# Patient Record
Sex: Female | Born: 1945 | Race: White | Hispanic: No | Marital: Married | Smoking: Former smoker
Health system: Southern US, Community
[De-identification: ages and names within clinical notes are randomized; demographics above are authoritative.]

## PROBLEM LIST (undated history)

## (undated) DIAGNOSIS — F32A Depression, unspecified: Secondary | ICD-10-CM

## (undated) DIAGNOSIS — I2510 Atherosclerotic heart disease of native coronary artery without angina pectoris: Secondary | ICD-10-CM

## (undated) DIAGNOSIS — I350 Nonrheumatic aortic (valve) stenosis: Secondary | ICD-10-CM

## (undated) DIAGNOSIS — I340 Nonrheumatic mitral (valve) insufficiency: Secondary | ICD-10-CM

## (undated) DIAGNOSIS — Z952 Presence of prosthetic heart valve: Secondary | ICD-10-CM

## (undated) DIAGNOSIS — E7800 Pure hypercholesterolemia, unspecified: Secondary | ICD-10-CM

## (undated) DIAGNOSIS — K5792 Diverticulitis of intestine, part unspecified, without perforation or abscess without bleeding: Secondary | ICD-10-CM

## (undated) DIAGNOSIS — R002 Palpitations: Secondary | ICD-10-CM

## (undated) DIAGNOSIS — E785 Hyperlipidemia, unspecified: Secondary | ICD-10-CM

## (undated) DIAGNOSIS — E079 Disorder of thyroid, unspecified: Secondary | ICD-10-CM

## (undated) DIAGNOSIS — M1990 Unspecified osteoarthritis, unspecified site: Secondary | ICD-10-CM

## (undated) DIAGNOSIS — M8580 Other specified disorders of bone density and structure, unspecified site: Secondary | ICD-10-CM

## (undated) DIAGNOSIS — D249 Benign neoplasm of unspecified breast: Secondary | ICD-10-CM

## (undated) DIAGNOSIS — I351 Nonrheumatic aortic (valve) insufficiency: Secondary | ICD-10-CM

## (undated) DIAGNOSIS — F329 Major depressive disorder, single episode, unspecified: Secondary | ICD-10-CM

## (undated) DIAGNOSIS — F419 Anxiety disorder, unspecified: Secondary | ICD-10-CM

## (undated) DIAGNOSIS — R011 Cardiac murmur, unspecified: Secondary | ICD-10-CM

## (undated) HISTORY — DX: Diverticulitis of intestine, part unspecified, without perforation or abscess without bleeding: K57.92

## (undated) HISTORY — PX: TUBAL LIGATION: SHX77

## (undated) HISTORY — PX: CORONARY ANGIOPLASTY: SHX604

## (undated) HISTORY — DX: Other specified disorders of bone density and structure, unspecified site: M85.80

## (undated) HISTORY — PX: CHOLECYSTECTOMY: SHX55

## (undated) HISTORY — DX: Benign neoplasm of unspecified breast: D24.9

## (undated) HISTORY — PX: BREAST SURGERY: SHX581

## (undated) HISTORY — PX: FINGER SURGERY: SHX640

## (undated) HISTORY — PX: BOWEL RESECTION: SHX1257

## (undated) HISTORY — DX: Nonrheumatic mitral (valve) insufficiency: I34.0

## (undated) HISTORY — DX: Pure hypercholesterolemia, unspecified: E78.00

## (undated) HISTORY — PX: COLOSTOMY TAKEDOWN: SHX5258

## (undated) HISTORY — DX: Nonrheumatic aortic (valve) stenosis: I35.0

## (undated) HISTORY — DX: Atherosclerotic heart disease of native coronary artery without angina pectoris: I25.10

## (undated) HISTORY — DX: Hyperlipidemia, unspecified: E78.5

## (undated) HISTORY — DX: Depression, unspecified: F32.A

## (undated) HISTORY — DX: Major depressive disorder, single episode, unspecified: F32.9

## (undated) HISTORY — DX: Nonrheumatic aortic (valve) insufficiency: I35.1

---
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
## 1998-09-07 ENCOUNTER — Other Ambulatory Visit: Admission: RE | Admit: 1998-09-07 | Discharge: 1998-09-07 | Payer: Self-pay | Admitting: Obstetrics and Gynecology

## 1999-09-11 ENCOUNTER — Other Ambulatory Visit: Admission: RE | Admit: 1999-09-11 | Discharge: 1999-09-11 | Payer: Self-pay | Admitting: Obstetrics and Gynecology

## 1999-10-09 ENCOUNTER — Other Ambulatory Visit: Admission: RE | Admit: 1999-10-09 | Discharge: 1999-10-09 | Payer: Self-pay | Admitting: Obstetrics and Gynecology

## 2000-09-11 ENCOUNTER — Other Ambulatory Visit: Admission: RE | Admit: 2000-09-11 | Discharge: 2000-09-11 | Payer: Self-pay | Admitting: Obstetrics and Gynecology

## 2001-09-12 ENCOUNTER — Other Ambulatory Visit: Admission: RE | Admit: 2001-09-12 | Discharge: 2001-09-12 | Payer: Self-pay | Admitting: Obstetrics and Gynecology

## 2002-09-15 ENCOUNTER — Other Ambulatory Visit: Admission: RE | Admit: 2002-09-15 | Discharge: 2002-09-15 | Payer: Self-pay | Admitting: Obstetrics and Gynecology

## 2003-10-06 ENCOUNTER — Other Ambulatory Visit: Admission: RE | Admit: 2003-10-06 | Discharge: 2003-10-06 | Payer: Self-pay | Admitting: Obstetrics and Gynecology

## 2004-10-09 ENCOUNTER — Other Ambulatory Visit: Admission: RE | Admit: 2004-10-09 | Discharge: 2004-10-09 | Payer: Self-pay | Admitting: Obstetrics and Gynecology

## 2005-10-10 ENCOUNTER — Other Ambulatory Visit: Admission: RE | Admit: 2005-10-10 | Discharge: 2005-10-10 | Payer: Self-pay | Admitting: Obstetrics and Gynecology

## 2006-10-14 ENCOUNTER — Other Ambulatory Visit: Admission: RE | Admit: 2006-10-14 | Discharge: 2006-10-14 | Payer: Self-pay | Admitting: Obstetrics and Gynecology

## 2007-10-15 ENCOUNTER — Other Ambulatory Visit: Admission: RE | Admit: 2007-10-15 | Discharge: 2007-10-15 | Payer: Self-pay | Admitting: Obstetrics and Gynecology

## 2008-10-20 ENCOUNTER — Ambulatory Visit: Payer: Self-pay | Admitting: Obstetrics and Gynecology

## 2008-10-20 ENCOUNTER — Encounter: Payer: Self-pay | Admitting: Obstetrics and Gynecology

## 2008-10-20 ENCOUNTER — Other Ambulatory Visit: Admission: RE | Admit: 2008-10-20 | Discharge: 2008-10-20 | Payer: Self-pay | Admitting: Obstetrics and Gynecology

## 2009-10-24 ENCOUNTER — Ambulatory Visit: Payer: Self-pay | Admitting: Obstetrics and Gynecology

## 2009-10-24 ENCOUNTER — Other Ambulatory Visit: Admission: RE | Admit: 2009-10-24 | Discharge: 2009-10-24 | Payer: Self-pay | Admitting: Obstetrics and Gynecology

## 2009-11-28 ENCOUNTER — Encounter: Admission: RE | Admit: 2009-11-28 | Discharge: 2009-11-28 | Payer: Self-pay | Admitting: General Surgery

## 2009-11-30 ENCOUNTER — Ambulatory Visit (HOSPITAL_BASED_OUTPATIENT_CLINIC_OR_DEPARTMENT_OTHER): Admission: RE | Admit: 2009-11-30 | Discharge: 2009-11-30 | Payer: Self-pay | Admitting: General Surgery

## 2010-10-02 LAB — BASIC METABOLIC PANEL
BUN: 11 mg/dL (ref 6–23)
Chloride: 105 mEq/L (ref 96–112)
Creatinine, Ser: 0.85 mg/dL (ref 0.4–1.2)
GFR calc Af Amer: >60 mL/min (ref >60)

## 2010-10-02 LAB — CBC
Hemoglobin: 13.4 g/dL (ref 12.0–15.0)
MCHC: 34.1 g/dL (ref 30.0–36.0)
MCV: 84.4 fL (ref 78.0–100.0)
Platelets: 175 10*3/uL (ref 150–400)
RBC: 4.67 MIL/uL (ref 3.87–5.11)
RDW: 14.6 % (ref 11.5–15.5)

## 2010-10-02 LAB — DIFFERENTIAL
Basophils Relative: 0 % (ref 0–1)
Eosinophils Relative: 3 % (ref 0–5)
Lymphocytes Relative: 25 % (ref 12–46)
Lymphs Abs: 2.1 10*3/uL (ref 0.7–4.0)
Monocytes Absolute: 0.6 10*3/uL (ref 0.1–1.0)
Neutro Abs: 5.1 10*3/uL (ref 1.7–7.7)
Neutrophils Relative %: 64 % (ref 43–77)

## 2010-11-01 ENCOUNTER — Encounter (INDEPENDENT_AMBULATORY_CARE_PROVIDER_SITE_OTHER): Payer: BC Managed Care – PPO | Admitting: Obstetrics and Gynecology

## 2010-11-01 ENCOUNTER — Other Ambulatory Visit (HOSPITAL_COMMUNITY)
Admission: RE | Admit: 2010-11-01 | Discharge: 2010-11-01 | Disposition: A | Discharge status: Home / Self Care | Payer: BC Managed Care – PPO | Source: Ambulatory Visit | Attending: Obstetrics and Gynecology | Admitting: Obstetrics and Gynecology

## 2010-11-01 ENCOUNTER — Other Ambulatory Visit: Payer: Self-pay | Admitting: Obstetrics and Gynecology

## 2010-11-01 DIAGNOSIS — Z833 Family history of diabetes mellitus: Secondary | ICD-10-CM

## 2010-11-01 DIAGNOSIS — E559 Vitamin D deficiency, unspecified: Secondary | ICD-10-CM

## 2010-11-01 DIAGNOSIS — Z01419 Encounter for gynecological examination (general) (routine) without abnormal findings: Secondary | ICD-10-CM

## 2010-11-01 DIAGNOSIS — Z124 Encounter for screening for malignant neoplasm of cervix: Secondary | ICD-10-CM | POA: Insufficient documentation

## 2010-11-17 ENCOUNTER — Other Ambulatory Visit: Payer: BC Managed Care – PPO

## 2011-10-22 ENCOUNTER — Other Ambulatory Visit: Payer: Self-pay | Admitting: *Deleted

## 2011-10-22 DIAGNOSIS — M8580 Other specified disorders of bone density and structure, unspecified site: Secondary | ICD-10-CM

## 2011-10-26 ENCOUNTER — Encounter: Payer: Self-pay | Admitting: Gynecology

## 2011-10-26 DIAGNOSIS — I340 Nonrheumatic mitral (valve) insufficiency: Secondary | ICD-10-CM | POA: Insufficient documentation

## 2011-10-26 DIAGNOSIS — D249 Benign neoplasm of unspecified breast: Secondary | ICD-10-CM | POA: Insufficient documentation

## 2011-10-26 DIAGNOSIS — I351 Nonrheumatic aortic (valve) insufficiency: Secondary | ICD-10-CM | POA: Insufficient documentation

## 2011-10-26 DIAGNOSIS — K5792 Diverticulitis of intestine, part unspecified, without perforation or abscess without bleeding: Secondary | ICD-10-CM | POA: Insufficient documentation

## 2011-10-26 DIAGNOSIS — M8580 Other specified disorders of bone density and structure, unspecified site: Secondary | ICD-10-CM | POA: Insufficient documentation

## 2011-10-26 DIAGNOSIS — F329 Major depressive disorder, single episode, unspecified: Secondary | ICD-10-CM | POA: Insufficient documentation

## 2011-11-07 ENCOUNTER — Other Ambulatory Visit (HOSPITAL_COMMUNITY)
Admission: RE | Admit: 2011-11-07 | Discharge: 2011-11-07 | Disposition: A | Discharge status: Home / Self Care | Payer: BC Managed Care – PPO | Source: Ambulatory Visit | Attending: Obstetrics and Gynecology | Admitting: Obstetrics and Gynecology

## 2011-11-07 ENCOUNTER — Encounter: Payer: Self-pay | Admitting: Obstetrics and Gynecology

## 2011-11-07 ENCOUNTER — Ambulatory Visit (INDEPENDENT_AMBULATORY_CARE_PROVIDER_SITE_OTHER): Payer: BC Managed Care – PPO | Admitting: Obstetrics and Gynecology

## 2011-11-07 VITALS — BP 124/80 | Ht 59.0 in | Wt 215.0 lb

## 2011-11-07 DIAGNOSIS — E7800 Pure hypercholesterolemia, unspecified: Secondary | ICD-10-CM | POA: Insufficient documentation

## 2011-11-07 DIAGNOSIS — Z01419 Encounter for gynecological examination (general) (routine) without abnormal findings: Secondary | ICD-10-CM | POA: Insufficient documentation

## 2011-11-07 NOTE — Progress Notes (Signed)
Patient came to see me today for her annual GYN exam. She is doing well without HRT. She is having no vaginal bleeding. She is having no pelvic pain. She does her lab through her PCP. She had a borderline sugar here last year. She has had 2 normal test since then. She just had a normal mammogram. She had a bone density but the report is not back yet. She is not having any GYN problems.  Physical examination: Kennon Portela present HEENT within normal limits. Neck: Thyroid not large. No masses. Supraclavicular nodes: not enlarged. Breasts: Examined in both sitting and lying  position. No skin changes and no masses. Abdomen: Soft no guarding rebound or masses or hernia. Pelvic: External: Within normal limits. BUS: Within normal limits. Vaginal:within normal limits. Good estrogen effect. No evidence of cystocele rectocele or enterocele. Cervix: clean. Uterus: Normal size and shape. Adnexa: No masses. Rectovaginal exam: Confirmatory and negative. Extremities: Within normal limits.  Assessment: Normal GYN exam  Plan: Continue yearly mammograms.

## 2011-11-07 NOTE — Progress Notes (Signed)
Addended by: Dayna Barker on: 11/07/2011 02:29 PM   Modules accepted: Orders

## 2011-11-08 ENCOUNTER — Encounter: Payer: Self-pay | Admitting: Obstetrics and Gynecology

## 2011-11-08 LAB — URINALYSIS W MICROSCOPIC + REFLEX CULTURE
Bilirubin Urine: NEGATIVE
Hgb urine dipstick: NEGATIVE
Ketones, ur: NEGATIVE mg/dL
Protein, ur: NEGATIVE mg/dL
Urobilinogen, UA: 0.2 mg/dL (ref 0.0–1.0)

## 2012-11-10 ENCOUNTER — Encounter: Payer: Self-pay | Admitting: Gynecology

## 2012-11-12 ENCOUNTER — Ambulatory Visit (INDEPENDENT_AMBULATORY_CARE_PROVIDER_SITE_OTHER): Payer: Medicare Other | Admitting: Gynecology

## 2012-11-12 ENCOUNTER — Encounter: Payer: Self-pay | Admitting: Gynecology

## 2012-11-12 VITALS — BP 128/86 | Ht <= 58 in | Wt 218.0 lb

## 2012-11-12 DIAGNOSIS — M8580 Other specified disorders of bone density and structure, unspecified site: Secondary | ICD-10-CM

## 2012-11-12 DIAGNOSIS — Z01419 Encounter for gynecological examination (general) (routine) without abnormal findings: Secondary | ICD-10-CM

## 2012-11-12 DIAGNOSIS — N952 Postmenopausal atrophic vaginitis: Secondary | ICD-10-CM | POA: Insufficient documentation

## 2012-11-12 DIAGNOSIS — Z8349 Family history of other endocrine, nutritional and metabolic diseases: Secondary | ICD-10-CM

## 2012-11-12 DIAGNOSIS — Z8639 Personal history of other endocrine, nutritional and metabolic disease: Secondary | ICD-10-CM

## 2012-11-12 DIAGNOSIS — M899 Disorder of bone, unspecified: Secondary | ICD-10-CM

## 2012-11-12 DIAGNOSIS — Z79890 Hormone replacement therapy (postmenopausal): Secondary | ICD-10-CM

## 2012-11-12 DIAGNOSIS — Z1159 Encounter for screening for other viral diseases: Secondary | ICD-10-CM

## 2012-11-12 HISTORY — DX: Postmenopausal atrophic vaginitis: N95.2

## 2012-11-12 HISTORY — DX: Family history of other endocrine, nutritional and metabolic diseases: Z83.49

## 2012-11-12 MED ORDER — NONFORMULARY OR COMPOUNDED ITEM: Status: DC

## 2012-11-12 NOTE — Patient Instructions (Addendum)

## 2012-11-12 NOTE — Progress Notes (Signed)
Tammy Hunt [birth year] [id number]   History:    67 y.o.  for annual gyn exam with no major complaints today. She did want to have her thyroid checked because she has a strong family history of thyroid disease. She is being followed by her cardiologist in [place], [place].  For her history of aortic stenosis. Her primary physician is also Dr. Jeanie Sewer who has been doing her lab work. Patient 2008 had history of bowel resection as a result of diverticulitis she also had a diverting colostomy that was reversed. Patient has history of osteopenia and past history of a vitamin D deficiency. Her last bone density study was in 2013 with the lowest T score of -1.8 at the right femoral neck. She had a mammogram this year which was normal. Her last colonoscopy was in 2010 by Dr. Chales Abrahams in [place]. [place]. Patient with no past history of abnormal Pap smears. Her shingles vaccine and Tdap are up-to-date.  Past medical history,surgical history, family history and social history were all reviewed and documented in the EPIC chart.  Gynecologic History No LMP recorded. Patient is postmenopausal. Contraception: post menopausal status Last Pap: 2013. Results were: normal Last mammogram: 2014. Results were: normal  Obstetric History OB History   Grav Para Term Preterm Abortions TAB SAB Ect Mult Living   2 1 1  1     1      # Outc Date GA Lbr Len/2nd Wgt Sex Del Anes PTL Lv   1 TRM            2 ABT                ROS: A ROS was performed and pertinent positives and negatives are included in the history.  GENERAL: No fevers or chills. HEENT: No change in vision, no earache, sore throat or sinus congestion. NECK: No pain or stiffness. CARDIOVASCULAR: No chest pain or pressure. No palpitations. PULMONARY: No shortness of breath, cough or wheeze. GASTROINTESTINAL: No abdominal pain, nausea, vomiting or diarrhea, melena or bright red blood per rectum. GENITOURINARY: No urinary frequency, urgency, hesitancy or  dysuria. MUSCULOSKELETAL: No joint or muscle pain, no back pain, no recent trauma. DERMATOLOGIC: No rash, no itching, no lesions. ENDOCRINE: No polyuria, polydipsia, no heat or cold intolerance. No recent change in weight. HEMATOLOGICAL: No anemia or easy bruising or bleeding. NEUROLOGIC: No headache, seizures, numbness, tingling or weakness. PSYCHIATRIC: No depression, no loss of interest in normal activity or change in sleep pattern.     Exam: chaperone present  BP 128/86  Ht 4\' 10"  (1.473 m)  Wt 218 lb (98.884 kg)  BMI 45.57 kg/m2  Body mass index is 45.57 kg/(m^2).  General appearance : Well developed well nourished female. No acute distress HEENT: Neck supple, trachea midline, no carotid bruits, no thyroidmegaly Lungs: Clear to auscultation, no rhonchi or wheezes, or rib retractions  Heart: Regular rate and rhythm, no murmurs or gallops Breast:Examined in sitting and supine position were symmetrical in appearance, no palpable masses or tenderness,  no skin retraction, no nipple inversion, no nipple discharge, no skin discoloration, no axillary or supraclavicular lymphadenopathy Abdomen: no palpable masses or tenderness, no rebound or guarding Extremities: no edema or skin discoloration or tenderness  Pelvic:  Bartholin, Urethra, Skene Glands: Within normal limits             Vagina: No gross lesions or discharge  Cervix: No gross lesions or discharge  Uterus  anteverted, normal size, shape and consistency, non-tender and  mobile  Adnexa  Without masses or tenderness  Anus and perineum  normal   Rectovaginal  normal sphincter tone without palpated masses or tenderness             Hemoccult cards provided     Assessment/Plan:  67 y.o. female for annual exam who is not sure she had the Tdap vaccine or not the will check with her primary physician.we will check her vitamin D level today because of her past history vitamin D deficiency in check her TSH because of family history of  thyroid disease.a urinalysis is also obtained today.  New CDC guidelines is recommending patients be tested once in her lifetime for hepatitis C antibody who were born between [birth year] through 1964. This was discussed with the patient today and has agreed to be tested today.  She was reminded for her to do the monthly self breast examination. We discussed importance of calcium vitamin D for osteoporosis prevention. She will need a bone density study next year. No Pap smear done today new guidelines discussed.   Ok Edwards MD, 5:35 PM 11/12/2012

## 2012-11-13 ENCOUNTER — Encounter: Payer: Self-pay | Admitting: Obstetrics and Gynecology

## 2012-11-13 ENCOUNTER — Other Ambulatory Visit: Payer: Self-pay | Admitting: Gynecology

## 2012-11-13 DIAGNOSIS — R7989 Other specified abnormal findings of blood chemistry: Secondary | ICD-10-CM

## 2012-11-13 LAB — HEPATITIS C ANTIBODY: HCV Ab: NEGATIVE

## 2012-11-14 ENCOUNTER — Other Ambulatory Visit: Payer: Medicare Other

## 2012-11-14 DIAGNOSIS — R7989 Other specified abnormal findings of blood chemistry: Secondary | ICD-10-CM

## 2012-11-15 LAB — THYROID PANEL WITH TSH
T3 Uptake: 27.4 % (ref 22.5–37.0)
T4, Total: 7.7 ug/dL (ref 5.0–12.5)
TSH: 5.552 u[IU]/mL — ABNORMAL HIGH (ref 0.350–4.500)

## 2012-11-17 ENCOUNTER — Other Ambulatory Visit: Payer: Self-pay | Admitting: Gynecology

## 2012-11-17 DIAGNOSIS — E039 Hypothyroidism, unspecified: Secondary | ICD-10-CM

## 2012-12-11 ENCOUNTER — Other Ambulatory Visit: Payer: Self-pay | Admitting: Anesthesiology

## 2012-12-11 DIAGNOSIS — Z1211 Encounter for screening for malignant neoplasm of colon: Secondary | ICD-10-CM

## 2013-02-18 ENCOUNTER — Other Ambulatory Visit: Payer: Self-pay

## 2013-02-19 ENCOUNTER — Encounter: Payer: Self-pay | Admitting: Gynecology

## 2013-05-21 ENCOUNTER — Other Ambulatory Visit: Payer: Self-pay

## 2013-07-16 HISTORY — PX: BIOPSY THYROID: PRO38

## 2013-07-22 ENCOUNTER — Encounter: Payer: Self-pay | Admitting: Endocrinology

## 2013-07-22 ENCOUNTER — Ambulatory Visit (INDEPENDENT_AMBULATORY_CARE_PROVIDER_SITE_OTHER): Payer: 59 | Admitting: Endocrinology

## 2013-07-22 VITALS — BP 128/80 | HR 90 | Temp 98.3°F | Ht <= 58 in | Wt 218.0 lb

## 2013-07-22 DIAGNOSIS — E215 Disorder of parathyroid gland, unspecified: Secondary | ICD-10-CM

## 2013-07-22 DIAGNOSIS — E042 Nontoxic multinodular goiter: Secondary | ICD-10-CM

## 2013-07-22 HISTORY — DX: Nontoxic multinodular goiter: E04.2

## 2013-07-22 HISTORY — DX: Disorder of parathyroid gland, unspecified: E21.5

## 2013-07-22 NOTE — Progress Notes (Signed)
Subjective:    Patient ID: Tammy [place], female    DOB: 1945/12/18, 67 y.o.   MRN: [id number]  HPI In early 2014, pt was noted on labs to have elevated TSH.  She has never been on rx for this. He has never taken non-prescribed thyroid hormone therapy.  He has never taken kelp or any other type of non-prescribed thyroid product.  she has never had thyroid imaging.  She is not considering a pregnancy.  she has never had thyroid surgery, or XRT to the neck.  He has never been on amiodarone or lithium.  She was also recently noted to have a slight nodule at the right anterior neck, but no assoc pain.   Past Medical History  Diagnosis Date  . Osteopenia   . Aortic insufficiency   . Mitral valve regurgitation   . Depression   . Diverticulitis   . Papilloma of breast   . Elevated cholesterol     Past Surgical History  Procedure Laterality Date  . Cesarean section    . Cholecystectomy    . Tubal ligation    . Finger surgery    . Bowel resection    . Colostomy takedown      History   Social History  . Marital Status: Married    Spouse Name: N/A    Number of Children: N/A  . Years of Education: N/A   Occupational History  . Not on file.   Social History Main Topics  . Smoking status: Former Research scientist (life sciences)  . Smokeless tobacco: Never Used  . Alcohol Use: No  . Drug Use: Not on file  . Sexual Activity: No   Other Topics Concern  . Not on file   Social History Narrative  . No narrative on file    Current Outpatient Prescriptions on File Prior to Visit  Medication Sig Dispense Refill  . aspirin 81 MG tablet Take 81 mg by mouth daily.      Marland Kitchen buPROPion (WELLBUTRIN XL) 300 MG 24 hr tablet Take 300 mg by mouth daily.      . Calcium Carbonate-Vitamin D (CALCIUM + D PO) Take by mouth.      . Cholecalciferol (VITAMIN D PO) Take by mouth.      . Omega-3 Fatty Acids (FISH OIL PO) Take by mouth.      . rosuvastatin (CRESTOR) 20 MG tablet Take 20 mg by mouth daily.      . NONFORMULARY  OR COMPOUNDED ITEM Estradiol .02% 1 ML Prefilled Applicator Sig: apply vaginally twice a week #90 Day Supply with 4 refills  1 each  4   No current facility-administered medications on file prior to visit.    No Known Allergies  Family History  Problem Relation Age of Onset  . Lung cancer Mother   . Cancer Mother     lung-smoker  . Heart disease Father   . Breast cancer Maternal Aunt     age 25  . Cancer Maternal Aunt     lymphoma  . Cancer Maternal Uncle     lung  . Cancer Maternal Grandmother     liver  mother had a benign goiter.  BP 128/80  Pulse 90  Temp(Src) 98.3 F (36.8 C) (Oral)  Ht 4\' 10"  (1.473 m)  Wt 218 lb (98.884 kg)  BMI 45.57 kg/m2  SpO2 96%  Review of Systems denies hair loss, sob, weight gain, memory loss, constipation, numbness, blurry vision, arthralgias, easy bruising, and syncope.  Depression is  well-controlled.  She has leg cramps, rhinorrhea, and dry skin.     Objective:   Physical Exam VS: see vs page GEN: no distress HEAD: head: no deformity eyes: no periorbital swelling, no proptosis external nose and ears are normal mouth: no lesion seen NECK: supple, thyroid is not enlarged.  i cannot feel the nodules noted on [name]. CHEST WALL: no deformity LUNGS:  Clear to auscultation CV: reg rate and rhythm; systolic murmur ABD: abdomen is soft, nontender.  no hepatosplenomegaly.  not distended.  no hernia MUSCULOSKELETAL: muscle bulk and strength are grossly normal.  no obvious joint swelling.  gait is normal and steady.   EXTEMITIES: no deformity.  no edema.   PULSES: bilat carotid bruits, vs transmitted murmur. NEURO:  cn 2-12 grossly intact.   readily moves all 4's.  sensation is intact to touch on all 4's SKIN:  Normal texture and temperature.  No rash or suspicious lesion is visible.   NODES:  None palpable at the neck PSYCH: alert, well-oriented.  Does not appear anxious nor depressed.   outside test results are reviewed: TSH=3.9 (i  also reviewed [name] report)    Assessment & Plan:  Multinodular goiter: ? Of parathyroid was raised on 1 of the nodules Depression: not thyroid-related Leg cramps: not thyroid-related

## 2013-07-22 NOTE — Patient Instructions (Addendum)
Please have a biopsy of the largest thyroid nodule.  Refer to a specialist.  you will receive a phone call, about a day and time for an appointment.   If no cancer is seen, please come back for a follow-up appointment in 6 months.   blood tests are also being requested for you today.  We'll contact you with results.

## 2013-07-23 LAB — PTH, INTACT AND CALCIUM
Calcium: 9.5 mg/dL (ref 8.4–10.5)
PTH: 48.4 pg/mL (ref 14.0–72.0)

## 2013-08-03 ENCOUNTER — Encounter: Payer: Self-pay | Admitting: Endocrinology

## 2013-08-14 ENCOUNTER — Telehealth: Payer: Self-pay | Admitting: Endocrinology

## 2013-08-14 NOTE — Telephone Encounter (Signed)
please call patient: Biopsy: no cancer is seen--good. Please come back for a follow-up appointment in 6 months

## 2013-08-14 NOTE — Telephone Encounter (Signed)
Pt informed and instructed to make a follow up appointment in 6 months.

## 2013-11-18 ENCOUNTER — Encounter: Payer: Medicare Other | Admitting: Gynecology

## 2013-12-02 ENCOUNTER — Encounter: Payer: Self-pay | Admitting: Gynecology

## 2013-12-02 ENCOUNTER — Ambulatory Visit (INDEPENDENT_AMBULATORY_CARE_PROVIDER_SITE_OTHER): Payer: Medicare Other | Admitting: Gynecology

## 2013-12-02 VITALS — BP 126/84 | Ht 58.5 in | Wt 228.0 lb

## 2013-12-02 DIAGNOSIS — Z01419 Encounter for gynecological examination (general) (routine) without abnormal findings: Secondary | ICD-10-CM

## 2013-12-02 DIAGNOSIS — M899 Disorder of bone, unspecified: Secondary | ICD-10-CM

## 2013-12-02 DIAGNOSIS — M949 Disorder of cartilage, unspecified: Secondary | ICD-10-CM

## 2013-12-02 DIAGNOSIS — N952 Postmenopausal atrophic vaginitis: Secondary | ICD-10-CM

## 2013-12-02 DIAGNOSIS — M8580 Other specified disorders of bone density and structure, unspecified site: Secondary | ICD-10-CM

## 2013-12-02 NOTE — Patient Instructions (Signed)
Replens vaginal moisturizer non hormonal over the counter purchase without prescription

## 2013-12-02 NOTE — Progress Notes (Signed)
Tammy [place] 24-Nov-1945 [id number]   History:    68 y.o.  for GYN exam and followup. Patient had been started on vaginal estrogen for vaginal atrophy last year but she stopped the medication because she thought that she was losing hair. Many years prior to that she had been on estrogen and progesterone for menopausal symptoms. She states that her symptoms now of vaginal dryness or minimal and she has had no further hair loss. Patient had a thyroid ultrasound with biopsy by her endocrinologist this year and nonspecific was the diagnosis. He is doing all her blood work as well. Patient is also being followed by her cardiologist in [place], [place]. because of her history of aortic stenosis.Her primary physician is also Dr. Lin Landsman who has been doing her lab work. Patient 2008 had history of bowel resection as a result of diverticulitis she also had a diverting colostomy that was reversed. Patient has history of osteopenia and past history of a vitamin D deficiency. Her last bone density study was in 2013 with the lowest T score of -1.8 at the right femoral neck. Her last colonoscopy was in 2010 by Dr. Lyndel Safe in [place]. Patient with no past history of abnormal Pap smears. Her shingles vaccine and Tdap are up-to-date.  Past medical history,surgical history, family history and social history were all reviewed and documented in the EPIC chart.  Gynecologic History No LMP recorded. Patient is postmenopausal. Contraception: post menopausal status Last Pap: 2013. Results were: normal Last mammogram: 2014. Results were: normal  Obstetric History OB History  Gravida Para Term Preterm AB SAB TAB Ectopic Multiple Living  2 1 1  1     1     # Outcome Date GA Lbr Len/2nd Weight Sex Delivery Anes PTL Lv  2 ABT           1 TRM                ROS: A ROS was performed and pertinent positives and negatives are included in the history.  GENERAL: No fevers or chills. HEENT: No change in vision,  no earache, sore throat or sinus congestion. NECK: No pain or stiffness. CARDIOVASCULAR: No chest pain or pressure. No palpitations. PULMONARY: No shortness of breath, cough or wheeze. GASTROINTESTINAL: No abdominal pain, nausea, vomiting or diarrhea, melena or bright red blood per rectum. GENITOURINARY: No urinary frequency, urgency, hesitancy or dysuria. MUSCULOSKELETAL: No joint or muscle pain, no back pain, no recent trauma. DERMATOLOGIC: No rash, no itching, no lesions. ENDOCRINE: No polyuria, polydipsia, no heat or cold intolerance. No recent change in weight. HEMATOLOGICAL: No anemia or easy bruising or bleeding. NEUROLOGIC: No headache, seizures, numbness, tingling or weakness. PSYCHIATRIC: No depression, no loss of interest in normal activity or change in sleep pattern.     Exam: chaperone present  BP 126/84  Ht 4' 10.5" (1.486 m)  Wt 228 lb (103.42 kg)  BMI 46.83 kg/m2  Body mass index is 46.83 kg/(m^2).  General appearance : Well developed well nourished female. No acute distress HEENT: Neck supple, trachea midline, no carotid bruits, no thyroidmegaly Lungs: Clear to auscultation, no rhonchi or wheezes, or rib retractions  Heart: Systolic ejection murmur grade 2/6  Breast:Examined in sitting and supine position were symmetrical in appearance, no palpable masses or tenderness,  no skin retraction, no nipple inversion, no nipple discharge, no skin discoloration, no axillary or supraclavicular lymphadenopathy Abdomen: no palpable masses or tenderness, no rebound or guarding Extremities: no edema or skin  discoloration or tenderness  Pelvic:  Bartholin, Urethra, Skene Glands: Within normal limits             Vagina: No gross lesions or discharge, vaginal atrophy  Cervix: No gross lesions or discharge  Uterus  axial, normal size, shape and consistency, non-tender and mobile  Adnexa  Without masses or tenderness  Anus and perineum  normal   Rectovaginal  normal sphincter tone without  palpated masses or tenderness             Hemoccult PCP provides     Assessment/Plan:  68 y.o. female for annual exam with mild vaginal atrophy. She will use over-the-counter Replens  As a vaginal moisturizer . Her PCP will be drawing  her blood work. Later this year she'll be due for her mammogram as well as her bone density study. She will also continue to follow with her cardiologist was monitoring her aortic stenosis. We discussed once again and calcium vitamin D and regular exercise for osteoporosis prevention. Pap smear not done today in accordance to the new guidelines.   Note: This dictation was prepared with  Dragon/digital dictation along withSmart phrase technology. Any transcriptional errors that result from this process are unintentional.   Terrance Mass MD, 3:30 PM 12/02/2013

## 2013-12-03 LAB — VITAMIN D 25 HYDROXY (VIT D DEFICIENCY, FRACTURES): VIT D 25 HYDROXY: 44 ng/mL (ref 30–89)

## 2013-12-10 ENCOUNTER — Other Ambulatory Visit: Payer: Self-pay | Admitting: *Deleted

## 2013-12-10 ENCOUNTER — Encounter: Payer: Self-pay | Admitting: Gynecology

## 2013-12-10 DIAGNOSIS — M8580 Other specified disorders of bone density and structure, unspecified site: Secondary | ICD-10-CM

## 2013-12-15 ENCOUNTER — Other Ambulatory Visit: Payer: Self-pay

## 2013-12-15 DIAGNOSIS — M8580 Other specified disorders of bone density and structure, unspecified site: Secondary | ICD-10-CM

## 2013-12-24 ENCOUNTER — Encounter: Payer: Self-pay | Admitting: Gynecology

## 2013-12-24 ENCOUNTER — Ambulatory Visit (INDEPENDENT_AMBULATORY_CARE_PROVIDER_SITE_OTHER): Payer: Medicare Other | Admitting: Gynecology

## 2013-12-24 VITALS — BP 132/88

## 2013-12-24 DIAGNOSIS — M949 Disorder of cartilage, unspecified: Secondary | ICD-10-CM

## 2013-12-24 DIAGNOSIS — M899 Disorder of bone, unspecified: Secondary | ICD-10-CM

## 2013-12-24 DIAGNOSIS — M8580 Other specified disorders of bone density and structure, unspecified site: Secondary | ICD-10-CM

## 2013-12-24 NOTE — Progress Notes (Signed)
   Patient presented to the office today to discuss her recent bone density study. Patient had been seen in the office on may 20 for her annual exam. Please refer to previous note for detail.  Bone density study done 12/09/2013 at [facility] radiology it demonstrated the following:  Lowest T- score right femoral neck with a value -2.4 FRAX Analysis demonstrated that her ten-year fracture risk of the hip was 1% and major osteoporotic fracture 5.1% both subthreshold for treatment.   They did describe that there was statistically significant interval decrease in bone mineral density: Left femur -6.9% Right femur -8.5% Patient with normal vitamin D level of 44 recently.  Assessment/plan: 68 year old patient with osteopenia. We discussed the importance of 1200 mg of calcium daily along with the addition of vitamin D3 2000 units daily. We also discussed importance of weightbearing exercises 45 minutes to an hour 3-4 times a week. We'll repeat her bone density study in 2 years.

## 2014-05-17 ENCOUNTER — Encounter: Payer: Self-pay | Admitting: Gynecology

## 2014-05-24 ENCOUNTER — Telehealth: Payer: Self-pay

## 2014-05-24 ENCOUNTER — Other Ambulatory Visit (INDEPENDENT_AMBULATORY_CARE_PROVIDER_SITE_OTHER): Payer: 59

## 2014-05-24 DIAGNOSIS — E042 Nontoxic multinodular goiter: Secondary | ICD-10-CM

## 2014-05-24 LAB — TSH: TSH: 4.39 u[IU]/mL (ref 0.35–4.50)

## 2014-05-24 LAB — T4, FREE: FREE T4: 0.79 ng/dL (ref 0.60–1.60)

## 2014-05-24 NOTE — Telephone Encounter (Signed)
Ok, i ordered 

## 2014-05-24 NOTE — Telephone Encounter (Signed)
Pt coming for lab appointment today at 145. What labs should be ordered? Thanks!

## 2014-07-26 HISTORY — PX: COLONOSCOPY: SHX174

## 2014-08-18 ENCOUNTER — Other Ambulatory Visit: Payer: Self-pay

## 2014-08-18 ENCOUNTER — Other Ambulatory Visit (INDEPENDENT_AMBULATORY_CARE_PROVIDER_SITE_OTHER): Payer: Medicare Other

## 2014-08-18 DIAGNOSIS — E042 Nontoxic multinodular goiter: Secondary | ICD-10-CM

## 2014-08-18 LAB — TSH: TSH: 6.34 u[IU]/mL — ABNORMAL HIGH (ref 0.35–4.50)

## 2014-08-18 LAB — T4, FREE: FREE T4: 0.72 ng/dL (ref 0.60–1.60)

## 2014-08-26 ENCOUNTER — Encounter: Payer: Self-pay | Admitting: Endocrinology

## 2014-08-26 ENCOUNTER — Ambulatory Visit (INDEPENDENT_AMBULATORY_CARE_PROVIDER_SITE_OTHER): Payer: Medicare Other | Admitting: Endocrinology

## 2014-08-26 VITALS — BP 132/78 | HR 93 | Temp 97.9°F | Ht 58.5 in | Wt 222.0 lb

## 2014-08-26 DIAGNOSIS — E042 Nontoxic multinodular goiter: Secondary | ICD-10-CM

## 2014-08-26 MED ORDER — LEVOTHYROXINE SODIUM 25 MCG PO TABS
25.0000 ug | ORAL_TABLET | Freq: Every day | ORAL | Status: DC
Start: 2014-08-26 — End: 2014-12-21

## 2014-08-26 NOTE — Patient Instructions (Addendum)
Please recheck the ultrasound, at [place].  you will receive a phone call, about a day and time for an appointment.   please come back for a follow-up appointment in 6 months.   i have sent a prescription to your pharmacy, for a small amount of thyroid hormone.

## 2014-08-26 NOTE — Progress Notes (Signed)
Subjective:    Patient ID: Tammy [place], female    DOB: 1946/05/22, 69 y.o.   MRN: [id number]  HPI  Pt returns for f/u of multinodular goiter and hypothyroidism (in early 2014, pt was noted on labs to have elevated TSH, but repeat was normal; she is not considering a pregnancy;  In late 2014, US showed multinodular goiter; in early 2015, Bx showed non-neoplastic goiter). She does not notice the nodule. Past Medical History  Diagnosis Date  . Osteopenia   . Aortic insufficiency   . Mitral valve regurgitation   . Depression   . Diverticulitis   . Papilloma of breast   . Elevated cholesterol     Past Surgical History  Procedure Laterality Date  . Cesarean section    . Cholecystectomy    . Tubal ligation    . Finger surgery    . Bowel resection    . Colostomy takedown    . Biopsy thyroid  JAN 2015    History   Social History  . Marital Status: Married    Spouse Name: N/A  . Number of Children: N/A  . Years of Education: N/A   Occupational History  . Not on file.   Social History Main Topics  . Smoking status: Former Research scientist (life sciences)  . Smokeless tobacco: Never Used  . Alcohol Use: No  . Drug Use: Not on file  . Sexual Activity: No   Other Topics Concern  . Not on file   Social History Narrative    Current Outpatient Prescriptions on File Prior to Visit  Medication Sig Dispense Refill  . aspirin 81 MG tablet Take 81 mg by mouth daily.    Marland Kitchen buPROPion (WELLBUTRIN XL) 300 MG 24 hr tablet Take 300 mg by mouth daily.    . Calcium Carbonate-Vitamin D (CALCIUM + D PO) Take by mouth.    . Cholecalciferol (VITAMIN D PO) Take by mouth.    . Omega-3 Fatty Acids (FISH OIL PO) Take by mouth.    . rosuvastatin (CRESTOR) 20 MG tablet Take 20 mg by mouth daily.     No current facility-administered medications on file prior to visit.    No Known Allergies  Family History  Problem Relation Age of Onset  . Lung cancer Mother   . Cancer Mother     lung-smoker  . Heart disease  Father   . Breast cancer Maternal Aunt     age 81  . Cancer Maternal Aunt     lymphoma  . Cancer Maternal Uncle     lung  . Cancer Maternal Grandmother     liver    BP 132/78 mmHg  Pulse 93  Temp(Src) 97.9 F (36.6 C) (Oral)  Ht 4' 10.5" (1.486 m)  Wt 222 lb (100.699 kg)  BMI 45.60 kg/m2  SpO2 97%  Review of Systems Denies neck pain    Objective:   Physical Exam VITAL SIGNS:  See vs page GENERAL: no distress NECK: ? slight fullness at the right thyroid lobe areas, but i can't tell details.   Lab Results  Component Value Date   TSH 6.34* 08/18/2014   T4TOTAL 7.7 11/14/2012      Assessment & Plan:  Multinodular goiter, persistent, uncertain etiology Hypothyroidism, mild.  Uncertain relationship between this and the goiter.  Patient is advised the following: Patient Instructions  Please recheck the ultrasound, at [place].  you will receive a phone call, about a day and time for an appointment.   please come  back for a follow-up appointment in 6 months.   i have sent a prescription to your pharmacy, for a small amount of thyroid hormone.

## 2014-08-27 ENCOUNTER — Encounter: Payer: Self-pay | Admitting: Endocrinology

## 2014-09-06 ENCOUNTER — Encounter: Payer: Self-pay | Admitting: Endocrinology

## 2014-09-13 ENCOUNTER — Encounter: Payer: Self-pay | Admitting: Endocrinology

## 2014-09-13 ENCOUNTER — Telehealth: Payer: Self-pay | Admitting: Endocrinology

## 2014-09-13 NOTE — Telephone Encounter (Signed)
Pt notified via voice mail and My Chart her [name] has been scheduled for 09/16/2014 and to arrive at 1pm.

## 2014-09-13 NOTE — Telephone Encounter (Signed)
Please call patient, she would like to speak with Jinny Blossom   She will not be home after 3pm  Please call her tomorrow    Thank you

## 2014-09-17 ENCOUNTER — Telehealth: Payer: Self-pay | Admitting: Endocrinology

## 2014-09-17 NOTE — Telephone Encounter (Signed)
please call patient: No change--good. Please come back for a follow-up appointment in 6 months

## 2014-09-17 NOTE — Telephone Encounter (Signed)
Lvom advising of note below. Pt voiced understanding.

## 2014-11-03 ENCOUNTER — Encounter: Payer: Self-pay | Admitting: Endocrinology

## 2014-12-21 ENCOUNTER — Other Ambulatory Visit (HOSPITAL_COMMUNITY)
Admission: RE | Admit: 2014-12-21 | Discharge: 2014-12-21 | Disposition: A | Discharge status: Home / Self Care | Payer: Medicare Other | Source: Ambulatory Visit | Attending: Gynecology | Admitting: Gynecology

## 2014-12-21 ENCOUNTER — Encounter: Payer: Self-pay | Admitting: Gynecology

## 2014-12-21 ENCOUNTER — Ambulatory Visit (INDEPENDENT_AMBULATORY_CARE_PROVIDER_SITE_OTHER): Payer: Medicare Other | Admitting: Gynecology

## 2014-12-21 ENCOUNTER — Ambulatory Visit (INDEPENDENT_AMBULATORY_CARE_PROVIDER_SITE_OTHER): Payer: Medicare Other | Admitting: Endocrinology

## 2014-12-21 ENCOUNTER — Encounter: Payer: Self-pay | Admitting: Endocrinology

## 2014-12-21 VITALS — BP 134/88 | HR 65 | Temp 98.2°F | Ht 58.5 in | Wt 224.0 lb

## 2014-12-21 VITALS — BP 118/82 | Ht 59.0 in | Wt 225.0 lb

## 2014-12-21 DIAGNOSIS — E663 Overweight: Secondary | ICD-10-CM | POA: Diagnosis not present

## 2014-12-21 DIAGNOSIS — E039 Hypothyroidism, unspecified: Secondary | ICD-10-CM | POA: Diagnosis not present

## 2014-12-21 DIAGNOSIS — N942 Vaginismus: Secondary | ICD-10-CM | POA: Diagnosis not present

## 2014-12-21 DIAGNOSIS — M8580 Other specified disorders of bone density and structure, unspecified site: Secondary | ICD-10-CM | POA: Diagnosis not present

## 2014-12-21 DIAGNOSIS — Z124 Encounter for screening for malignant neoplasm of cervix: Secondary | ICD-10-CM

## 2014-12-21 DIAGNOSIS — Z8639 Personal history of other endocrine, nutritional and metabolic disease: Secondary | ICD-10-CM | POA: Insufficient documentation

## 2014-12-21 DIAGNOSIS — Z01419 Encounter for gynecological examination (general) (routine) without abnormal findings: Secondary | ICD-10-CM

## 2014-12-21 HISTORY — DX: Personal history of other endocrine, nutritional and metabolic disease: Z86.39

## 2014-12-21 HISTORY — DX: Hypothyroidism, unspecified: E03.9

## 2014-12-21 LAB — TSH: TSH: 5.25 u[IU]/mL — ABNORMAL HIGH (ref 0.35–4.50)

## 2014-12-21 MED ORDER — LEVOTHYROXINE SODIUM 50 MCG PO TABS: 50.0000 ug | ORAL_TABLET | Freq: Every day | ORAL | Status: DC

## 2014-12-21 NOTE — Progress Notes (Signed)
Subjective:    Patient ID: Tammy [place], female    DOB: 1945/12/16, 69 y.o.   MRN: [id number]  HPI Pt returns for f/u of multinodular goiter and hypothyroidism (in early 2014, pt was noted on labs to have elevated TSH, but repeat was normal; in late 2014, US showed multinodular goiter; in early 2015, Bx showed non-neoplastic goiter; f/u US in early 2016 was unchanged). She does not notice the nodule.  pt states she feels no different, and well in general. Past Medical History  Diagnosis Date  . Osteopenia   . Aortic insufficiency   . Mitral valve regurgitation   . Depression   . Diverticulitis   . Papilloma of breast   . Elevated cholesterol     Past Surgical History  Procedure Laterality Date  . Cesarean section    . Cholecystectomy    . Tubal ligation    . Finger surgery    . Bowel resection    . Colostomy takedown    . Biopsy thyroid  JAN 2015    History   Social History  . Marital Status: Married    Spouse Name: N/A  . Number of Children: N/A  . Years of Education: N/A   Occupational History  . Not on file.   Social History Main Topics  . Smoking status: Former Research scientist (life sciences)  . Smokeless tobacco: Never Used  . Alcohol Use: No  . Drug Use: Not on file  . Sexual Activity: No   Other Topics Concern  . Not on file   Social History Narrative    Current Outpatient Prescriptions on File Prior to Visit  Medication Sig Dispense Refill  . aspirin 81 MG tablet Take 81 mg by mouth daily.    Marland Kitchen buPROPion (WELLBUTRIN XL) 300 MG 24 hr tablet Take 300 mg by mouth daily.    . Calcium Carbonate-Vitamin D (CALCIUM + D PO) Take by mouth.    . Cholecalciferol (VITAMIN D PO) Take by mouth.    . levothyroxine (SYNTHROID, LEVOTHROID) 25 MCG tablet Take 1 tablet (25 mcg total) by mouth daily before breakfast. 90 tablet 3  . Omega-3 Fatty Acids (FISH OIL PO) Take by mouth.    . rosuvastatin (CRESTOR) 20 MG tablet Take 20 mg by mouth daily.    . TURMERIC PO Take by mouth.     No  current facility-administered medications on file prior to visit.    No Known Allergies  Family History  Problem Relation Age of Onset  . Lung cancer Mother   . Cancer Mother     lung-smoker  . Heart disease Father   . Breast cancer Maternal Aunt     age 105  . Cancer Maternal Aunt     lymphoma  . Cancer Maternal Uncle     lung  . Cancer Maternal Grandmother     liver    BP 134/88 mmHg  Pulse 65  Temp(Src) 98.2 F (36.8 C) (Oral)  Ht 4' 10.5" (1.486 m)  Wt 224 lb (101.606 kg)  BMI 46.01 kg/m2  SpO2 96%    Review of Systems Denies tremor    Objective:   Physical Exam VITAL SIGNS:  See vs page GENERAL: no distress NECK: slight fullness at the right anterior neck, but no nodule is palpable.    Lab Results  Component Value Date   TSH 5.25* 12/21/2014   T4TOTAL 7.7 11/14/2012       Assessment & Plan:  Hypothyroidism: she needs increased rx  Patient  is advised the following: Patient Instructions  blood tests are requested for you today.  We'll let you know about the results. Please come back for a follow-up appointment in 6 months. We'll recheck the thyroid ultrasound again in 1-2 years.    addendum: i have sent a prescription to your pharmacy, to increase synthroid.

## 2014-12-21 NOTE — Patient Instructions (Signed)

## 2014-12-21 NOTE — Addendum Note (Signed)
Addended by: Alen Blew on: 12/21/2014 03:53 PM   Modules accepted: Orders

## 2014-12-21 NOTE — Patient Instructions (Signed)
blood tests are requested for you today.  We'll let you know about the results. Please come back for a follow-up appointment in 6 months. We'll recheck the thyroid ultrasound again in 1-2 years.

## 2014-12-21 NOTE — Progress Notes (Signed)
Tammy Hunt 1946-06-27 [id number]   History:    69 y.o.  for annual gyn exam with no complaints today. Patient has a history of hypothyroidism which is being followed and treated by her endocrinologist Dr. Loanne Drilling. Her primary care physician is Dr. Lin Landsman who is been doing her blood work. Review of her records indicated she was last seen in the office to discuss her bone density study on 12/24/2013. The following had been discussed:  Bone density study done 12/09/2013 at [facility] radiology it demonstrated the following:  Lowest T- score right femoral neck with a value -2.4 FRAX Analysis demonstrated that her ten-year fracture risk of the hip was 1% and major osteoporotic fracture 5.1% both subthreshold for treatment.  They did describe that there was statistically significant interval decrease in bone mineral density: Left femur -6.9% Right femur -8.5% Patient with normal vitamin D level of 44 recently.  Patient has had history vitamin D deficiency in the past and she's currently taking her calcium and vitamin D. Patient many years ago had bowel resection as a result of ruptured diverticulitis. Her colonoscopy was in January of this year benign polyps were removed she is on a 3 year 3 cycle. Her mammogram was done today results pending. Patient states all her vaccines are up-to-date. Patient with no prior history of any abnormal Pap smears  Past medical history,surgical history, family history and social history were all reviewed and documented in the EPIC chart.  Gynecologic History No LMP recorded. Patient is postmenopausal. Contraception: post menopausal status Last Pap: 2013. Results were: normal Last mammogram: Today. Results were: Results pending  Obstetric History OB History  Gravida Para Term Preterm AB SAB TAB Ectopic Multiple Living  2 1 1  1     1     # Outcome Date GA Lbr Len/2nd Weight Sex Delivery Anes PTL Lv  2 AB           1 Term                ROS: A ROS was  performed and pertinent positives and negatives are included in the history.  GENERAL: No fevers or chills. HEENT: No change in vision, no earache, sore throat or sinus congestion. NECK: No pain or stiffness. CARDIOVASCULAR: No chest pain or pressure. No palpitations. PULMONARY: No shortness of breath, cough or wheeze. GASTROINTESTINAL: No abdominal pain, nausea, vomiting or diarrhea, melena or bright red blood per rectum. GENITOURINARY: No urinary frequency, urgency, hesitancy or dysuria. MUSCULOSKELETAL: No joint or muscle pain, no back pain, no recent trauma. DERMATOLOGIC: No rash, no itching, no lesions. ENDOCRINE: No polyuria, polydipsia, no heat or cold intolerance. No recent change in weight. HEMATOLOGICAL: No anemia or easy bruising or bleeding. NEUROLOGIC: No headache, seizures, numbness, tingling or weakness. PSYCHIATRIC: No depression, no loss of interest in normal activity or change in sleep pattern.     Exam: chaperone present  BP 118/82 mmHg  Ht 4\' 11"  (1.499 m)  Wt 225 lb (102.059 kg)  BMI 45.42 kg/m2  Body mass index is 45.42 kg/(m^2).  General appearance : Well developed well nourished female. No acute distress HEENT: Eyes: no retinal hemorrhage or exudates,  Neck supple, trachea midline, no carotid bruits, no thyroidmegaly Lungs: Clear to auscultation, no rhonchi or wheezes, or rib retractions  Heart: Regular rate and rhythm, no murmurs or gallops Breast:Examined in sitting and supine position were symmetrical in appearance, no palpable masses or tenderness,  no skin retraction, no nipple inversion, no nipple  discharge, no skin discoloration, no axillary or supraclavicular lymphadenopathy Abdomen: no palpable masses or tenderness, no rebound or guarding Extremities: no edema or skin discoloration or tenderness  Pelvic:  Bartholin, Urethra, Skene Glands: Within normal limits             Vagina: No gross lesions or discharge  Cervix: No gross lesions or discharge  Uterus   limited exam due to patient's abdominal girth and vaginismus,   Adnexa  same as above  Anus and perineum  normal   Rectovaginal  normal sphincter tone without palpated masses or tenderness             Hemoccult colonoscopy this year     Assessment/Plan:  69 y.o. female for annual exam who will return to the office in the next 1-2 weeks for pelvic ultrasound due to the fact that there was limitation on her pelvic exam due to her narrow vagina, vaginismus an abdominal girth. We did discuss new Pap smear guidelines. Since her last that there was 3 years ago we'll do the last Pap smear today and adhered to the new guidelines. Because of her history vitamin D deficiency in the past with vitamin D level today. Once again discussed importance of calcium vitamin D and regular exercise for osteoporosis prevention. Patient was schedule bone density study for next year. Her PCP will be doing the remainder of her blood work and her endocrinologist is following her thyroid function test. Mammogram was done today.   Terrance Mass MD, 3:28 PM 12/21/2014

## 2014-12-22 ENCOUNTER — Encounter: Payer: Self-pay | Admitting: Gynecology

## 2014-12-22 LAB — VITAMIN D 25 HYDROXY (VIT D DEFICIENCY, FRACTURES): Vit D, 25-Hydroxy: 36 ng/mL (ref 30–100)

## 2014-12-23 LAB — CYTOLOGY - PAP

## 2015-01-10 ENCOUNTER — Other Ambulatory Visit: Payer: Self-pay

## 2015-01-14 ENCOUNTER — Ambulatory Visit (INDEPENDENT_AMBULATORY_CARE_PROVIDER_SITE_OTHER): Payer: Medicare Other

## 2015-01-14 ENCOUNTER — Encounter: Payer: Self-pay | Admitting: Gynecology

## 2015-01-14 ENCOUNTER — Ambulatory Visit (INDEPENDENT_AMBULATORY_CARE_PROVIDER_SITE_OTHER): Payer: Medicare Other | Admitting: Gynecology

## 2015-01-14 DIAGNOSIS — N942 Vaginismus: Secondary | ICD-10-CM | POA: Diagnosis not present

## 2015-01-14 DIAGNOSIS — N840 Polyp of corpus uteri: Secondary | ICD-10-CM

## 2015-01-14 DIAGNOSIS — E663 Overweight: Secondary | ICD-10-CM | POA: Diagnosis not present

## 2015-01-14 HISTORY — DX: Polyp of corpus uteri: N84.0

## 2015-01-14 NOTE — Progress Notes (Signed)
   Patient is a 69 year old who was seen in the office for her annual exam on June 7. Due to her vaginismus and limited pelvic exam and ultrasound was ordered and this is the reason for today's visit. See last in 3 note for further details. She denied any vaginal bleeding. Patient with no past history of any abnormal Pap smears. Patient with past history vitamin D deficiency and a vitamin D level drawn recently was normal.  Ultrasound today: Uterus measured 5.3 x 4.5 x 3.6 cm with endometrial stripe of 6.7 mm. Patient with 2 intramural fibroids largest 1 measuring 27 x 19 x 22 mm. A slight amount of slightly echogenic fluid was seen in the endometrium questionable small polyp 5 x 4 mm. Ovaries appear to be normal otherwise. No adnexal masses.  Assessment for/plan: Patient with 2 small fibroids small questionable polyp. Patient with no vaginal bleeding. Patient on no hormone replacement therapy. Patient will return back to the office in 3 months for follow-up sonohysterogram. Patient will return sooner if she has any vaginal bleeding.

## 2015-01-19 ENCOUNTER — Encounter: Payer: Self-pay | Admitting: Endocrinology

## 2015-01-19 ENCOUNTER — Other Ambulatory Visit: Payer: Self-pay | Admitting: Endocrinology

## 2015-01-19 MED ORDER — LEVOTHYROXINE SODIUM 50 MCG PO TABS: 50.0000 ug | ORAL_TABLET | Freq: Every day | ORAL | Status: DC

## 2015-02-16 DIAGNOSIS — I351 Nonrheumatic aortic (valve) insufficiency: Secondary | ICD-10-CM | POA: Insufficient documentation

## 2015-02-16 DIAGNOSIS — I350 Nonrheumatic aortic (valve) stenosis: Secondary | ICD-10-CM | POA: Insufficient documentation

## 2015-02-16 DIAGNOSIS — E785 Hyperlipidemia, unspecified: Secondary | ICD-10-CM | POA: Insufficient documentation

## 2015-02-16 DIAGNOSIS — I340 Nonrheumatic mitral (valve) insufficiency: Secondary | ICD-10-CM

## 2015-02-16 HISTORY — DX: Nonrheumatic aortic (valve) stenosis: I35.0

## 2015-02-16 HISTORY — DX: Nonrheumatic aortic (valve) insufficiency: I35.1

## 2015-02-16 HISTORY — DX: Nonrheumatic mitral (valve) insufficiency: I34.0

## 2015-03-15 ENCOUNTER — Other Ambulatory Visit: Payer: Self-pay | Admitting: Gynecology

## 2015-03-15 DIAGNOSIS — N840 Polyp of corpus uteri: Secondary | ICD-10-CM

## 2015-04-19 ENCOUNTER — Telehealth: Payer: Self-pay | Admitting: Gynecology

## 2015-04-19 NOTE — Telephone Encounter (Signed)
04/19/15-Pt was advised today that she has a $40 copay for her upcoming sonohysterogram. [employer] [id number]

## 2015-04-22 ENCOUNTER — Ambulatory Visit: Payer: Medicare Other | Admitting: Gynecology

## 2015-04-22 ENCOUNTER — Other Ambulatory Visit: Payer: Medicare Other

## 2015-04-25 ENCOUNTER — Ambulatory Visit: Payer: Medicare Other | Admitting: Gynecology

## 2015-04-25 ENCOUNTER — Other Ambulatory Visit: Payer: Medicare Other

## 2015-04-27 ENCOUNTER — Other Ambulatory Visit: Payer: Self-pay | Admitting: Gynecology

## 2015-04-27 ENCOUNTER — Encounter: Payer: Self-pay | Admitting: Gynecology

## 2015-04-27 ENCOUNTER — Ambulatory Visit (INDEPENDENT_AMBULATORY_CARE_PROVIDER_SITE_OTHER): Payer: Medicare Other | Admitting: Gynecology

## 2015-04-27 ENCOUNTER — Ambulatory Visit (INDEPENDENT_AMBULATORY_CARE_PROVIDER_SITE_OTHER): Payer: Medicare Other

## 2015-04-27 DIAGNOSIS — N840 Polyp of corpus uteri: Secondary | ICD-10-CM | POA: Diagnosis not present

## 2015-04-27 DIAGNOSIS — R9389 Abnormal findings on diagnostic imaging of other specified body structures: Secondary | ICD-10-CM

## 2015-04-27 DIAGNOSIS — D252 Subserosal leiomyoma of uterus: Secondary | ICD-10-CM

## 2015-04-27 DIAGNOSIS — D251 Intramural leiomyoma of uterus: Secondary | ICD-10-CM

## 2015-04-27 DIAGNOSIS — R938 Abnormal findings on diagnostic imaging of other specified body structures: Secondary | ICD-10-CM

## 2015-04-27 DIAGNOSIS — N83339 Acquired atrophy of ovary and fallopian tube, unspecified side: Secondary | ICD-10-CM

## 2015-04-27 HISTORY — DX: Intramural leiomyoma of uterus: D25.1

## 2015-04-27 NOTE — Progress Notes (Signed)
   Patient is a 69 year old who was last seen the office on July 1 and is here for follow-up ultrasound and sonohysterogram as a result of the following:  "Due to her vaginismus and limited pelvic exam and ultrasound was ordered on 01/14/2015 which demonstrated the following: Uterus measured 5.3 x 4.5 x 3.6 cm with endometrial stripe of 6.7 mm. Patient with 2 intramural fibroids largest 1 measuring 27 x 19 x 22 mm. A slight amount of slightly echogenic fluid was seen in the endometrium questionable small polyp 5 x 4 mm. Ovaries appear to be normal otherwise. No adnexal masses.  Ultrasound today demonstrated the following: Uterus measures 6.5 x 5.0 x 3.2 cm with endometrial stripe of 3.1 mm patient with several small intramural and subserosal fibroids the largest one measuring 15 x 20 mm. Fluid-filled endometrial cavity with a questionable small fundal defect right and left ovary were normal and atrophic. There was no fluid in the cul-de-sac. The cervix with an cleansed with Betadine solution following injection of normal saline and echogenic 5 x 5 mm defect was seen in the left uterine wall. An endometrial biopsy with a sterile Pipelle was obtained and segment of histological evaluation.  Assessment/plan: Questionable small endometrial polyp fundus of the uterus endometrial biopsy done today. No vaginal bleeding. Patient on no hormone replacement therapy. Otherwise patient to return back in 6 months for follow-up scan.

## 2015-06-24 ENCOUNTER — Encounter: Payer: Self-pay | Admitting: Endocrinology

## 2015-06-24 ENCOUNTER — Ambulatory Visit (INDEPENDENT_AMBULATORY_CARE_PROVIDER_SITE_OTHER): Payer: Medicare Other | Admitting: Endocrinology

## 2015-06-24 VITALS — BP 146/92 | HR 75 | Temp 98.2°F | Ht 59.0 in | Wt 224.0 lb

## 2015-06-24 DIAGNOSIS — E039 Hypothyroidism, unspecified: Secondary | ICD-10-CM

## 2015-06-24 LAB — TSH: TSH: 4.23 u[IU]/mL (ref 0.35–4.50)

## 2015-06-24 NOTE — Patient Instructions (Addendum)
blood tests are requested for you today.  We'll let you know about the results.   Please come back for a follow-up appointment in 1 year, when you will be due to recheck the ultrasound.

## 2015-06-24 NOTE — Progress Notes (Signed)
Subjective:    Patient ID: Tammy Hunt, female    DOB: [birth year], 69 y.o.   MRN: [id number]  HPI Pt returns for f/u of multinodular goiter and hypothyroidism (in early 2014, pt was noted on labs to have elevated TSH, but repeat was normal; in late 2014, repeat TSH was high, so synthroid was started; also in late 2014, US showed multinodular goiter; in early 2015, Bx showed non-neoplastic goiter; f/u US in early 2016 was unchanged). She does not notice the nodule.  pt states she feels no different, and well in general.   Past Medical History  Diagnosis Date  . Osteopenia   . Aortic insufficiency   . Mitral valve regurgitation   . Depression   . Diverticulitis   . Papilloma of breast   . Elevated cholesterol   . High cholesterol     Past Surgical History  Procedure Laterality Date  . Cesarean section    . Cholecystectomy    . Tubal ligation    . Finger surgery    . Bowel resection    . Colostomy takedown    . Biopsy thyroid  JAN 2015    Social History   Social History  . Marital Status: Married    Spouse Name: N/A  . Number of Children: N/A  . Years of Education: N/A   Occupational History  . Not on file.   Social History Main Topics  . Smoking status: Former Research scientist (life sciences)  . Smokeless tobacco: Never Used  . Alcohol Use: No  . Drug Use: Not on file  . Sexual Activity: No   Other Topics Concern  . Not on file   Social History Narrative    Current Outpatient Prescriptions on File Prior to Visit  Medication Sig Dispense Refill  . aspirin 81 MG tablet Take 81 mg by mouth daily.    Marland Kitchen buPROPion (WELLBUTRIN XL) 300 MG 24 hr tablet Take 300 mg by mouth daily.    . Calcium Carbonate-Vitamin D (CALCIUM + D PO) Take by mouth.    . Cholecalciferol (VITAMIN D PO) Take by mouth.    . levothyroxine (SYNTHROID, LEVOTHROID) 50 MCG tablet Take 1 tablet (50 mcg total) by mouth daily before breakfast. 90 tablet 3  . Omega-3 Fatty Acids (FISH OIL PO) Take by mouth.    .  rosuvastatin (CRESTOR) 20 MG tablet Take 20 mg by mouth daily.     No current facility-administered medications on file prior to visit.    No Known Allergies  Family History  Problem Relation Age of Onset  . Lung cancer Mother   . Cancer Mother     lung-smoker  . Heart disease Father   . Breast cancer Maternal Aunt     age 5  . Cancer Maternal Aunt     lymphoma  . Cancer Maternal Uncle     lung  . Cancer Maternal Grandmother     liver    BP 146/92 mmHg  Pulse 75  Temp(Src) 98.2 F (36.8 C) (Oral)  Ht 4\' 11"  (1.499 m)  Wt 224 lb (101.606 kg)  BMI 45.22 kg/m2  SpO2 97%  Review of Systems Denies edema    Objective:   Physical Exam VITAL SIGNS:  See vs page GENERAL: no distress NECK: There is no palpable thyroid enlargement.  No thyroid nodule is palpable.  No palpable lymphadenopathy at the anterior neck. Skin: not diaphoretic Neuro: no tremor  Lab Results  Component Value Date   TSH 4.23 06/24/2015  T4TOTAL 7.7 11/14/2012       Assessment & Plan:  Post-I-131 hypothyroidism, well-replaced Multinodular goiter, clinically nonpalpable.   Patient is advised the following: Patient Instructions  blood tests are requested for you today.  We'll let you know about the results.   Please come back for a follow-up appointment in 1 year, when you will be due to recheck the ultrasound.

## 2015-10-12 ENCOUNTER — Other Ambulatory Visit: Payer: Self-pay | Admitting: Gynecology

## 2015-10-12 DIAGNOSIS — M8580 Other specified disorders of bone density and structure, unspecified site: Secondary | ICD-10-CM

## 2015-10-12 DIAGNOSIS — R9389 Abnormal findings on diagnostic imaging of other specified body structures: Secondary | ICD-10-CM

## 2015-10-25 ENCOUNTER — Telehealth: Payer: Self-pay | Admitting: Gynecology

## 2015-10-25 NOTE — Telephone Encounter (Signed)
10/25/15-Pt was advised today that her [employer]-M ins covers the sonohysterogram under her $35 copay plus a flat $100 fee for the ultrasound guidance--total due of $135.00. Per Lou@[employer] . [id number]

## 2015-10-26 ENCOUNTER — Ambulatory Visit: Payer: Medicare Other | Admitting: Gynecology

## 2015-10-26 ENCOUNTER — Other Ambulatory Visit: Payer: Medicare Other

## 2015-11-02 ENCOUNTER — Ambulatory Visit: Payer: Medicare Other | Admitting: Gynecology

## 2015-11-02 ENCOUNTER — Other Ambulatory Visit: Payer: Medicare Other

## 2015-11-03 ENCOUNTER — Encounter: Payer: Self-pay | Admitting: Gynecology

## 2015-11-03 ENCOUNTER — Ambulatory Visit (INDEPENDENT_AMBULATORY_CARE_PROVIDER_SITE_OTHER): Payer: Medicare Other

## 2015-11-03 ENCOUNTER — Ambulatory Visit (INDEPENDENT_AMBULATORY_CARE_PROVIDER_SITE_OTHER): Payer: Medicare Other | Admitting: Gynecology

## 2015-11-03 DIAGNOSIS — N840 Polyp of corpus uteri: Secondary | ICD-10-CM

## 2015-11-03 DIAGNOSIS — R9389 Abnormal findings on diagnostic imaging of other specified body structures: Secondary | ICD-10-CM

## 2015-11-03 DIAGNOSIS — R938 Abnormal findings on diagnostic imaging of other specified body structures: Secondary | ICD-10-CM

## 2015-11-03 NOTE — Progress Notes (Signed)
   Patient is a 70 year old who presented to the office today for 6 month follow-up on a sonohysterogram due to previous findings of an endometrial polyp and had endometrial biopsy. She is on no hormone replacement therapy and reports no vaginal bleeding. Her history as follows:  Patient seen October 2016:  "Due to her vaginismus and limited pelvic exam and ultrasound was ordered on 01/14/2015 which demonstrated the following: Uterus measured 5.3 x 4.5 x 3.6 cm with endometrial stripe of 6.7 mm. Patient with 2 intramural fibroids largest 1 measuring 27 x 19 x 22 mm. A slight amount of slightly echogenic fluid was seen in the endometrium questionable small polyp 5 x 4 mm. Ovaries appear to be normal otherwise. No adnexal masses.  Ultrasound today demonstrated the following: Uterus measures 6.5 x 5.0 x 3.2 cm with endometrial stripe of 3.1 mm patient with several small intramural and subserosal fibroids the largest one measuring 15 x 20 mm. Fluid-filled endometrial cavity with a questionable small fundal defect right and left ovary were normal and atrophic. There was no fluid in the cul-de-sac. The cervix with an cleansed with Betadine solution following injection of normal saline and echogenic 5 x 5 mm defect was seen in the left uterine wall. An endometrial biopsy with a sterile Pipelle was obtained and segment of histological evaluation."  Her pathology report demonstrated the following: Diagnosis Endometrium, biopsy, uterus - FRAGMENTS OF BENIGN ENDOMETRIAL POLYP; NEGATIVE FOR ATYPIA SEE COMMENT - BENIGN ENDOMETRIAL GLANDS AND STROMA WITH ATROPHIC FEATURES. - BENIGN ENDOCERVICAL MUCOSA. - FRAGMENTS OF SQUAMOUS EPITHELIUM; NEGATIVE FOR INTRAEPITHELIAL LESION OR MALIGNANCY.  Patient's ultrasound/sono hysterogram today demonstrated the following: Uterus measured 5.3 x 5.4 x 3.0 cm with endometrial stripe of 2.0 mm. Intramural and subserous fibroids unchanged. Right and left ovary were normal. The  cervix was cleansed with Betadine solution and the cervix was dilated and a sterile catheter was introduced into the uterine cavity and no defect was noted.  Assessment/plan: Patient with past history of endometrial polyp no longer present and may have been removed completely at time of endometrial biopsy 6 months ago. Patient with no vaginal bleeding and no hormone replacement therapy. Stable small fibroids. Patient scheduled to return back to the office in July for her annual exam or when necessary.

## 2015-11-04 ENCOUNTER — Telehealth: Payer: Self-pay | Admitting: *Deleted

## 2015-11-04 NOTE — Telephone Encounter (Signed)
Pt left a U/A on 11/03/15 [facility] requesting results, I advised pt no order was placed so U/A was not order. Pt is not having any syptoms

## 2016-01-05 ENCOUNTER — Encounter: Payer: Self-pay | Admitting: Gynecology

## 2016-01-05 ENCOUNTER — Ambulatory Visit (INDEPENDENT_AMBULATORY_CARE_PROVIDER_SITE_OTHER): Payer: Medicare Other | Admitting: Gynecology

## 2016-01-05 VITALS — BP 132/80 | Ht 59.0 in | Wt 224.0 lb

## 2016-01-05 DIAGNOSIS — Z8639 Personal history of other endocrine, nutritional and metabolic disease: Secondary | ICD-10-CM

## 2016-01-05 DIAGNOSIS — Z01419 Encounter for gynecological examination (general) (routine) without abnormal findings: Secondary | ICD-10-CM | POA: Diagnosis not present

## 2016-01-05 DIAGNOSIS — M8580 Other specified disorders of bone density and structure, unspecified site: Secondary | ICD-10-CM

## 2016-01-05 NOTE — Progress Notes (Signed)
Tammy Hunt 1946-05-11 [id number]   History:    70 y.o.  for annual gyn exam who reports no vaginal bleeding. Patient was seen in the office in April of this year where she was being followed for a suspected endometrial polyp. Patient had an ultrasound/and sonohysterogram on April 20 and the following was noted:  Patient's ultrasound/sono hysterogram today demonstrated the following: Uterus measured 5.3 x 5.4 x 3.0 cm with endometrial stripe of 2.0 mm. Intramural and subserous fibroids unchanged. Right and left ovary were normal. The cervix was cleansed with Betadine solution and the cervix was dilated and a sterile catheter was introduced into the uterine cavity and no defect was noted. Patient has a history of hypothyroidism which is being followed and treated by her endocrinologist Dr. Loanne Drilling. Her primary care physician is Dr. Lin Landsman who is been doing her blood work. Review of her records indicated she was last seen in the office to discuss her bone density study on 12/24/2013. The following had been discussed:  Bone density study done 12/09/2013 at [facility] radiology it demonstrated the following:  Lowest T- score right femoral neck with a value -2.4 FRAX Analysis demonstrated that her ten-year fracture risk of the hip was 1% and major osteoporotic fracture 5.1% both subthreshold for treatment.  They did describe that there was statistically significant interval decrease in bone mineral density: Left femur -6.9% Right femur -8.5%  Patient stated that yesterday she had her bone density study and mammogram results pending. Patient does have past history vitamin D deficiency she is taking her calcium vitamin D regularly. Patient many years ago had bowel resection as a result of ruptured diverticulitis. Her colonoscopy was in January 2016 and benign polyps. She scheduled to return back in 3 years. Patient states all her vaccines are up-to-date. No past history of any abnormal Pap  smears.   Past medical history,surgical history, family history and social history were all reviewed and documented in the EPIC chart.  Gynecologic History No LMP recorded. Patient is postmenopausal. Contraception: post menopausal status Last Pap: 2016. Results were: normal Last mammogram: Yesterday. Results were: Results pending  Obstetric History OB History  Gravida Para Term Preterm AB SAB TAB Ectopic Multiple Living  2 1 1  1     1     # Outcome Date GA Lbr Len/2nd Weight Sex Delivery Anes PTL Lv  2 AB           1 Term                ROS: A ROS was performed and pertinent positives and negatives are included in the history.  GENERAL: No fevers or chills. HEENT: No change in vision, no earache, sore throat or sinus congestion. NECK: No pain or stiffness. CARDIOVASCULAR: No chest pain or pressure. No palpitations. PULMONARY: No shortness of breath, cough or wheeze. GASTROINTESTINAL: No abdominal pain, nausea, vomiting or diarrhea, melena or bright red blood per rectum. GENITOURINARY: No urinary frequency, urgency, hesitancy or dysuria. MUSCULOSKELETAL: No joint or muscle pain, no back pain, no recent trauma. DERMATOLOGIC: No rash, no itching, no lesions. ENDOCRINE: No polyuria, polydipsia, no heat or cold intolerance. No recent change in weight. HEMATOLOGICAL: No anemia or easy bruising or bleeding. NEUROLOGIC: No headache, seizures, numbness, tingling or weakness. PSYCHIATRIC: No depression, no loss of interest in normal activity or change in sleep pattern.     Exam: chaperone present  BP 132/80 mmHg  Ht 4\' 11"  (1.499 m)  Wt 224  lb (101.606 kg)  BMI 45.22 kg/m2  Body mass index is 45.22 kg/(m^2).  General appearance : Well developed well nourished female. No acute distress HEENT: Eyes: no retinal hemorrhage or exudates,  Neck supple, trachea midline, no carotid bruits, no thyroidmegaly Lungs: Clear to auscultation, no rhonchi or wheezes, or rib retractions  Heart: Regular  rate and rhythm, no murmurs or gallops Breast:Examined in sitting and supine position were symmetrical in appearance, no palpable masses or tenderness,  no skin retraction, no nipple inversion, no nipple discharge, no skin discoloration, no axillary or supraclavicular lymphadenopathy Abdomen: no palpable masses or tenderness, no rebound or guarding Extremities: no edema or skin discoloration or tenderness  Pelvic:  Bartholin, Urethra, Skene Glands: Within normal limits             Vagina: No gross lesions or discharge atrophic changes  Cervix: No gross lesions or discharge  Uterus  anteverted, normal size, shape and consistency, non-tender and mobile  Adnexa  Without masses or tenderness  Anus and perineum  normal   Rectovaginal  normal sphincter tone without palpated masses or tenderness             Hemoccult PCP provides     Assessment/Plan:  70 y.o. female for annual exam with borderline osteopenia versus osteoporosis on bone density study in 2015. Patient had bone density study yesterday results pending at time of this dictation. Phthisis we get the report we will analyze and probably said down in consultation if she is now osteoporotic or not. Because her vitamin D deficiency in the past we will check her vitamin D level. Her PCP is been doing the rest of her blood work and her cardiologist. Pap smear no longer indicated according to new guidelines. Patient not sexually active and on no hormone replacement therapy.   Terrance Mass MD, 4:37 PM 01/05/2016

## 2016-01-06 ENCOUNTER — Encounter: Payer: Self-pay | Admitting: Gynecology

## 2016-01-06 LAB — VITAMIN D 25 HYDROXY (VIT D DEFICIENCY, FRACTURES): Vit D, 25-Hydroxy: 44 ng/mL (ref 30–100)

## 2016-01-11 ENCOUNTER — Encounter: Payer: Self-pay | Admitting: Gynecology

## 2016-02-01 ENCOUNTER — Other Ambulatory Visit: Payer: Self-pay | Admitting: Endocrinology

## 2016-05-13 ENCOUNTER — Other Ambulatory Visit: Payer: Self-pay | Admitting: Endocrinology

## 2016-06-21 ENCOUNTER — Encounter: Payer: Self-pay | Admitting: Endocrinology

## 2016-06-21 ENCOUNTER — Ambulatory Visit (INDEPENDENT_AMBULATORY_CARE_PROVIDER_SITE_OTHER): Payer: Medicare Other | Admitting: Endocrinology

## 2016-06-21 VITALS — BP 122/80 | HR 64 | Ht 59.0 in | Wt 227.0 lb

## 2016-06-21 DIAGNOSIS — E039 Hypothyroidism, unspecified: Secondary | ICD-10-CM

## 2016-06-21 LAB — TSH: TSH: 3.01 u[IU]/mL (ref 0.35–4.50)

## 2016-06-21 NOTE — Progress Notes (Signed)
Subjective:    Patient ID: Tammy Hunt, female    DOB: [birth year], 70 y.o.   MRN: [id number]  HPI Pt returns for f/u of multinodular goiter and hypothyroidism (in early 2014, pt was noted on labs to have elevated TSH, but repeat was normal; in late 2014, repeat TSH was high, so synthroid was started; also in late 2014, US showed multinodular goiter; in early 2015, Bx showed non-neoplastic goiter (Beth cat 2); f/u US in early 2016 was unchanged).  She does not notice the nodule.  pt states she feels no different, and well in general.   Past Medical History:  Diagnosis Date  . Aortic insufficiency   . Depression   . Diverticulitis   . Elevated cholesterol   . High cholesterol   . Mitral valve regurgitation   . Osteopenia   . Papilloma of breast     Past Surgical History:  Procedure Laterality Date  . BIOPSY THYROID  JAN 2015  . BOWEL RESECTION    . CESAREAN SECTION    . CHOLECYSTECTOMY    . COLOSTOMY TAKEDOWN    . FINGER SURGERY    . TUBAL LIGATION      Social History   Social History  . Marital status: Married    Spouse name: N/A  . Number of children: N/A  . Years of education: N/A   Occupational History  . Not on file.   Social History Main Topics  . Smoking status: Former Research scientist (life sciences)  . Smokeless tobacco: Never Used  . Alcohol use No  . Drug use: Unknown  . Sexual activity: No   Other Topics Concern  . Not on file   Social History Narrative  . No narrative on file    Current Outpatient Prescriptions on File Prior to Visit  Medication Sig Dispense Refill  . aspirin 81 MG tablet Take 81 mg by mouth daily.    Marland Kitchen buPROPion (WELLBUTRIN XL) 300 MG 24 hr tablet Take 300 mg by mouth daily.    . Calcium Carb-Ergocalciferol 500-200 MG-UNIT TABS Take 1 tablet by mouth 2 (two) times daily.    . Cholecalciferol (VITAMIN D PO) Take by mouth.    . clonazePAM (KLONOPIN) 1 MG tablet Take 1 mg by mouth as needed.    Marland Kitchen levothyroxine (SYNTHROID, LEVOTHROID) 50 MCG tablet TAKE  1 TABLET BY MOUTH EVERY DAY BEFORE BREAKFAST 90 tablet 0  . Omega-3 Fatty Acids (FISH OIL PO) Take by mouth.    . rosuvastatin (CRESTOR) 20 MG tablet Take 20 mg by mouth daily.     No current facility-administered medications on file prior to visit.     No Known Allergies  Family History  Problem Relation Age of Onset  . Lung cancer Mother   . Cancer Mother     lung-smoker  . Heart disease Father   . Breast cancer Maternal Aunt     age 54  . Cancer Maternal Aunt     lymphoma  . Cancer Maternal Uncle     lung  . Cancer Maternal Grandmother     liver    BP 122/80   Pulse 64   Ht 4\' 11"  (1.499 m)   Wt 227 lb (103 kg)   SpO2 95%   BMI 45.85 kg/m    Review of Systems Denies edema    Objective:   Physical Exam VITAL SIGNS:  See vs page.  GENERAL: no distress.  NECK: There is no palpable thyroid enlargement.  No thyroid nodule is  palpable.  No palpable lymphadenopathy at the anterior neck.  Skin: not diaphoretic.   Neuro: no tremor.    Lab Results  Component Value Date   TSH 3.01 06/21/2016   T4TOTAL 7.7 11/14/2012      Assessment & Plan:  Hypothyroidism: well-replaced.  Please continue the same medication.

## 2016-06-21 NOTE — Patient Instructions (Addendum)
blood tests are requested for you today.  We'll let you know about the results.   Please return in 1 year, when you will be due for another ultrasound.       Hypothyroidism Hypothyroidism is a disorder of the thyroid. The thyroid is a large gland that is located in the lower front of the neck. The thyroid releases hormones that control how the body works. With hypothyroidism, the thyroid does not make enough of these hormones. What are the causes? Causes of hypothyroidism may include:  Viral infections.  Pregnancy.  Your own defense system (immune system) attacking your thyroid.  Certain medicines.  Birth defects.  Past radiation treatments to your head or neck.  Past treatment with radioactive iodine.  Past surgical removal of part or all of your thyroid.  Problems with the gland that is located in the center of your brain (pituitary). What are the signs or symptoms? Signs and symptoms of hypothyroidism may include:  Feeling as though you have no energy (lethargy).  Inability to tolerate cold.  Weight gain that is not explained by a change in diet or exercise habits.  Dry skin.  Coarse hair.  Menstrual irregularity.  Slowing of thought processes.  Constipation.  Sadness or depression. How is this diagnosed? Your health care provider may diagnose hypothyroidism with blood tests and ultrasound tests. How is this treated? Hypothyroidism is treated with medicine that replaces the hormones that your body does not make. After you begin treatment, it may take several weeks for symptoms to go away. Follow these instructions at home:  Take medicines only as directed by your health care provider.  If you start taking any new medicines, tell your health care provider.  Keep all follow-up visits as directed by your health care provider. This is important. As your condition improves, your dosage needs may change. You will need to have blood tests regularly so that your  health care provider can watch your condition. Contact a health care provider if:  Your symptoms do not get better with treatment.  You are taking thyroid replacement medicine and:  You sweat excessively.  You have tremors.  You feel anxious.  You lose weight rapidly.  You cannot tolerate heat.  You have emotional swings.  You have diarrhea.  You feel weak. Get help right away if:  You develop chest pain.  You develop an irregular heartbeat.  You develop a rapid heartbeat. This information is not intended to replace advice given to you by your health care provider. Make sure you discuss any questions you have with your health care provider. Document Released: 07/02/2005 Document Revised: 12/08/2015 Document Reviewed: 11/17/2013 Elsevier Interactive Patient Education  2017 [employer].

## 2016-08-21 ENCOUNTER — Other Ambulatory Visit: Payer: Self-pay | Admitting: Endocrinology

## 2016-11-14 ENCOUNTER — Other Ambulatory Visit: Payer: Self-pay | Admitting: Endocrinology

## 2016-11-28 ENCOUNTER — Encounter: Payer: Self-pay | Admitting: Gynecology

## 2017-01-11 ENCOUNTER — Encounter: Payer: Self-pay | Admitting: Gynecology

## 2017-02-28 ENCOUNTER — Other Ambulatory Visit: Payer: Self-pay

## 2017-02-28 MED ORDER — LEVOTHYROXINE SODIUM 50 MCG PO TABS: ORAL_TABLET | ORAL | 0 refills | Status: DC

## 2017-05-30 ENCOUNTER — Other Ambulatory Visit: Payer: Self-pay | Admitting: Endocrinology

## 2017-06-26 ENCOUNTER — Ambulatory Visit: Payer: Medicare Other | Admitting: Endocrinology

## 2017-06-28 ENCOUNTER — Encounter: Payer: Self-pay | Admitting: Endocrinology

## 2017-06-28 ENCOUNTER — Ambulatory Visit: Payer: Medicare Other | Admitting: Endocrinology

## 2017-06-28 VITALS — BP 141/91 | HR 73 | Wt 224.0 lb

## 2017-06-28 DIAGNOSIS — E042 Nontoxic multinodular goiter: Secondary | ICD-10-CM | POA: Diagnosis not present

## 2017-06-28 LAB — TSH: TSH: 4.95 u[IU]/mL — AB (ref 0.35–4.50)

## 2017-06-28 NOTE — Patient Instructions (Signed)
Let's recheck the ultrasound.  you will receive a phone call, about a day and time for an appointment. blood tests are requested for you today.  We'll let you know about the results.   Please come back for a follow-up appointment in 1 year.  

## 2017-06-28 NOTE — Progress Notes (Signed)
Subjective:    Patient ID: Tammy Hunt, female    DOB: [birth year], 71 y.o.   MRN: [id number]  HPI Pt returns for f/u of multinodular goiter and hypothyroidism (in early 2014, pt was noted on labs to have elevated TSH, but repeat was normal; in late 2014, repeat TSH was high, so synthroid was started; also in late 2014, US showed multinodular goiter; in early 2015, Bx showed non-neoplastic goiter (Beth cat 2); f/u US in early 2016 was unchanged).  She does not notice the nodule.  pt states she feels no different, and well in general.   Past Medical History:  Diagnosis Date  . Aortic insufficiency   . Depression   . Diverticulitis   . Elevated cholesterol   . High cholesterol   . Mitral valve regurgitation   . Osteopenia   . Papilloma of breast     Past Surgical History:  Procedure Laterality Date  . BIOPSY THYROID  JAN 2015  . BOWEL RESECTION    . CESAREAN SECTION    . CHOLECYSTECTOMY    . COLOSTOMY TAKEDOWN    . FINGER SURGERY    . TUBAL LIGATION      Social History   Socioeconomic History  . Marital status: Married    Spouse name: Not on file  . Number of children: Not on file  . Years of education: Not on file  . Highest education level: Not on file  Social Needs  . Financial resource strain: Not on file  . Food insecurity - worry: Not on file  . Food insecurity - inability: Not on file  . Transportation needs - medical: Not on file  . Transportation needs - non-medical: Not on file  Occupational History  . Not on file  Tobacco Use  . Smoking status: Former Research scientist (life sciences)  . Smokeless tobacco: Never Used  Substance and Sexual Activity  . Alcohol use: No  . Drug use: Not on file  . Sexual activity: No    Birth control/protection: Surgical  Other Topics Concern  . Not on file  Social History Narrative  . Not on file    Current Outpatient Medications on File Prior to Visit  Medication Sig Dispense Refill  . aspirin 81 MG tablet Take 81 mg by mouth daily.    Marland Kitchen  buPROPion (WELLBUTRIN XL) 300 MG 24 hr tablet Take 300 mg by mouth daily.    . Calcium Carb-Ergocalciferol 500-200 MG-UNIT TABS Take 1 tablet by mouth 2 (two) times daily.    . Cholecalciferol (VITAMIN D PO) Take by mouth.    . clonazePAM (KLONOPIN) 1 MG tablet Take 1 mg by mouth as needed.    . Omega-3 Fatty Acids (FISH OIL PO) Take by mouth.    . rosuvastatin (CRESTOR) 20 MG tablet Take 20 mg by mouth daily.     No current facility-administered medications on file prior to visit.     No Known Allergies  Family History  Problem Relation Age of Onset  . Lung cancer Mother   . Cancer Mother        lung-smoker  . Heart disease Father   . Breast cancer Maternal Aunt        age 45  . Cancer Maternal Aunt        lymphoma  . Cancer Maternal Uncle        lung  . Cancer Maternal Grandmother        liver    BP (!) 141/91 (BP Location: Right  Wrist, Patient Position: Sitting, Cuff Size: Normal)   Pulse 73   Wt 224 lb (101.6 kg)   SpO2 96%   BMI 45.24 kg/m    Review of Systems Denies neck pain.     Objective:   Physical Exam VITAL SIGNS:  See vs page GENERAL: no distress.  NECK: thyroid is normal except for slight fullness at the right side.       Assessment & Plan:  Multinodular goiter, due for recheck Hypothyroidism, due for recheck  Patient Instructions  Let's recheck the ultrasound.  you will receive a phone call, about a day and time for an appointment. blood tests are requested for you today.  We'll let you know about the results. Please come back for a follow-up appointment in 1 year.

## 2017-06-29 ENCOUNTER — Other Ambulatory Visit: Payer: Self-pay | Admitting: Endocrinology

## 2017-06-29 MED ORDER — LEVOTHYROXINE SODIUM 75 MCG PO TABS: 75.0000 ug | ORAL_TABLET | Freq: Every day | ORAL | 3 refills | Status: AC

## 2017-08-05 ENCOUNTER — Encounter: Payer: Self-pay | Admitting: Endocrinology

## 2017-09-18 ENCOUNTER — Ambulatory Visit: Payer: Medicare Other | Admitting: Women's Health

## 2017-09-18 ENCOUNTER — Encounter: Payer: Self-pay | Admitting: Women's Health

## 2017-09-18 VITALS — BP 122/80 | Ht 59.0 in | Wt 226.0 lb

## 2017-09-18 DIAGNOSIS — Z01419 Encounter for gynecological examination (general) (routine) without abnormal findings: Secondary | ICD-10-CM

## 2017-09-18 NOTE — Patient Instructions (Signed)
Carbohydrate Counting for Diabetes Mellitus, Adult Carbohydrate counting is a method for keeping track of how many carbohydrates you eat. Eating carbohydrates naturally increases the amount of sugar (glucose) in the blood. Counting how many carbohydrates you eat helps keep your blood glucose within normal limits, which helps you manage your diabetes (diabetes mellitus). It is important to know how many carbohydrates you can safely have in each meal. This is different for every person. A diet and nutrition specialist (registered dietitian) can help you make a meal plan and calculate how many carbohydrates you should have at each meal and snack. Carbohydrates are found in the following foods:  Grains, such as breads and cereals.  Dried beans and soy products.  Starchy vegetables, such as potatoes, peas, and corn.  Fruit and fruit juices.  Milk and yogurt.  Sweets and snack foods, such as cake, cookies, candy, chips, and soft drinks.  How do I count carbohydrates? There are two ways to count carbohydrates in food. You can use either of the methods or a combination of both. Reading "Nutrition Facts" on packaged food The "Nutrition Facts" list is included on the labels of almost all packaged foods and beverages in the [place] It includes:  The serving size.  Information about nutrients in each serving, including the grams (g) of carbohydrate per serving.  To use the "Nutrition Facts":  Decide how many servings you will have.  Multiply the number of servings by the number of carbohydrates per serving.  The resulting number is the total amount of carbohydrates that you will be having.  Learning standard serving sizes of other foods When you eat foods containing carbohydrates that are not packaged or do not include "Nutrition Facts" on the label, you need to measure the servings in order to count the amount of carbohydrates:  Measure the foods that you will eat with a food scale or  measuring cup, if needed.  Decide how many standard-size servings you will eat.  Multiply the number of servings by 15. Most carbohydrate-rich foods have about 15 g of carbohydrates per serving. ? For example, if you eat 8 oz (170 g) of strawberries, you will have eaten 2 servings and 30 g of carbohydrates (2 servings x 15 g = 30 g).  For foods that have more than one food mixed, such as soups and casseroles, you must count the carbohydrates in each food that is included.  The following list contains standard serving sizes of common carbohydrate-rich foods. Each of these servings has about 15 g of carbohydrates:   hamburger bun or  English muffin.   oz (15 mL) syrup.   oz (14 g) jelly.  1 slice of bread.  1 six-inch tortilla.  3 oz (85 g) cooked rice or pasta.  4 oz (113 g) cooked dried beans.  4 oz (113 g) starchy vegetable, such as peas, corn, or potatoes.  4 oz (113 g) hot cereal.  4 oz (113 g) mashed potatoes or  of a large baked potato.  4 oz (113 g) canned or frozen fruit.  4 oz (120 mL) fruit juice.  4-6 crackers.  6 chicken nuggets.  6 oz (170 g) unsweetened dry cereal.  6 oz (170 g) plain fat-free yogurt or yogurt sweetened with artificial sweeteners.  8 oz (240 mL) milk.  8 oz (170 g) fresh fruit or one small piece of fruit.  24 oz (680 g) popped popcorn.  Example of carbohydrate counting Sample meal  3 oz (85 g) chicken breast.    6 oz (170 g) brown rice.  4 oz (113 g) corn.  8 oz (240 mL) milk.  8 oz (170 g) strawberries with sugar-free whipped topping. Carbohydrate calculation 1. Identify the foods that contain carbohydrates: ? Rice. ? Corn. ? Milk. ? Strawberries. 2. Calculate how many servings you have of each food: ? 2 servings rice. ? 1 serving corn. ? 1 serving milk. ? 1 serving strawberries. 3. Multiply each number of servings by 15 g: ? 2 servings rice x 15 g = 30 g. ? 1 serving corn x 15 g = 15 g. ? 1 serving milk x 15  g = 15 g. ? 1 serving strawberries x 15 g = 15 g. 4. Add together all of the amounts to find the total grams of carbohydrates eaten: ? 30 g + 15 g + 15 g + 15 g = 75 g of carbohydrates total. This information is not intended to replace advice given to you by your health care provider. Make sure you discuss any questions you have with your health care provider. Document Released: 07/02/2005 Document Revised: 01/20/2016 Document Reviewed: 12/14/2015 Elsevier Interactive Patient Education  2018 [employer] Health Maintenance for Postmenopausal Women Menopause is a normal process in which your reproductive ability comes to an end. This process happens gradually over a span of months to years, usually between the ages of 48 and 55. Menopause is complete when you have missed 12 consecutive menstrual periods. It is important to talk with your health care provider about some of the most common conditions that affect postmenopausal women, such as heart disease, cancer, and bone loss (osteoporosis). Adopting a healthy lifestyle and getting preventive care can help to promote your health and wellness. Those actions can also lower your chances of developing some of these common conditions. What should I know about menopause? During menopause, you may experience a number of symptoms, such as:  Moderate-to-severe hot flashes.  Night sweats.  Decrease in sex drive.  Mood swings.  Headaches.  Tiredness.  Irritability.  Memory problems.  Insomnia.  Choosing to treat or not to treat menopausal changes is an individual decision that you make with your health care provider. What should I know about hormone replacement therapy and supplements? Hormone therapy products are effective for treating symptoms that are associated with menopause, such as hot flashes and night sweats. Hormone replacement carries certain risks, especially as you become older. If you are thinking about using estrogen or estrogen  with progestin treatments, discuss the benefits and risks with your health care provider. What should I know about heart disease and stroke? Heart disease, heart attack, and stroke become more likely as you age. This may be due, in part, to the hormonal changes that your body experiences during menopause. These can affect how your body processes dietary fats, triglycerides, and cholesterol. Heart attack and stroke are both medical emergencies. There are many things that you can do to help prevent heart disease and stroke:  Have your blood pressure checked at least every 1-2 years. High blood pressure causes heart disease and increases the risk of stroke.  If you are 55-79 years old, ask your health care provider if you should take aspirin to prevent a heart attack or a stroke.  Do not use any tobacco products, including cigarettes, chewing tobacco, or electronic cigarettes. If you need help quitting, ask your health care provider.  It is important to eat a healthy diet and maintain a healthy weight. ? Be sure to include   plenty of vegetables, fruits, low-fat dairy products, and lean protein. ? Avoid eating foods that are high in solid fats, added sugars, or salt (sodium).  Get regular exercise. This is one of the most important things that you can do for your health. ? Try to exercise for at least 150 minutes each week. The type of exercise that you do should increase your heart rate and make you sweat. This is known as moderate-intensity exercise. ? Try to do strengthening exercises at least twice each week. Do these in addition to the moderate-intensity exercise.  Know your numbers.Ask your health care provider to check your cholesterol and your blood glucose. Continue to have your blood tested as directed by your health care provider.  What should I know about cancer screening? There are several types of cancer. Take the following steps to reduce your risk and to catch any cancer development  as early as possible. Breast Cancer  Practice breast self-awareness. ? This means understanding how your breasts normally appear and feel. ? It also means doing regular breast self-exams. Let your health care provider know about any changes, no matter how small.  If you are 40 or older, have a clinician do a breast exam (clinical breast exam or CBE) every year. Depending on your age, family history, and medical history, it may be recommended that you also have a yearly breast X-ray (mammogram).  If you have a family history of breast cancer, talk with your health care provider about genetic screening.  If you are at high risk for breast cancer, talk with your health care provider about having an MRI and a mammogram every year.  Breast cancer (BRCA) gene test is recommended for women who have family members with BRCA-related cancers. Results of the assessment will determine the need for genetic counseling and BRCA1 and for BRCA2 testing. BRCA-related cancers include these types: ? Breast. This occurs in males or females. ? Ovarian. ? Tubal. This may also be called fallopian tube cancer. ? Cancer of the abdominal or pelvic lining (peritoneal cancer). ? Prostate. ? Pancreatic.  Cervical, Uterine, and Ovarian Cancer Your health care provider may recommend that you be screened regularly for cancer of the pelvic organs. These include your ovaries, uterus, and vagina. This screening involves a pelvic exam, which includes checking for microscopic changes to the surface of your cervix (Pap test).  For women ages 21-65, health care providers may recommend a pelvic exam and a Pap test every three years. For women ages 30-65, they may recommend the Pap test and pelvic exam, combined with testing for human papilloma virus (HPV), every five years. Some types of HPV increase your risk of cervical cancer. Testing for HPV may also be done on women of any age who have unclear Pap test results.  Other health  care providers may not recommend any screening for nonpregnant women who are considered low risk for pelvic cancer and have no symptoms. Ask your health care provider if a screening pelvic exam is right for you.  If you have had past treatment for cervical cancer or a condition that could lead to cancer, you need Pap tests and screening for cancer for at least 20 years after your treatment. If Pap tests have been discontinued for you, your risk factors (such as having a new sexual partner) need to be reassessed to determine if you should start having screenings again. Some women have medical problems that increase the chance of getting cervical cancer. In these cases, your   health care provider may recommend that you have screening and Pap tests more often.  If you have a family history of uterine cancer or ovarian cancer, talk with your health care provider about genetic screening.  If you have vaginal bleeding after reaching menopause, tell your health care provider.  There are currently no reliable tests available to screen for ovarian cancer.  Lung Cancer Lung cancer screening is recommended for adults 55-80 years old who are at high risk for lung cancer because of a history of smoking. A yearly low-dose CT scan of the lungs is recommended if you:  Currently smoke.  Have a history of at least 30 pack-years of smoking and you currently smoke or have quit within the past 15 years. A pack-year is smoking an average of one pack of cigarettes per day for one year.  Yearly screening should:  Continue until it has been 15 years since you quit.  Stop if you develop a health problem that would prevent you from having lung cancer treatment.  Colorectal Cancer  This type of cancer can be detected and can often be prevented.  Routine colorectal cancer screening usually begins at age 50 and continues through age 75.  If you have risk factors for colon cancer, your health care provider may  recommend that you be screened at an earlier age.  If you have a family history of colorectal cancer, talk with your health care provider about genetic screening.  Your health care provider may also recommend using home test kits to check for hidden blood in your stool.  A small camera at the end of a tube can be used to examine your colon directly (sigmoidoscopy or colonoscopy). This is done to check for the earliest forms of colorectal cancer.  Direct examination of the colon should be repeated every 5-10 years until age 75. However, if early forms of precancerous polyps or small growths are found or if you have a family history or genetic risk for colorectal cancer, you may need to be screened more often.  Skin Cancer  Check your skin from head to toe regularly.  Monitor any moles. Be sure to tell your health care provider: ? About any new moles or changes in moles, especially if there is a change in a mole's shape or color. ? If you have a mole that is larger than the size of a pencil eraser.  If any of your family members has a history of skin cancer, especially at a young age, talk with your health care provider about genetic screening.  Always use sunscreen. Apply sunscreen liberally and repeatedly throughout the day.  Whenever you are outside, protect yourself by wearing long sleeves, pants, a wide-brimmed hat, and sunglasses.  What should I know about osteoporosis? Osteoporosis is a condition in which bone destruction happens more quickly than new bone creation. After menopause, you may be at an increased risk for osteoporosis. To help prevent osteoporosis or the bone fractures that can happen because of osteoporosis, the following is recommended:  If you are 19-50 years old, get at least 1,000 mg of calcium and at least 600 mg of vitamin D per day.  If you are older than age 50 but younger than age 70, get at least 1,200 mg of calcium and at least 600 mg of vitamin D per  day.  If you are older than age 70, get at least 1,200 mg of calcium and at least 800 mg of vitamin D per day.    Smoking and excessive alcohol intake increase the risk of osteoporosis. Eat foods that are rich in calcium and vitamin D, and do weight-bearing exercises several times each week as directed by your health care provider. What should I know about how menopause affects my mental health? Depression may occur at any age, but it is more common as you become older. Common symptoms of depression include:  Low or sad mood.  Changes in sleep patterns.  Changes in appetite or eating patterns.  Feeling an overall lack of motivation or enjoyment of activities that you previously enjoyed.  Frequent crying spells.  Talk with your health care provider if you think that you are experiencing depression. What should I know about immunizations? It is important that you get and maintain your immunizations. These include:  Tetanus, diphtheria, and pertussis (Tdap) booster vaccine.  Influenza every year before the flu season begins.  Pneumonia vaccine.  Shingles vaccine.  Your health care provider may also recommend other immunizations. This information is not intended to replace advice given to you by your health care provider. Make sure you discuss any questions you have with your health care provider. Document Released: 08/24/2005 Document Revised: 01/20/2016 Document Reviewed: 04/05/2015 Elsevier Interactive Patient Education  2018 [employer].

## 2017-09-18 NOTE — Progress Notes (Signed)
MARLY SCHULD Nov 23, 1945 [id number]    History:    Presents for postmenopausal on no HRT with no bleeding. Normal Pap and mammogram history. 2017 T score -1.9 on both right and left femoral neck FRAX 9.1%/1.3%. Next colonoscopy due 2020 had benign polyps on last screening. Primary care manages hypothyroidism, hypercholesterolemia, depression and MVP. Current on vaccines.  Past medical history, past surgical history, family history and social history were all reviewed and documented in the EPIC chart.  ROS:  A ROS was performed and pertinent positives and negatives are included.  Exam:  Vitals:   09/18/17 1402  BP: 122/80  Weight: 226 lb (102.5 kg)  Height: 4\' 11"  (1.499 m)   Body mass index is 45.65 kg/m.   General appearance:  Normal Thyroid:  Symmetrical, normal in size, without palpable masses or nodularity. Respiratory  Auscultation:  Clear without wheezing or rhonchi Cardiovascular  Auscultation:  Regular rate, without rubs, murmurs or gallops  Edema/varicosities:  Not grossly evident Abdominal  Soft,nontender, without masses, guarding or rebound.  Liver/spleen:  No organomegaly noted  Hernia:  None appreciated  Skin  Inspection:  Grossly normal   Breasts: Examined lying and sitting.     Right: Without masses, retractions, discharge or axillary adenopathy.     Left: Without masses, retractions, discharge or axillary adenopathy. Gentitourinary   Inguinal/mons:  Normal without inguinal adenopathy  External genitalia:  Normal  BUS/Urethra/Skene's glands:  Normal  Vagina: Atrophic  Cervix:  Normal  Uterus: normal in size, shape and contour.  Midline and mobile  Adnexa/parametria:     Rt: Without masses or tenderness.   Lt: Without masses or tenderness.  Anus and perineum: Normal  Digital rectal exam: Normal sphincter tone without palpated masses or tenderness  Assessment/Plan:  72 y.o. M WF G2 P1 for breast and pelvic exam with no complaints.  Postmenopausal/no  HRT/no bleeding Hypothyroid/depression/hypercholesterolemia-primary care manages labs and meds  . Osteopenia without elevated FRAX Obesity  Plan: SBE's, continue annual screening mammogram, calcium rich foods, vitamin D 2000 daily encouraged. Reviewed importance of increasing regular exercise, decreasing calories/carbs for weight loss, weightbearing and balance type exercise encouraged. Home safety, fall prevention discussed. Repeat DEXA  [place], 5:19 PM 09/18/2017

## 2017-10-02 ENCOUNTER — Encounter: Payer: Self-pay | Admitting: Gastroenterology

## 2017-10-08 ENCOUNTER — Encounter: Payer: Self-pay | Admitting: Endocrinology

## 2017-11-11 ENCOUNTER — Encounter: Payer: Self-pay | Admitting: Gastroenterology

## 2017-12-16 ENCOUNTER — Encounter: Payer: Self-pay | Admitting: Gastroenterology

## 2017-12-17 ENCOUNTER — Encounter: Payer: Self-pay | Admitting: Gastroenterology

## 2017-12-17 ENCOUNTER — Ambulatory Visit (INDEPENDENT_AMBULATORY_CARE_PROVIDER_SITE_OTHER): Payer: Medicare Other | Admitting: Gastroenterology

## 2017-12-17 VITALS — BP 128/70 | HR 66 | Ht 59.0 in | Wt 220.0 lb

## 2017-12-17 DIAGNOSIS — K5732 Diverticulitis of large intestine without perforation or abscess without bleeding: Secondary | ICD-10-CM | POA: Diagnosis not present

## 2017-12-17 DIAGNOSIS — Z1211 Encounter for screening for malignant neoplasm of colon: Secondary | ICD-10-CM

## 2017-12-17 DIAGNOSIS — Z860100 Personal history of colon polyps, unspecified: Secondary | ICD-10-CM

## 2017-12-17 DIAGNOSIS — Z8719 Personal history of other diseases of the digestive system: Secondary | ICD-10-CM

## 2017-12-17 NOTE — Progress Notes (Signed)
Chief Complaint: Discuss colonoscopy  Referring Provider:  Angelina Sheriff, MD      ASSESSMENT AND PLAN;   #1.  H/O PSBO (June 2018) due to adhesions/internal henia - managed conservatively at [facility] (Dr Brantley Stage from Surgery).  #2. H/O colonic polyps - next colon 07/2019  #3.  History of diverticulitis status post Hartman's procedure 2008 followed by takedown of ostomy with LOA (Dr Jola Schmidt)  Plan: -Continue current medications and diet. -Patient wants to hold off on colonoscopy for now.  She is willing to get it done in 2021 (5 years from the previous colonoscopy).  I do agree.  She will be on recall for January 2021.  Certainly, if she starts having any new problems she will promptly get in touch with [name]. -If she starts having any increasing abdominal pain associated with nausea/vomiting -it may represent recurrent small bowel obstruction.  She will promptly seek medical care or get in touch with [name].  HPI:    KAULA KLENKE is a 72 y.o. female   Here to discuss regarding colonoscopy. Denies having any significant GI complaints. Was admitted to [facility] in June 2018 with partial small bowel obstruction, managed with NG tube, IV fluids.  She has done well since.  Does not want any abdominal surgery at the present time.   Past Medical History:  Diagnosis Date  . Aortic insufficiency   . Depression   . Diverticulitis   . Elevated cholesterol   . High cholesterol   . Mitral valve regurgitation   . Osteopenia   . Papilloma of breast     Past Surgical History:  Procedure Laterality Date  . BIOPSY THYROID  JAN 2015  . BOWEL RESECTION    . CESAREAN SECTION    . CHOLECYSTECTOMY    . COLONOSCOPY  07/26/2014   Colonic polyps statuas post polypectomy. Mild neo diverticulosis. External hemorrhoids.   . COLOSTOMY TAKEDOWN    . FINGER SURGERY    . TUBAL LIGATION      Family History  Problem Relation Age of Onset  . Lung cancer Mother   . Cancer Mother    lung-smoker  . Heart disease Father   . Cancer Maternal Grandmother        liver  . Breast cancer Maternal Aunt        age 11  . Cancer Maternal Aunt        lymphoma  . Cancer Maternal Uncle        lung    Social History   Tobacco Use  . Smoking status: Former Research scientist (life sciences)  . Smokeless tobacco: Never Used  Substance Use Topics  . Alcohol use: No  . Drug use: No    Current Outpatient Medications  Medication Sig Dispense Refill  . aspirin 81 MG tablet Take 81 mg by mouth daily.    Marland Kitchen buPROPion (WELLBUTRIN XL) 150 MG 24 hr tablet TAKE 1 (ONE) TABLET DAILY WITH MEALS  0  . Calcium Carb-Ergocalciferol 500-200 MG-UNIT TABS Take 1 tablet by mouth 2 (two) times daily.    . Cholecalciferol (VITAMIN D PO) Take by mouth.    . clonazePAM (KLONOPIN) 1 MG tablet Take 1 mg by mouth as needed.    Marland Kitchen levothyroxine (SYNTHROID, LEVOTHROID) 75 MCG tablet Take 1 tablet (75 mcg total) by mouth daily before breakfast. 90 tablet 3  . Omega-3 Fatty Acids (FISH OIL PO) Take by mouth.    . rosuvastatin (CRESTOR) 20 MG tablet Take 20 mg by  mouth daily.     No current facility-administered medications for this visit.     No Known Allergies  Review of Systems:  Constitutional: Denies fever, chills, diaphoresis, appetite change and fatigue.  HEENT: Denies photophobia, eye pain, redness, hearing loss, ear pain, congestion, sore throat, rhinorrhea, sneezing, mouth sores, neck pain, neck stiffness and tinnitus.   Respiratory: Denies SOB, DOE, cough, chest tightness,  and wheezing.   Cardiovascular: Denies chest pain, palpitations and leg swelling.  Genitourinary: Denies dysuria, urgency, frequency, hematuria, flank pain and difficulty urinating.  Musculoskeletal: Denies myalgias, back pain, joint swelling, arthralgias and gait problem.  Skin: No rash.  Neurological: Denies dizziness, seizures, syncope, weakness, light-headedness, numbness and headaches.  Hematological: Denies adenopathy. Easy bruising, personal  or family bleeding history  Psychiatric/Behavioral: No anxiety or depression     Physical Exam:   Vitals -blood pressure 128/70, weight 220 pounds, pulse 66/min with BMI 44 Constitutional:  Well-developed, in no acute distress. Psychiatric: Normal mood and affect. Behavior is normal. HEENT: Pupils normal.  Conjunctivae are normal. No scleral icterus. Neck supple.  Cardiovascular: Normal rate, regular rhythm. No edema Pulmonary/chest: Effort normal and breath sounds normal. No wheezing, rales or rhonchi. Abdominal: Soft, nondistended. Nontender. Bowel sounds active throughout. There are no masses palpable. No hepatomegaly.  Well-healed surgical scars.  Abdominal wall ventral hernia left lower quadrant at the site of previous colostomy. Rectal:  defered Neurological: Alert and oriented to person place and time. Skin: Skin is warm and dry. No rashes noted.  Data Reviewed: I have personally reviewed following labs and imaging studies  CBC: CBC Latest Ref Rng & Units 11/30/2009 11/28/2009  WBC 4.0 - 10.5 K/uL - 8.1  Hemoglobin 12.0 - 15.0 g/dL 15.6(H) 13.4  Hematocrit 36.0 - 46.0 % - 39.4  Platelets 150 - 400 K/uL - 175    CMP: CMP Latest Ref Rng & Units 07/22/2013 11/28/2009  Glucose 70 - 99 mg/dL - 102(H)  BUN 6 - 23 mg/dL - 11  Creatinine 0.4 - 1.2 mg/dL - 0.85  Sodium 135 - 145 mEq/L - 137  Potassium 3.5 - 5.1 mEq/L - 4.5  Chloride 96 - 112 mEq/L - 105  CO2 19 - 32 mEq/L - 27  Calcium 8.4 - 10.5 mg/dL 9.5 9.1     Carmell Austria, MD 12/17/2017, 5:13 PM  Cc: Angelina Sheriff, MD

## 2017-12-17 NOTE — Patient Instructions (Signed)
If you are age 71 or older, your body mass index should be between 23-30. Your Body mass index is 44.43 kg/m. If this is out of the aforementioned range listed, please consider follow up with your Primary Care Provider.  If you are age 57 or younger, your body mass index should be between 19-25. Your Body mass index is 44.43 kg/m. If this is out of the aformentioned range listed, please consider follow up with your Primary Care Provided.  You are on recall for a Repeat Colonoscopy in January or 2021.  Thank you,  Dr. Jackquline Denmark

## 2018-01-15 ENCOUNTER — Encounter: Payer: Self-pay | Admitting: Women's Health

## 2018-01-23 ENCOUNTER — Encounter: Payer: Self-pay | Admitting: Women's Health

## 2018-01-23 ENCOUNTER — Encounter (INDEPENDENT_AMBULATORY_CARE_PROVIDER_SITE_OTHER): Payer: Self-pay

## 2018-02-11 ENCOUNTER — Encounter: Payer: Self-pay | Admitting: Women's Health

## 2018-03-04 ENCOUNTER — Encounter: Payer: Self-pay | Admitting: Endocrinology

## 2018-03-12 ENCOUNTER — Other Ambulatory Visit: Payer: Self-pay | Admitting: Endocrinology

## 2018-03-12 DIAGNOSIS — E042 Nontoxic multinodular goiter: Secondary | ICD-10-CM

## 2018-03-28 ENCOUNTER — Encounter: Payer: Self-pay | Admitting: Cardiology

## 2018-03-28 ENCOUNTER — Ambulatory Visit (INDEPENDENT_AMBULATORY_CARE_PROVIDER_SITE_OTHER): Payer: Medicare Other | Admitting: Cardiology

## 2018-03-28 VITALS — BP 130/78 | HR 69 | Ht 59.0 in | Wt 221.4 lb

## 2018-03-28 DIAGNOSIS — I351 Nonrheumatic aortic (valve) insufficiency: Secondary | ICD-10-CM

## 2018-03-28 DIAGNOSIS — E785 Hyperlipidemia, unspecified: Secondary | ICD-10-CM | POA: Diagnosis not present

## 2018-03-28 DIAGNOSIS — I350 Nonrheumatic aortic (valve) stenosis: Secondary | ICD-10-CM | POA: Diagnosis not present

## 2018-03-28 DIAGNOSIS — E039 Hypothyroidism, unspecified: Secondary | ICD-10-CM

## 2018-03-28 MED ORDER — ROSUVASTATIN CALCIUM 20 MG PO TABS: 20.0000 mg | ORAL_TABLET | Freq: Every day | ORAL | 3 refills | Status: DC

## 2018-03-28 NOTE — Progress Notes (Signed)
Cardiology Office Note:    Date:  03/28/2018   ID:  Tammy Hunt, DOB 1946-01-09, MRN [id number]  PCP:  Angelina Sheriff, MD  Cardiologist:  Shirlee More, MD    Referring MD: Angelina Sheriff, MD    ASSESSMENT:    1. Nonrheumatic aortic valve stenosis   2. Nonrheumatic aortic valve insufficiency   3. Hyperlipidemia, unspecified hyperlipidemia type    PLAN:    In order of problems listed above:  1. Clinically stable I will access that echocardiogram from year ago and unless she had moderate stenosis will not repeat until I see her back in a year she will continue to follow endocarditis prophylaxis 2. See above same comment 3. Stable continue statin check liver function safety LDL efficacy today   Next appointment: One year   Medication Adjustments/Labs and Tests Ordered: Current medicines are reviewed at length with the patient today.  Concerns regarding medicines are outlined above.  No orders of the defined types were placed in this encounter.  No orders of the defined types were placed in this encounter.   Chief Complaint  Patient presents with  . Aortic Stenosis    History of Present Illness:    Tammy Hunt is a 72 y.o. female with a hx of hyperlipidemia, aortic stenosis mild and mild aortic insufficiency.  She was last seen 02/22/16 at [facility] cardiology. Compliance with diet, lifestyle and medications: Yes  She was seen last year at the [facility] cardiology practice and now is here to reestablish care.  She has aortic valve disease told me her echocardiogram a year ago was remarked upon his mild stenosis and unchanged have requested a copy of that report.  She follows endocarditis prophylaxis and has had no chest pain shortness of breath palpitation or syncope. Past Medical History:  Diagnosis Date  . Aortic insufficiency   . Depression   . Diverticulitis   . Elevated cholesterol   . High cholesterol   . Mitral valve regurgitation     . Osteopenia   . Papilloma of breast     Past Surgical History:  Procedure Laterality Date  . BIOPSY THYROID  JAN 2015  . BOWEL RESECTION    . CESAREAN SECTION    . CHOLECYSTECTOMY    . COLONOSCOPY  07/26/2014   Colonic polyps statuas post polypectomy. Mild neo diverticulosis. External hemorrhoids.   . COLOSTOMY TAKEDOWN    . FINGER SURGERY    . TUBAL LIGATION      Current Medications: Current Meds  Medication Sig  . aspirin 81 MG tablet Take 81 mg by mouth daily.  Marland Kitchen buPROPion (WELLBUTRIN XL) 150 MG 24 hr tablet TAKE 1 (ONE) TABLET DAILY WITH MEALS  . Calcium Carb-Ergocalciferol 500-200 MG-UNIT TABS Take 1 tablet by mouth 2 (two) times daily.  . Cholecalciferol (VITAMIN D PO) Take by mouth.  . clonazePAM (KLONOPIN) 1 MG tablet Take 0.5 mg by mouth as needed.   . DOCOSAHEXAENOIC ACID PO Take by mouth.  . levothyroxine (SYNTHROID, LEVOTHROID) 75 MCG tablet Take 1 tablet (75 mcg total) by mouth daily before breakfast.  . NON FORMULARY Take 1 tablet by mouth daily. Vein Supplement  . Omega-3 Fatty Acids (FISH OIL PO) Take by mouth.  . rosuvastatin (CRESTOR) 20 MG tablet Take 20 mg by mouth daily.     Allergies:   Patient has no known allergies.   Social History   Socioeconomic History  . Marital status: Married  Spouse name: Not on file  . Number of children: Not on file  . Years of education: Not on file  . Highest education level: Not on file  Occupational History  . Not on file  Social Needs  . Financial resource strain: Not on file  . Food insecurity:    Worry: Not on file    Inability: Not on file  . Transportation needs:    Medical: Not on file    Non-medical: Not on file  Tobacco Use  . Smoking status: Former Research scientist (life sciences)  . Smokeless tobacco: Never Used  Substance and Sexual Activity  . Alcohol use: No  . Drug use: No  . Sexual activity: Never    Birth control/protection: Surgical  Lifestyle  . Physical activity:    Days per week: Not on file     Minutes per session: Not on file  . Stress: Not on file  Relationships  . Social connections:    Talks on phone: Not on file    Gets together: Not on file    Attends religious service: Not on file    Active member of club or organization: Not on file    Attends meetings of clubs or organizations: Not on file    Relationship status: Not on file  Other Topics Concern  . Not on file  Social History Narrative  . Not on file     Family History: The patient's family history includes Breast cancer in her maternal aunt; Cancer in her maternal aunt, maternal grandmother, maternal uncle, and mother; Heart disease in her father; Lung cancer in her mother. ROS:   Please see the history of present illness.    All other systems reviewed and are negative.  EKGs/Labs/Other Studies Reviewed:    The following studies were reviewed today:  EKG:  EKG ordered today.  The ekg ordered today demonstrates sinus rhythm and normal  Recent Labs: 06/28/2017: TSH 4.95  Recent Lipid Panel No results found for: CHOL, TRIG, HDL, CHOLHDL, VLDL, LDLCALC, LDLDIRECT  Physical Exam:    VS:  BP 130/78 (BP Location: Right Arm, Patient Position: Sitting, Cuff Size: Large)   Pulse 69   Ht 4\' 11"  (1.499 m)   Wt 221 lb 6.4 oz (100.4 kg)   SpO2 98%   BMI 44.72 kg/m     Wt Readings from Last 3 Encounters:  03/28/18 221 lb 6.4 oz (100.4 kg)  12/17/17 220 lb (99.8 kg)  09/18/17 226 lb (102.5 kg)     GEN:  Well nourished, well developed in no acute distress HEENT: Normal NECK: No JVD; No carotid bruits LYMPHATICS: No lymphadenopathy CARDIAC: Grade 1/6 ejection murmur to the right carotid peaks mid S2 normal no AR carotid without bruit RRR,  RESPIRATORY:  Clear to auscultation without rales, wheezing or rhonchi  ABDOMEN: Soft, non-tender, non-distended MUSCULOSKELETAL:  No edema; No deformity  SKIN: Warm and dry NEUROLOGIC:  Alert and oriented x 3 PSYCHIATRIC:  Normal affect    Signed, Shirlee More, MD   03/28/2018 3:33 PM    [facility]

## 2018-03-28 NOTE — Patient Instructions (Signed)
Medication Instructions:  Your physician recommends that you continue on your current medications as directed. Please refer to the Current Medication list given to you today.   Labwork: Your physician recommends that you return for lab work today: lipid panel, CMP, TSH.   Testing/Procedures: You had an EKG today.   Follow-Up: Your physician wants you to follow-up in: 1 year. You will receive a reminder letter in the mail two months in advance. If you don't receive a letter, please call our office to schedule the follow-up appointment.   If you need a refill on your cardiac medications before your next appointment, please call your pharmacy.   Thank you for choosing [facility]! Robyne Peers, RN [phone]

## 2018-03-29 LAB — COMPREHENSIVE METABOLIC PANEL
ALK PHOS: 70 IU/L (ref 39–117)
ALT: 24 IU/L (ref 0–32)
AST: 18 IU/L (ref 0–40)
Albumin/Globulin Ratio: 2.1 (ref 1.2–2.2)
Albumin: 4.7 g/dL (ref 3.5–4.8)
BILIRUBIN TOTAL: 0.3 mg/dL (ref 0.0–1.2)
BUN/Creatinine Ratio: 13 (ref 12–28)
BUN: 14 mg/dL (ref 8–27)
CHLORIDE: 105 mmol/L (ref 96–106)
CO2: 24 mmol/L (ref 20–29)
CREATININE: 1.05 mg/dL — AB (ref 0.57–1.00)
Calcium: 10.2 mg/dL (ref 8.7–10.3)
GFR calc Af Amer: 62 mL/min/{1.73_m2} (ref >59)
GFR calc non Af Amer: 54 mL/min/{1.73_m2} — ABNORMAL LOW (ref >59)
GLUCOSE: 92 mg/dL (ref 65–99)
Globulin, Total: 2.2 g/dL (ref 1.5–4.5)
Potassium: 4.9 mmol/L (ref 3.5–5.2)
Sodium: 144 mmol/L (ref 134–144)
Total Protein: 6.9 g/dL (ref 6.0–8.5)

## 2018-03-29 LAB — LIPID PANEL
CHOL/HDL RATIO: 3.2 ratio (ref 0.0–4.4)
Cholesterol, Total: 191 mg/dL (ref 100–199)
HDL: 60 mg/dL (ref >39)
LDL CALC: 98 mg/dL (ref 0–99)
TRIGLYCERIDES: 164 mg/dL — AB (ref 0–149)
VLDL Cholesterol Cal: 33 mg/dL (ref 5–40)

## 2018-03-29 LAB — TSH: TSH: 3.3 u[IU]/mL (ref 0.450–4.500)

## 2018-04-04 ENCOUNTER — Telehealth: Payer: Self-pay | Admitting: Cardiology

## 2018-04-04 NOTE — Telephone Encounter (Signed)
Patient was concerned that at the top of her EKG it states "cannot rule out anterior infarct." Expressed to patient that these interpretations are not always accurate and that Dr. Bettina Gavia signed off on her EKG stating that everything looked good. Patient verbalized understanding. No further questions.

## 2018-04-04 NOTE — Telephone Encounter (Signed)
Pt states she saw her EKG on my chart and that something was written on it that is disturbing to her and wants someone to call her about it before 1 o'clock

## 2018-05-09 ENCOUNTER — Telehealth: Payer: Self-pay | Admitting: *Deleted

## 2018-05-09 ENCOUNTER — Other Ambulatory Visit: Payer: Self-pay | Admitting: Cardiology

## 2018-05-09 DIAGNOSIS — I350 Nonrheumatic aortic (valve) stenosis: Secondary | ICD-10-CM

## 2018-05-09 NOTE — Telephone Encounter (Signed)
Patient informed that records including echocardiogram from 03/2017 were received from [facility]. Dr. Bettina Gavia reviewed the echo and advised to do a repeat echo to assess aortic stenosis. Patient agreeable and has been scheduled for Tuesday, 05/13/18 at 10:15 am in the [place] office. Patient verbalized understanding. No further questions.

## 2018-05-13 ENCOUNTER — Ambulatory Visit (INDEPENDENT_AMBULATORY_CARE_PROVIDER_SITE_OTHER): Payer: Medicare Other

## 2018-05-13 DIAGNOSIS — I350 Nonrheumatic aortic (valve) stenosis: Secondary | ICD-10-CM

## 2018-05-13 NOTE — Progress Notes (Signed)
Complete echocardiogram has been performed.  Jimmy Armanda Forand RDCS, RVT 

## 2018-05-14 ENCOUNTER — Telehealth: Payer: Self-pay

## 2018-05-14 NOTE — Telephone Encounter (Signed)
Attempted to reach patient regarding echocardiogram results per Dr Bettina Gavia, but there was no answer and no voicemail.  Will continue efforts.

## 2018-05-14 NOTE — Telephone Encounter (Signed)
-----   Message from Richardo Priest, MD sent at 05/14/2018  8:01 AM EDT ----- Moderate AS  Recheck echo 6 months

## 2018-05-15 NOTE — Telephone Encounter (Signed)
Patient notified of echocardiogram results with moderate aortic stenosis.  Will recheck echocardiogram in 6 months.  Patient verbalized understanding.

## 2018-05-22 NOTE — Progress Notes (Signed)
Cardiology Office Note:    Date:  05/23/2018   ID:  Tammy Hunt, DOB 1946/01/12, MRN [id number]  PCP:  Tammy Sheriff, MD  Cardiologist:  Tammy More, MD    Referring MD: Tammy Sheriff, MD    ASSESSMENT:    1. Nonrheumatic aortic valve stenosis   2. Hyperlipidemia, unspecified hyperlipidemia type    PLAN:    In order of problems listed above:  1. Historically she has a bicuspid aortic valve murmur greater than 20 years aortic stenosis has progressed its moderate she is asymptomatic we will follow closely in the office with a combination of symptoms echocardiogram and when she becomes severe and symptomatic refer for valvular intervention. 2. Stable continue her statin   Next appointment: 6 months after next echocardiogram   Medication Adjustments/Labs and Tests Ordered: Current medicines are reviewed at length with the patient today.  Concerns regarding medicines are outlined above.  Orders Placed This Encounter  Procedures  . ECHOCARDIOGRAM COMPLETE   No orders of the defined types were placed in this encounter.   No chief complaint on file.   History of Present Illness:    Tammy Hunt is a 72 y.o. female with a hx of hyperlipidemia aortic stenosis and aortic insufficiency last seen 03/28/2018.  Recent echocardiogram shows the presence of moderate left ventricular hypertrophy normal ejection fraction moderate aortic stenosis VTI ratio 0.39 peak velocity ratio 0.41 and peak and mean gradients of 65 and 39 mmHg aortic valve area of 1.1 to 1.3 cm aortic regurgitation. Compliance with diet, lifestyle and medications: yes  He is concerned regarding the natural history of aortic stenosis.  She understands that she has progressed from mild to moderate and the rate of progression accelerates I told her in the next 1 to 5 years she will need valvular intervention we will plan to recheck echocardiogram in 6 months follow-up with me afterwards at this time she is  asymptomatic no syncope chest pain shortness of breath.  She follows endocarditis prophylaxis  Past Medical History:  Diagnosis Date  . Aortic insufficiency   . Depression   . Diverticulitis   . Elevated cholesterol   . High cholesterol   . Mitral valve regurgitation   . Osteopenia   . Papilloma of breast     Past Surgical History:  Procedure Laterality Date  . BIOPSY THYROID  JAN 2015  . BOWEL RESECTION    . CESAREAN SECTION    . CHOLECYSTECTOMY    . COLONOSCOPY  07/26/2014   Colonic polyps statuas post polypectomy. Mild neo diverticulosis. External hemorrhoids.   . COLOSTOMY TAKEDOWN    . FINGER SURGERY    . TUBAL LIGATION      Current Medications: Current Meds  Medication Sig  . aspirin 81 MG tablet Take 81 mg by mouth daily.  Marland Kitchen buPROPion (WELLBUTRIN XL) 300 MG 24 hr tablet Take 300 mg by mouth daily.   . Calcium Carb-Ergocalciferol 500-200 MG-UNIT TABS Take 1 tablet by mouth 2 (two) times daily.  . Cholecalciferol (VITAMIN D PO) Take 1 tablet by mouth 2 (two) times daily.   . clonazePAM (KLONOPIN) 1 MG tablet Take 0.5 mg by mouth as needed.   Marland Kitchen levothyroxine (SYNTHROID, LEVOTHROID) 75 MCG tablet Take 1 tablet (75 mcg total) by mouth daily before breakfast.  . Magnesium 500 MG TABS Take 1 tablet by mouth daily.  . NON FORMULARY Take 1 tablet by mouth daily. Vein Supplement  . Omega-3 Fatty Acids (FISH OIL  PO) Take 2 tablets by mouth 2 (two) times daily.   . rosuvastatin (CRESTOR) 20 MG tablet Take 1 tablet (20 mg total) by mouth daily.     Allergies:   Patient has no known allergies.   Social History   Socioeconomic History  . Marital status: Married    Spouse name: Not on file  . Number of children: Not on file  . Years of education: Not on file  . Highest education level: Not on file  Occupational History  . Not on file  Social Needs  . Financial resource strain: Not on file  . Food insecurity:    Worry: Not on file    Inability: Not on file  .  Transportation needs:    Medical: Not on file    Non-medical: Not on file  Tobacco Use  . Smoking status: Former Research scientist (life sciences)  . Smokeless tobacco: Never Used  Substance and Sexual Activity  . Alcohol use: No  . Drug use: No  . Sexual activity: Never    Birth control/protection: Surgical  Lifestyle  . Physical activity:    Days per week: Not on file    Minutes per session: Not on file  . Stress: Not on file  Relationships  . Social connections:    Talks on phone: Not on file    Gets together: Not on file    Attends religious service: Not on file    Active member of club or organization: Not on file    Attends meetings of clubs or organizations: Not on file    Relationship status: Not on file  Other Topics Concern  . Not on file  Social History Narrative  . Not on file     Family History: The patient's family history includes Breast cancer in her maternal aunt; Cancer in her maternal aunt, maternal grandmother, maternal uncle, and mother; Heart disease in her father; Lung cancer in her mother. ROS:   Please see the history of present illness.    All other systems reviewed and are negative.  EKGs/Labs/Other Studies Reviewed:    The following studies were reviewed today:  Recent Labs: 03/28/2018: ALT 24; BUN 14; Creatinine, Ser 1.05; Potassium 4.9; Sodium 144; TSH 3.300  Recent Lipid Panel    Component Value Date/Time   CHOL 191 03/28/2018 1542   TRIG 164 (H) 03/28/2018 1542   HDL 60 03/28/2018 1542   CHOLHDL 3.2 03/28/2018 1542   LDLCALC 98 03/28/2018 1542    Physical Exam:    VS:  BP 134/80 (BP Location: Left Arm, Patient Position: Sitting, Cuff Size: Large)   Pulse 98   Ht 4\' 11"  (1.499 m)   SpO2 97%   BMI 44.72 kg/m     Wt Readings from Last 3 Encounters:  03/28/18 221 lb 6.4 oz (100.4 kg)  12/17/17 220 lb (99.8 kg)  09/18/17 226 lb (102.5 kg)     GEN:  Well nourished, well developed in no acute distress HEENT: Normal NECK: No JVD; No carotid  bruits LYMPHATICS: No lymphadenopathy CARDIAC: 3/6 RRR, harsh mid-to-late peaking S2 remains split aortic stenosis  RESPIRATORY:  Clear to auscultation without rales, wheezing or rhonchi  ABDOMEN: Soft, non-tender, non-distended MUSCULOSKELETAL:  No edema; No deformity  SKIN: Warm and dry NEUROLOGIC:  Alert and oriented x 3 PSYCHIATRIC:  Normal affect    Signed, Tammy More, MD  05/23/2018 12:55 PM    [place]

## 2018-05-23 ENCOUNTER — Ambulatory Visit (INDEPENDENT_AMBULATORY_CARE_PROVIDER_SITE_OTHER): Payer: Medicare Other | Admitting: Cardiology

## 2018-05-23 VITALS — BP 134/80 | HR 98 | Ht 59.0 in

## 2018-05-23 DIAGNOSIS — I350 Nonrheumatic aortic (valve) stenosis: Secondary | ICD-10-CM

## 2018-05-23 DIAGNOSIS — E785 Hyperlipidemia, unspecified: Secondary | ICD-10-CM | POA: Diagnosis not present

## 2018-05-23 NOTE — Patient Instructions (Signed)
Medication Instructions:  Your physician recommends that you continue on your current medications as directed. Please refer to the Current Medication list given to you today.  If you need a refill on your cardiac medications before your next appointment, please call your pharmacy.   Lab work: None  If you have labs (blood work) drawn today and your tests are completely normal, you will receive your results only by: Marland Kitchen MyChart Message (if you have MyChart) OR . A paper copy in the mail If you have any lab test that is abnormal or we need to change your treatment, we will call you to review the results.  Testing/Procedures: Your physician has requested that you have an echocardiogram. Echocardiography is a painless test that uses sound waves to create images of your heart. It provides your doctor with information about the size and shape of your heart and how well your heart's chambers and valves are working. This procedure takes approximately one hour. There are no restrictions for this procedure. This will be scheduled in April 2020.   Follow-Up: At [facility], you and your health needs are our priority.  As part of our continuing mission to provide you with exceptional heart care, we have created designated Provider Care Teams.  These Care Teams include your primary Cardiologist (physician) and Advanced Practice Providers (APPs -  Physician Assistants and Nurse Practitioners) who all work together to provide you with the care you need, when you need it. You will need a follow up appointment in 6 months.  Please call our office 2 months in advance to schedule this appointment.     Echocardiogram An echocardiogram, or echocardiography, uses sound waves (ultrasound) to produce an image of your heart. The echocardiogram is simple, painless, obtained within a short period of time, and offers valuable information to your health care provider. The images from an echocardiogram can provide  information such as:  Evidence of coronary artery disease (CAD).  Heart size.  Heart muscle function.  Heart valve function.  Aneurysm detection.  Evidence of a past heart attack.  Fluid buildup around the heart.  Heart muscle thickening.  Assess heart valve function.  Tell a health care provider about:  Any allergies you have.  All medicines you are taking, including vitamins, herbs, eye drops, creams, and over-the-counter medicines.  Any problems you or family members have had with anesthetic medicines.  Any blood disorders you have.  Any surgeries you have had.  Any medical conditions you have.  Whether you are pregnant or may be pregnant. What happens before the procedure? No special preparation is needed. Eat and drink normally. What happens during the procedure?  In order to produce an image of your heart, gel will be applied to your chest and a wand-like tool (transducer) will be moved over your chest. The gel will help transmit the sound waves from the transducer. The sound waves will harmlessly bounce off your heart to allow the heart images to be captured in real-time motion. These images will then be recorded.  You may need an IV to receive a medicine that improves the quality of the pictures. What happens after the procedure? You may return to your normal schedule including diet, activities, and medicines, unless your health care provider tells you otherwise. This information is not intended to replace advice given to you by your health care provider. Make sure you discuss any questions you have with your health care provider. Document Released: 06/29/2000 Document Revised: 02/18/2016 Document Reviewed: 03/09/2013  Chartered certified accountant Patient Education  [employer].

## 2018-05-29 ENCOUNTER — Telehealth: Payer: Self-pay | Admitting: Endocrinology

## 2018-05-29 NOTE — Telephone Encounter (Signed)
Please call pt

## 2018-05-29 NOTE — Telephone Encounter (Signed)
please call patient: [name]: no change--good

## 2018-05-29 NOTE — Telephone Encounter (Signed)
Called Tammy Hunt to make her aware. Tammy Hunt thanked me for the results but states she is no longer under Dr. Cordelia Pen care. Now under care of Dr. Lin Landsman.

## 2018-06-26 ENCOUNTER — Ambulatory Visit: Payer: Medicare Other | Admitting: Endocrinology

## 2018-07-31 ENCOUNTER — Other Ambulatory Visit: Payer: Self-pay | Admitting: Endocrinology

## 2018-08-13 ENCOUNTER — Telehealth: Payer: Self-pay | Admitting: Cardiology

## 2018-08-13 NOTE — Telephone Encounter (Signed)
Patient is taking currently taking rosuvastatin 20 mg daily. Will have Dr. Agustin Cree advise since Dr. Bettina Gavia is out of the office this week.

## 2018-08-13 NOTE — Telephone Encounter (Signed)
Attempted to contact patient on home phone with no answer, unable to leave message. Left message on patient's cell phone per DPR to return call.

## 2018-08-13 NOTE — Telephone Encounter (Signed)
Options: stop crestor for a week and see if any improvement \ Or change crestor to Lipitor 20 mg po qd

## 2018-08-13 NOTE — Telephone Encounter (Signed)
Patient takes cholesterol meds and she is stating that she is having a lot of pain in legs and she thinks it is coming from the meds.  Can she possibly take another medicine?

## 2018-08-14 ENCOUNTER — Telehealth: Payer: Self-pay | Admitting: Cardiology

## 2018-08-14 NOTE — Telephone Encounter (Signed)
Patient informed of options and she wishes to hold rosuvastatin for 1 week and see if symptoms improve or resolve before switching to another cholesterol medication. Advised patient to contact our office in 1 week with an update as to how she is feeling. Patient is agreeable and verbalized understanding. No further questions.

## 2018-08-14 NOTE — Telephone Encounter (Signed)
Patient would like a call back please.

## 2018-08-14 NOTE — Telephone Encounter (Signed)
Duplicate encounter. Please see previous telephone note.

## 2018-08-28 ENCOUNTER — Telehealth: Payer: Self-pay | Admitting: Cardiology

## 2018-08-28 NOTE — Telephone Encounter (Signed)
Pravastatin 20 mg daily # 30 or 90 refill 6 or 1

## 2018-08-28 NOTE — Telephone Encounter (Signed)
Patient vcalled and she spoke with you about 2 weeks ago ans Dr Bettina Gavia agreed that she could come off Rouvastatin and she has been off the meds for 2 week s and she is NOT having pain anymore!!! So advis another stsatin that may help patient .. please call call to the [facility], ALSO she is adding in a supplement co Q10.

## 2018-08-28 NOTE — Telephone Encounter (Signed)
Please advise. Thanks.  

## 2018-08-29 MED ORDER — PRAVASTATIN SODIUM 20 MG PO TABS: 20.0000 mg | ORAL_TABLET | Freq: Every evening | ORAL | 3 refills | Status: DC

## 2018-08-29 NOTE — Telephone Encounter (Signed)
Patient informed that Dr. Bettina Gavia recommends she start pravastatin 20 mg daily. Patient is agreeable and [facility] [place]. No further questions.

## 2018-08-29 NOTE — Addendum Note (Signed)
Addended by: Austin Miles on: 08/29/2018 09:20 AM   Modules accepted: Orders

## 2018-09-23 ENCOUNTER — Encounter: Payer: Self-pay | Admitting: Podiatry

## 2018-09-23 ENCOUNTER — Ambulatory Visit (INDEPENDENT_AMBULATORY_CARE_PROVIDER_SITE_OTHER): Payer: Medicare Other

## 2018-09-23 ENCOUNTER — Encounter: Payer: Medicare Other | Admitting: Women's Health

## 2018-09-23 ENCOUNTER — Other Ambulatory Visit: Payer: Self-pay | Admitting: Podiatry

## 2018-09-23 ENCOUNTER — Ambulatory Visit: Payer: Medicare Other | Admitting: Podiatry

## 2018-09-23 VITALS — BP 137/77 | HR 80 | Resp 16 | Ht 59.0 in | Wt 223.0 lb

## 2018-09-23 DIAGNOSIS — M779 Enthesopathy, unspecified: Secondary | ICD-10-CM

## 2018-09-23 DIAGNOSIS — M76821 Posterior tibial tendinitis, right leg: Secondary | ICD-10-CM | POA: Diagnosis not present

## 2018-09-23 DIAGNOSIS — M216X1 Other acquired deformities of right foot: Secondary | ICD-10-CM

## 2018-09-23 DIAGNOSIS — M216X2 Other acquired deformities of left foot: Secondary | ICD-10-CM

## 2018-09-23 DIAGNOSIS — M25571 Pain in right ankle and joints of right foot: Secondary | ICD-10-CM

## 2018-09-23 NOTE — Progress Notes (Signed)
Subjective:  Patient ID: Tammy Hunt, female    DOB: 03/03/1946,  MRN: [id number]  Chief Complaint  Patient presents with  . Foot Pain    R ankle medial side x worse for 2 wks but been going on for 1 yr; 8/10 stabbing/tingling/heat -worse in AM -pt states her ankle feels tight and it hurts turning foot inward Tx: rest, ibuprofen, wrapping   73 y.o. female presents with the above complaint. History above confirmed with patient.  Review of Systems: Negative except as noted in the HPI. Denies N/V/F/Ch.  Past Medical History:  Diagnosis Date  . Aortic insufficiency   . Depression   . Diverticulitis   . Elevated cholesterol   . High cholesterol   . Mitral valve regurgitation   . Osteopenia   . Papilloma of breast     Current Outpatient Medications:  .  aspirin 81 MG tablet, Take 81 mg by mouth daily., Disp: , Rfl:  .  buPROPion (WELLBUTRIN XL) 300 MG 24 hr tablet, Take 300 mg by mouth daily. , Disp: , Rfl: 0 .  Calcium Carb-Ergocalciferol 500-200 MG-UNIT TABS, Take 1 tablet by mouth 2 (two) times daily., Disp: , Rfl:  .  Cholecalciferol (VITAMIN D PO), Take 1 tablet by mouth 2 (two) times daily. , Disp: , Rfl:  .  clonazePAM (KLONOPIN) 1 MG tablet, Take 0.5 mg by mouth as needed. , Disp: , Rfl:  .  levothyroxine (SYNTHROID, LEVOTHROID) 75 MCG tablet, Take 1 tablet (75 mcg total) by mouth daily before breakfast., Disp: 90 tablet, Rfl: 3 .  Magnesium 500 MG TABS, Take 1 tablet by mouth daily., Disp: , Rfl:  .  NON FORMULARY, Take 1 tablet by mouth daily. Vein Supplement, Disp: , Rfl:  .  Omega-3 Fatty Acids (FISH OIL PO), Take 2 tablets by mouth 2 (two) times daily. , Disp: , Rfl:  .  pravastatin (PRAVACHOL) 20 MG tablet, Take 1 tablet (20 mg total) by mouth every evening., Disp: 30 tablet, Rfl: 3  Social History   Tobacco Use  Smoking Status Former Smoker  Smokeless Tobacco Never Used    No Known Allergies Objective:   Vitals:   09/23/18 1020  BP: 137/77  Pulse: 80    Resp: 16   Body mass index is 45.04 kg/m. Constitutional Well developed. Well nourished.  Vascular Dorsalis pedis pulses palpable bilaterally. Posterior tibial pulses palpable bilaterally. Capillary refill normal to all digits.  No cyanosis or clubbing noted. Pedal hair growth normal.  Neurologic Normal speech. Oriented to person, place, and time. Epicritic sensation to light touch grossly present bilaterally.  Dermatologic Nails well groomed and normal in appearance. No open wounds. No skin lesions.  Orthopedic: POP R PT Tendon along the course and about the malleolus.  Equinus BLE Pes planus bilat   Radiographs: Taken and reviewed. Severe pes planus with increased talar declination, subtalar degeneration. Assessment:   1. Posterior tibial tendon dysfunction (PTTD) of right lower extremity   2. Tendonitis   3. Pain in right ankle and joints of right foot   4. Arthralgia of hindfoot, right   5. Acquired equinus deformity of both feet    Plan:  Patient was evaluated and treated and all questions answered.  PT Tendonitis R -Educated on etiology. -Injection R PT Tendon -Protect in Trilock brace -Discussed immobilization for 4 weeks.  Procedure: Injection Tendon/Ligament Consent: Verbal consent obtained. Location: Right PT Tendon Skin Prep: Alcohol. Injectate: 1 cc 0.5% marcaine plain, 1 cc dexamethasone phosphate, 0.5 cc  kenalog 10. Disposition: Patient tolerated procedure well. Injection site dressed with a band-aid.  Equinus BLE -Discussed etiology of equinus as contributing to deformity -Dispense Night splint.  Return in about 6 weeks (around 11/04/2018) for PT Tendonitis, Equinus f/u .

## 2018-09-23 NOTE — Progress Notes (Signed)
   Subjective:    Patient ID: Tammy Hunt, female    DOB: Jun 27, 1946, 73 y.o.   MRN: [id number]  HPI    Review of Systems  Musculoskeletal: Positive for arthralgias, gait problem, joint swelling and myalgias.  All other systems reviewed and are negative.      Objective:   Physical Exam        Assessment & Plan:

## 2018-10-14 ENCOUNTER — Telehealth: Payer: Self-pay | Admitting: Cardiology

## 2018-10-14 NOTE — Telephone Encounter (Signed)
Echo/BJM

## 2018-10-23 ENCOUNTER — Other Ambulatory Visit: Payer: Medicare Other

## 2018-11-04 ENCOUNTER — Ambulatory Visit: Payer: Medicare Other | Admitting: Podiatry

## 2018-11-22 ENCOUNTER — Other Ambulatory Visit: Payer: Self-pay | Admitting: Cardiology

## 2018-11-24 NOTE — Telephone Encounter (Signed)
Rx refill sent to pharmacy. 

## 2019-01-13 ENCOUNTER — Other Ambulatory Visit: Payer: Medicare Other

## 2019-01-15 ENCOUNTER — Ambulatory Visit (INDEPENDENT_AMBULATORY_CARE_PROVIDER_SITE_OTHER): Payer: Medicare Other

## 2019-01-15 ENCOUNTER — Other Ambulatory Visit: Payer: Self-pay

## 2019-01-15 DIAGNOSIS — I350 Nonrheumatic aortic (valve) stenosis: Secondary | ICD-10-CM

## 2019-01-15 NOTE — Progress Notes (Signed)
2D Echocardiogram performed   01/15/19  Joseph Gabrien Mentink RDCS, RVT 

## 2019-01-20 ENCOUNTER — Telehealth: Payer: Self-pay | Admitting: Cardiology

## 2019-01-20 NOTE — Telephone Encounter (Signed)
Has some questions before her appt with BJM

## 2019-01-21 ENCOUNTER — Encounter: Payer: Self-pay | Admitting: *Deleted

## 2019-01-21 MED ORDER — PRAVASTATIN SODIUM 20 MG PO TABS: 40.0000 mg | ORAL_TABLET | Freq: Every day | ORAL | 1 refills | Status: DC

## 2019-01-21 NOTE — Telephone Encounter (Signed)
She can continue her activities

## 2019-01-21 NOTE — Telephone Encounter (Signed)
Patient wants to know if she can still workout and swim in the pool after getting the results of her echocardiogram. She has an upcoming appointment to discuss these results in depth on 01/30/2019. Please advise.  Patient also wanted to make you aware that Dr. Lin Landsman did lab work recently and her total cholesterol level was 252, so he increased her pravastatin from 20 mg to 40 mg daily. FYI.

## 2019-01-21 NOTE — Addendum Note (Signed)
Addended by: Austin Miles on: 01/21/2019 04:37 PM   Modules accepted: Orders

## 2019-01-22 ENCOUNTER — Encounter: Payer: Self-pay | Admitting: Women's Health

## 2019-01-22 NOTE — Telephone Encounter (Signed)
Patient informed and verbalized understanding. No further questions.

## 2019-01-30 ENCOUNTER — Other Ambulatory Visit: Payer: Self-pay

## 2019-01-30 ENCOUNTER — Encounter: Payer: Self-pay | Admitting: Cardiology

## 2019-01-30 ENCOUNTER — Ambulatory Visit (INDEPENDENT_AMBULATORY_CARE_PROVIDER_SITE_OTHER): Payer: Medicare Other | Admitting: Cardiology

## 2019-01-30 VITALS — BP 152/90 | HR 91 | Temp 98.4°F | Ht 59.0 in | Wt 225.2 lb

## 2019-01-30 DIAGNOSIS — E039 Hypothyroidism, unspecified: Secondary | ICD-10-CM | POA: Diagnosis not present

## 2019-01-30 DIAGNOSIS — I350 Nonrheumatic aortic (valve) stenosis: Secondary | ICD-10-CM

## 2019-01-30 DIAGNOSIS — E782 Mixed hyperlipidemia: Secondary | ICD-10-CM | POA: Diagnosis not present

## 2019-01-30 NOTE — Progress Notes (Addendum)
Cardiology Office Note:    Date:  01/30/2019   ID:  [name], DOB 1945-09-20, MRN [id number]  PCP:  Angelina Sheriff, MD  Cardiologist:  Shirlee More, MD    Referring MD: Angelina Sheriff, MD   When she was seen in my office I asked her to go home and give thought to undergoing left and right heart catheterization to begin the process for intervention for aortic stenosis as she is symptomatic.  We had a nice opportunity to discuss the risk benefits and options of left and right heart catheterization she phoned back to my office does not want to wait for follow-up visit and has to be scheduled for the procedure. ASSESSMENT:    1. Nonrheumatic aortic valve stenosis   2. Mixed hyperlipidemia   3. Hypothyroidism, unspecified type    PLAN:    In order of problems listed above:  1. AV stenosis - Severe stenosis on echo 01/15/19 and mild regurgitation.  Aortic stenosis is now symptomatic we discussed the natural history of the disease the indications for intervention benefits and the options of surgical or TAVR and she would like time to think and come back and see me in 2 weeks 2. HLD -continue her current statin dosage for the present time 3. Hypothyroidism - Follows with her PCP. Continue Synthroid.    Next appointment: 2 weeks   Medication Adjustments/Labs and Tests Ordered: Current medicines are reviewed at length with the patient today.  Concerns regarding medicines are outlined above.  Orders Placed This Encounter  Procedures  . EKG 12-Lead   No orders of the defined types were placed in this encounter.   No chief complaint on file. Chief Complaint: 73 yo female presents for follow up of echocardiogram showing severe aortic stenosis.   History of Present Illness:    Tammy Hunt is a 73 y.o. female with a hx of HLD, aortic stenosis, aortic insufficiency, hypothyroidism, and depression last seen 05/23/18. Presents today for follow up after recent echocardiogram.  Echo 01/15/19 with EF 60-65%, impaired relaxation, normal RV, LA mild-mod dilated, AV tricuspid with mild regurgitation and severe stenosis. AV peak gradient 61.3 mmHg and mean gradient 39.0 mmHg.  Stroke-volume index was reduced 33 cc/min velocity time interval ratio 0.36 and valve area between 0.7 and 0.8 cm.  Compliance with diet, lifestyle and medications: Yes  There are several issues on her part first and she feels under a great deal of personal stress in her life right now.  The second is she cannot tolerate a high intensity statin she takes pravastatin and her lipid profile levels not at target was advised to double and is concerned that she will have the same muscle symptoms.  After that discussion I reviewed the results of her echocardiogram and she has severe valvular aortic stenosis.  Initially she felt she was asymptomatic and then started tell me about shortness of breath [facility] but has trouble walking walking inside upstairs and on an incline.  At times she has to stop and rest and she acknowledges that she now has exertional shortness of breath is interfering with her life.  She has had no syncope chest pain edema orthopnea.  I reviewed with her that she now is symptomatic and severe aortic stenosis and told her she should think about having intervention.  She got a little tearful in the office reviewed the option of TAVR she told me she would like time  to think and I told her to come back and see me in 2 weeks.  I did not restrict her activities at this time is important to get out of the home and she never has symptoms during her exercise program.  30 minutes was spent with the patient reviewing her concerns results of her echocardiogram her symptoms discussion of benefits and options for treatment and formulating a shared decision plan Past Medical History:  Diagnosis Date  . Aortic insufficiency   . Depression   . Diverticulitis   . Elevated  cholesterol   . High cholesterol   . Mitral valve regurgitation   . Osteopenia   . Papilloma of breast     Past Surgical History:  Procedure Laterality Date  . BIOPSY THYROID  JAN 2015  . BOWEL RESECTION    . CESAREAN SECTION    . CHOLECYSTECTOMY    . COLONOSCOPY  07/26/2014   Colonic polyps statuas post polypectomy. Mild neo diverticulosis. External hemorrhoids.   . COLOSTOMY TAKEDOWN    . FINGER SURGERY    . TUBAL LIGATION      Current Medications: Current Meds  Medication Sig  . aspirin 81 MG tablet Take 81 mg by mouth daily.  Marland Kitchen buPROPion (WELLBUTRIN XL) 300 MG 24 hr tablet Take 300 mg by mouth daily.   . Calcium Carb-Ergocalciferol 500-200 MG-UNIT TABS Take 1 tablet by mouth daily.   . Cholecalciferol (VITAMIN D PO) Take 1 tablet by mouth 2 (two) times daily.   . clonazePAM (KLONOPIN) 1 MG tablet Take 0.5 mg by mouth as needed.   Marland Kitchen levothyroxine (SYNTHROID, LEVOTHROID) 75 MCG tablet Take 1 tablet (75 mcg total) by mouth daily before breakfast.  . Magnesium 500 MG TABS Take 1 tablet by mouth daily.  . NON FORMULARY Take 1 tablet by mouth daily. Vein Supplement  . Omega-3 Fatty Acids (FISH OIL PO) Take 2 tablets by mouth 2 (two) times daily.   . pravastatin (PRAVACHOL) 20 MG tablet Take 2 tablets (40 mg total) by mouth daily.     Allergies:   Patient has no known allergies.   Social History   Socioeconomic History  . Marital status: Married    Spouse name: Not on file  . Number of children: Not on file  . Years of education: Not on file  . Highest education level: Not on file  Occupational History  . Not on file  Social Needs  . Financial resource strain: Not on file  . Food insecurity    Worry: Not on file    Inability: Not on file  . Transportation needs    Medical: Not on file    Non-medical: Not on file  Tobacco Use  . Smoking status: Former Smoker    Types: Cigarettes  . Smokeless tobacco: Never Used  Substance and Sexual Activity  . Alcohol use: No   . Drug use: No  . Sexual activity: Never    Birth control/protection: Surgical  Lifestyle  . Physical activity    Days per week: Not on file    Minutes per session: Not on file  . Stress: Not on file  Relationships  . Social Herbalist on phone: Not on file    Gets together: Not on file    Attends religious service: Not on file    Active member of club or organization: Not on file    Attends meetings of clubs or organizations: Not on file    Relationship status: Not  on file  Other Topics Concern  . Not on file  Social History Narrative  . Not on file     Family History: The patient's family history includes Breast cancer in her maternal aunt; Cancer in her maternal aunt, maternal grandmother, maternal uncle, and mother; Heart disease in her father; Lung cancer in her mother. ROS:   Please see the history of present illness.    All other systems reviewed and are negative.  EKGs/Labs/Other Studies Reviewed:    The following studies were reviewed today: Echo 01/15/19 IMPRESSIONS  1. The left ventricle has normal systolic function with an ejection fraction of 60-65%. The cavity size was normal. There is moderately increased left ventricular wall thickness. Left ventricular diastolic Doppler parameters are consistent with impaired  relaxation.  2. The right ventricle has normal systolic function. The cavity was normal. There is no increase in right ventricular wall thickness.  3. Left atrial size was mild-moderately dilated.  4. The tricuspid valve is grossly normal.  5. The aortic valve is tricuspid. Moderate calcification of the aortic valve. Aortic valve regurgitation is mild by color flow Doppler. Severe stenosis of the aortic valve.  EKG:  EKG ordered today and personally reviewed.  The ekg ordered today demonstrates sinus rhythm and is normal  Recent Labs:  12/31/18 via [facility]: creatinine 1.1 GFR 48 ALT 27 AST 17 Hb 13.2 K 4.3 TSH 3.593 wbc 6.3 Recent Lipid Panel  12/31/2018 via [facility]: HDL 55 LDL 152 triglycerides 223 total 252  Physical Exam:    VS:  BP (!) 152/90 (BP Location: Right Arm, Patient Position: Sitting, Cuff Size: Large)   Pulse 91   Temp 98.4 F (36.9 C)   Ht 4\' 11"  (1.499 m)   Wt 225 lb 3.2 oz (102.2 kg)   SpO2 97%   BMI 45.48 kg/m     Wt Readings from Last 3 Encounters:  01/30/19 225 lb 3.2 oz (102.2 kg)  09/23/18 223 lb (101.2 kg)  03/28/18 221 lb 6.4 oz (100.4 kg)     GEN:  Well nourished, well developed in no acute distress HEENT: Normal NECK: No JVD; No carotid bruits LYMPHATICS: No lymphadenopathy CARDIAC: Harsh late peaking systolic murmur encompassing the second heart sound grade 3/6 radiating to the carotids RRR, no murmurs, rubs, gallops RESPIRATORY:  Clear to auscultation without rales, wheezing or rhonchi  ABDOMEN: Soft, non-tender, non-distended MUSCULOSKELETAL:  No edema; No deformity  SKIN: Warm and dry NEUROLOGIC:  Alert and oriented x 3 PSYCHIATRIC:  Normal affect    Signed, Shirlee More, MD  01/30/2019 4:31 PM    [facility]

## 2019-01-30 NOTE — Patient Instructions (Signed)
Medication Instructions: Your physician recommends that you continue on your current medications as directed. Please refer to the Current Medication list given to you today.  If you need a refill on your cardiac medications before your next appointment, please call your pharmacy.   Lab work: None If you have labs (blood work) drawn today and your tests are completely normal, you will receive your results only by: Marland Kitchen MyChart Message (if you have MyChart) OR . A paper copy in the mail If you have any lab test that is abnormal or we need to change your treatment, we will call you to review the results.  Testing/Procedures: None  Follow-Up: At [facility], you and your health needs are our priority.  As part of our continuing mission to provide you with exceptional heart care, we have created designated Provider Care Teams.  These Care Teams include your primary Cardiologist (physician) and Advanced Practice Providers (APPs -  Physician Assistants and Nurse Practitioners) who all work together to provide you with the care you need, when you need it. You will need a follow up appointment in 2 weeks.   Any Other Special Instructions Will Be Listed Below (If Applicable).

## 2019-01-30 NOTE — H&P (View-Only) (Signed)
Cardiology Office Note:    Date:  01/30/2019   ID:  Tammy Hunt, DOB 1945-12-03, MRN [id number]  PCP:  Angelina Sheriff, MD  Cardiologist:  Shirlee More, MD    Referring MD: Angelina Sheriff, MD   When she was seen in my office I asked her to go home and give thought to undergoing left and right heart catheterization to begin the process for intervention for aortic stenosis as she is symptomatic.  We had a nice opportunity to discuss the risk benefits and options of left and right heart catheterization she phoned back to my office does not want to wait for follow-up visit and has to be scheduled for the procedure. ASSESSMENT:    1. Nonrheumatic aortic valve stenosis   2. Mixed hyperlipidemia   3. Hypothyroidism, unspecified type    PLAN:    In order of problems listed above:  1. AV stenosis - Severe stenosis on echo 01/15/19 and mild regurgitation.  Aortic stenosis is now symptomatic we discussed the natural history of the disease the indications for intervention benefits and the options of surgical or TAVR and she would like time to think and come back and see me in 2 weeks 2. HLD -continue her current statin dosage for the present time 3. Hypothyroidism - Follows with her PCP. Continue Synthroid.    Next appointment: 2 weeks   Medication Adjustments/Labs and Tests Ordered: Current medicines are reviewed at length with the patient today.  Concerns regarding medicines are outlined above.  Orders Placed This Encounter  Procedures  . EKG 12-Lead   No orders of the defined types were placed in this encounter.   No chief complaint on file. Chief Complaint: 73 yo female presents for follow up of echocardiogram showing severe aortic stenosis.   History of Present Illness:    Tammy Hunt is a 73 y.o. female with a hx of HLD, aortic stenosis, aortic insufficiency, hypothyroidism, and depression last seen 05/23/18. Presents today for follow up after recent echocardiogram.  Echo 01/15/19 with EF 60-65%, impaired relaxation, normal RV, LA mild-mod dilated, AV tricuspid with mild regurgitation and severe stenosis. AV peak gradient 61.3 mmHg and mean gradient 39.0 mmHg.  Stroke-volume index was reduced 33 cc/min velocity time interval ratio 0.36 and valve area between 0.7 and 0.8 cm.  Compliance with diet, lifestyle and medications: Yes  There are several issues on her part first and she feels under a great deal of personal stress in her life right now.  The second is she cannot tolerate a high intensity statin she takes pravastatin and her lipid profile levels not at target was advised to double and is concerned that she will have the same muscle symptoms.  After that discussion I reviewed the results of her echocardiogram and she has severe valvular aortic stenosis.  Initially she felt she was asymptomatic and then started tell me about shortness of breath [facility] but has trouble walking walking inside upstairs and on an incline.  At times she has to stop and rest and she acknowledges that she now has exertional shortness of breath is interfering with her life.  She has had no syncope chest pain edema orthopnea.  I reviewed with her that she now is symptomatic and severe aortic stenosis and told her she should think about having intervention.  She got a little tearful in the office reviewed the option of TAVR she told me she would like time  to think and I told her to come back and see me in 2 weeks.  I did not restrict her activities at this time is important to get out of the home and she never has symptoms during her exercise program.  30 minutes was spent with the patient reviewing her concerns results of her echocardiogram her symptoms discussion of benefits and options for treatment and formulating a shared decision plan Past Medical History:  Diagnosis Date  . Aortic insufficiency   . Depression   . Diverticulitis   . Elevated  cholesterol   . High cholesterol   . Mitral valve regurgitation   . Osteopenia   . Papilloma of breast     Past Surgical History:  Procedure Laterality Date  . BIOPSY THYROID  JAN 2015  . BOWEL RESECTION    . CESAREAN SECTION    . CHOLECYSTECTOMY    . COLONOSCOPY  07/26/2014   Colonic polyps statuas post polypectomy. Mild neo diverticulosis. External hemorrhoids.   . COLOSTOMY TAKEDOWN    . FINGER SURGERY    . TUBAL LIGATION      Current Medications: Current Meds  Medication Sig  . aspirin 81 MG tablet Take 81 mg by mouth daily.  Marland Kitchen buPROPion (WELLBUTRIN XL) 300 MG 24 hr tablet Take 300 mg by mouth daily.   . Calcium Carb-Ergocalciferol 500-200 MG-UNIT TABS Take 1 tablet by mouth daily.   . Cholecalciferol (VITAMIN D PO) Take 1 tablet by mouth 2 (two) times daily.   . clonazePAM (KLONOPIN) 1 MG tablet Take 0.5 mg by mouth as needed.   Marland Kitchen levothyroxine (SYNTHROID, LEVOTHROID) 75 MCG tablet Take 1 tablet (75 mcg total) by mouth daily before breakfast.  . Magnesium 500 MG TABS Take 1 tablet by mouth daily.  . NON FORMULARY Take 1 tablet by mouth daily. Vein Supplement  . Omega-3 Fatty Acids (FISH OIL PO) Take 2 tablets by mouth 2 (two) times daily.   . pravastatin (PRAVACHOL) 20 MG tablet Take 2 tablets (40 mg total) by mouth daily.     Allergies:   Patient has no known allergies.   Social History   Socioeconomic History  . Marital status: Married    Spouse name: Not on file  . Number of children: Not on file  . Years of education: Not on file  . Highest education level: Not on file  Occupational History  . Not on file  Social Needs  . Financial resource strain: Not on file  . Food insecurity    Worry: Not on file    Inability: Not on file  . Transportation needs    Medical: Not on file    Non-medical: Not on file  Tobacco Use  . Smoking status: Former Smoker    Types: Cigarettes  . Smokeless tobacco: Never Used  Substance and Sexual Activity  . Alcohol use: No   . Drug use: No  . Sexual activity: Never    Birth control/protection: Surgical  Lifestyle  . Physical activity    Days per week: Not on file    Minutes per session: Not on file  . Stress: Not on file  Relationships  . Social Herbalist on phone: Not on file    Gets together: Not on file    Attends religious service: Not on file    Active member of club or organization: Not on file    Attends meetings of clubs or organizations: Not on file    Relationship status: Not  on file  Other Topics Concern  . Not on file  Social History Narrative  . Not on file     Family History: The patient's family history includes Breast cancer in her maternal aunt; Cancer in her maternal aunt, maternal grandmother, maternal uncle, and mother; Heart disease in her father; Lung cancer in her mother. ROS:   Please see the history of present illness.    All other systems reviewed and are negative.  EKGs/Labs/Other Studies Reviewed:    The following studies were reviewed today: Echo 01/15/19 IMPRESSIONS  1. The left ventricle has normal systolic function with an ejection fraction of 60-65%. The cavity size was normal. There is moderately increased left ventricular wall thickness. Left ventricular diastolic Doppler parameters are consistent with impaired  relaxation.  2. The right ventricle has normal systolic function. The cavity was normal. There is no increase in right ventricular wall thickness.  3. Left atrial size was mild-moderately dilated.  4. The tricuspid valve is grossly normal.  5. The aortic valve is tricuspid. Moderate calcification of the aortic valve. Aortic valve regurgitation is mild by color flow Doppler. Severe stenosis of the aortic valve.  EKG:  EKG ordered today and personally reviewed.  The ekg ordered today demonstrates sinus rhythm and is normal  Recent Labs:  12/31/18 via [facility]: creatinine 1.1 GFR 48 ALT 27 AST 17 Hb 13.2 K 4.3 TSH 3.593 wbc 6.3 Recent Lipid Panel  12/31/2018 via [facility]: HDL 55 LDL 152 triglycerides 223 total 252  Physical Exam:    VS:  BP (!) 152/90 (BP Location: Right Arm, Patient Position: Sitting, Cuff Size: Large)   Pulse 91   Temp 98.4 F (36.9 C)   Ht 4\' 11"  (1.499 m)   Wt 225 lb 3.2 oz (102.2 kg)   SpO2 97%   BMI 45.48 kg/m     Wt Readings from Last 3 Encounters:  01/30/19 225 lb 3.2 oz (102.2 kg)  09/23/18 223 lb (101.2 kg)  03/28/18 221 lb 6.4 oz (100.4 kg)     GEN:  Well nourished, well developed in no acute distress HEENT: Normal NECK: No JVD; No carotid bruits LYMPHATICS: No lymphadenopathy CARDIAC: Harsh late peaking systolic murmur encompassing the second heart sound grade 3/6 radiating to the carotids RRR, no murmurs, rubs, gallops RESPIRATORY:  Clear to auscultation without rales, wheezing or rhonchi  ABDOMEN: Soft, non-tender, non-distended MUSCULOSKELETAL:  No edema; No deformity  SKIN: Warm and dry NEUROLOGIC:  Alert and oriented x 3 PSYCHIATRIC:  Normal affect    Signed, Shirlee More, MD  01/30/2019 4:31 PM    [facility]

## 2019-02-02 ENCOUNTER — Telehealth: Payer: Self-pay | Admitting: *Deleted

## 2019-02-02 DIAGNOSIS — Z01812 Encounter for preprocedural laboratory examination: Secondary | ICD-10-CM

## 2019-02-02 NOTE — Telephone Encounter (Signed)
Pt is calling about surgery that Dr. Bettina Gavia discussed with her. She is ready for the aortic valve replacement. Please advise.

## 2019-02-02 NOTE — Telephone Encounter (Signed)
Patient ready to proceed to next step. Please advise. Thanks

## 2019-02-03 NOTE — Telephone Encounter (Signed)
Please set up left and right heart catheterization with Dr. Burt Knack

## 2019-02-03 NOTE — Telephone Encounter (Signed)
 @  [place] AT [facility] [place] [address] Dept: [phone] Loc: [place]  02/03/2019  You are scheduled for a Cardiac Catheterization on Friday, July 31 with Dr. Sherren Mocha.  1. Please arrive at the [facility] (Main Entrance A) at [facility]: [address] [place], [place] [address] at 7:00 AM (This time is two hours before your procedure to ensure your preparation). Free valet parking service is available.   Special note: Every effort is made to have your procedure done on time. Please understand that emergencies sometimes delay scheduled procedures.  2. Diet: Do not eat solid foods after midnight.  The patient may have clear liquids until 5am upon the day of the procedure.  3. Labs:  This week : CBC,BMP . Medication instructions in preparation for your procedure:  *For reference purposes while preparing patient instructions.   Delete this med list prior to printing instructions for patient.*    On the morning of your procedure, take your Aspirin and any morning medicines NOT listed above.  You may use sips of water.  5. Plan for one night stay--bring personal belongings. 6. Bring a current list of your medications and current insurance cards. 7. You MUST have a responsible person to drive you home. 8. Someone MUST be with you the first 24 hours after you arrive home or your discharge will be delayed. 9. Please wear clothes that are easy to get on and off and wear slip-on shoes.  Thank you for allowing [name] to care for you!   -- [facility]     Patient called and given instructions by phone. Patient verbalized understanding.

## 2019-02-03 NOTE — Addendum Note (Signed)
Addended by: Shirlee More on: 02/03/2019 07:47 AM   Modules accepted: Orders, SmartSet

## 2019-02-03 NOTE — Addendum Note (Signed)
Addended by: Particia Nearing B on: 02/03/2019 09:21 AM   Modules accepted: Orders

## 2019-02-04 ENCOUNTER — Telehealth: Payer: Self-pay | Admitting: *Deleted

## 2019-02-04 LAB — CBC
Hematocrit: 40.7 % (ref 34.0–46.6)
Hemoglobin: 13.7 g/dL (ref 11.1–15.9)
MCH: 28.6 pg (ref 26.6–33.0)
MCHC: 33.7 g/dL (ref 31.5–35.7)
MCV: 85 fL (ref 79–97)
Platelets: 250 10*3/uL (ref 150–450)
RBC: 4.79 x10E6/uL (ref 3.77–5.28)
RDW: 14 % (ref 11.7–15.4)
WBC: 5.4 10*3/uL (ref 3.4–10.8)

## 2019-02-04 LAB — BASIC METABOLIC PANEL
BUN/Creatinine Ratio: 12 (ref 12–28)
BUN: 13 mg/dL (ref 8–27)
CO2: 23 mmol/L (ref 20–29)
Calcium: 9.7 mg/dL (ref 8.7–10.3)
Chloride: 105 mmol/L (ref 96–106)
Creatinine, Ser: 1.09 mg/dL — ABNORMAL HIGH (ref 0.57–1.00)
GFR calc Af Amer: 59 mL/min/{1.73_m2} — ABNORMAL LOW (ref >59)
GFR calc non Af Amer: 51 mL/min/{1.73_m2} — ABNORMAL LOW (ref >59)
Glucose: 101 mg/dL — ABNORMAL HIGH (ref 65–99)
Potassium: 4.5 mmol/L (ref 3.5–5.2)
Sodium: 144 mmol/L (ref 134–144)

## 2019-02-04 NOTE — Telephone Encounter (Signed)
Pt called because she got email to go to [facility] for Covid testing but Mickel Baas had told her to go to [place]. Checked with Mickel Baas and she clarified that the testing will start to be done at [facility] on the 27th. Let pt know and she verbalized understanding.

## 2019-02-04 NOTE — Addendum Note (Signed)
Addended byShirlee More on: 02/04/2019 07:46 AM   Modules accepted: Orders

## 2019-02-09 ENCOUNTER — Other Ambulatory Visit (HOSPITAL_COMMUNITY): Payer: Medicare Other

## 2019-02-10 ENCOUNTER — Other Ambulatory Visit (HOSPITAL_COMMUNITY)
Admission: RE | Admit: 2019-02-10 | Discharge: 2019-02-10 | Disposition: A | Discharge status: Home / Self Care | Payer: Medicare Other | Source: Ambulatory Visit | Attending: Cardiovascular Disease | Admitting: Cardiovascular Disease

## 2019-02-10 DIAGNOSIS — Z20828 Contact with and (suspected) exposure to other viral communicable diseases: Secondary | ICD-10-CM | POA: Diagnosis present

## 2019-02-10 LAB — SARS CORONAVIRUS 2 (TAT 6-24 HRS): SARS Coronavirus 2: NEGATIVE

## 2019-02-11 ENCOUNTER — Telehealth: Payer: Self-pay | Admitting: Cardiology

## 2019-02-11 NOTE — Telephone Encounter (Signed)
They will give at [facility]

## 2019-02-11 NOTE — Telephone Encounter (Signed)
Patient is having a cardiac cath Friday and wants to make sure we are aware she has had 3 abdominal surgeries and has a lot of scar tissue.  She also wants to know if she can take something "to calm herself" before going to the hospital.  Please call patient to discuss

## 2019-02-11 NOTE — Telephone Encounter (Signed)
They will give at [facility]

## 2019-02-11 NOTE — Telephone Encounter (Signed)
Information relayed and patient concerns addressed. No further questions at this time.

## 2019-02-12 ENCOUNTER — Telehealth: Payer: Self-pay | Admitting: *Deleted

## 2019-02-12 NOTE — Telephone Encounter (Signed)
Pt contacted pre-catheterization scheduled at [facility] for: Friday February 13, 2019 9 AM Verified arrival time and place: [place] Entrance A at: 7 AM  Covid-19 test date: 02/10/19  No solid food after midnight prior to cath, clear liquids until 5 AM day of procedure. Contrast allergy: no  AM meds can be  taken pre-cath with sip of water including: ASA 81 mg   Confirmed patient has responsible person to drive home post procedure and observe 24 hours after arriving home: yes  Due to Covid-19 pandemic, only one support person will be allowed with patient. Must be the same support person for that patient's entire stay, will be screened and required to wear a mask.   Patients are required to wear a mask when they enter the hospital.       COVID-19 Pre-Screening Questions:  . In the past 7 to 10 days have you had a cough,  shortness of breath, headache, congestion, fever (100 or greater) body aches, chills, sore throat, or sudden loss of taste or sense of smell? no . Have you been around anyone with known Covid 19? no . Have you been around anyone who is awaiting Covid 19 test results in the past 7 to 10 days? . Have you been around anyone who has been exposed to Covid 19, or has mentioned symptoms of Covid 19 within the past 7 to 10 days? no  I reviewed procedure, mask/visitor, Covid-19 screening questions with patient, she verbalized understanding, thanked me for call.

## 2019-02-13 ENCOUNTER — Encounter (HOSPITAL_COMMUNITY): Payer: Self-pay | Admitting: Cardiovascular Disease

## 2019-02-13 ENCOUNTER — Ambulatory Visit (HOSPITAL_COMMUNITY)
Admission: RE | Admit: 2019-02-13 | Discharge: 2019-02-13 | Disposition: A | Discharge status: Home / Self Care | Payer: Medicare Other | Attending: Cardiovascular Disease | Admitting: Cardiovascular Disease

## 2019-02-13 ENCOUNTER — Other Ambulatory Visit: Payer: Self-pay

## 2019-02-13 ENCOUNTER — Encounter (HOSPITAL_COMMUNITY)
Admission: RE | Disposition: A | Discharge status: Home / Self Care | Payer: Self-pay | Source: Home / Self Care | Attending: Cardiovascular Disease

## 2019-02-13 DIAGNOSIS — I2510 Atherosclerotic heart disease of native coronary artery without angina pectoris: Secondary | ICD-10-CM | POA: Diagnosis not present

## 2019-02-13 DIAGNOSIS — I350 Nonrheumatic aortic (valve) stenosis: Secondary | ICD-10-CM | POA: Diagnosis present

## 2019-02-13 DIAGNOSIS — I2584 Coronary atherosclerosis due to calcified coronary lesion: Secondary | ICD-10-CM | POA: Diagnosis not present

## 2019-02-13 DIAGNOSIS — Z87891 Personal history of nicotine dependence: Secondary | ICD-10-CM | POA: Insufficient documentation

## 2019-02-13 DIAGNOSIS — Z7982 Long term (current) use of aspirin: Secondary | ICD-10-CM | POA: Diagnosis not present

## 2019-02-13 DIAGNOSIS — Z79890 Hormone replacement therapy (postmenopausal): Secondary | ICD-10-CM | POA: Diagnosis not present

## 2019-02-13 DIAGNOSIS — I352 Nonrheumatic aortic (valve) stenosis with insufficiency: Secondary | ICD-10-CM | POA: Diagnosis not present

## 2019-02-13 DIAGNOSIS — Z8249 Family history of ischemic heart disease and other diseases of the circulatory system: Secondary | ICD-10-CM | POA: Diagnosis not present

## 2019-02-13 DIAGNOSIS — M8580 Other specified disorders of bone density and structure, unspecified site: Secondary | ICD-10-CM | POA: Insufficient documentation

## 2019-02-13 DIAGNOSIS — Z9851 Tubal ligation status: Secondary | ICD-10-CM | POA: Diagnosis not present

## 2019-02-13 DIAGNOSIS — E039 Hypothyroidism, unspecified: Secondary | ICD-10-CM | POA: Diagnosis not present

## 2019-02-13 DIAGNOSIS — E782 Mixed hyperlipidemia: Secondary | ICD-10-CM | POA: Diagnosis not present

## 2019-02-13 DIAGNOSIS — Z79899 Other long term (current) drug therapy: Secondary | ICD-10-CM | POA: Insufficient documentation

## 2019-02-13 DIAGNOSIS — R0602 Shortness of breath: Secondary | ICD-10-CM

## 2019-02-13 HISTORY — PX: LEFT HEART CATH AND CORONARY ANGIOGRAPHY: CATH118249

## 2019-02-13 SURGERY — LEFT HEART CATH AND CORONARY ANGIOGRAPHY
Anesthesia: LOCAL

## 2019-02-13 MED ORDER — IOHEXOL 350 MG/ML SOLN
INTRAVENOUS | Status: DC | PRN
  Administered 2019-02-13: 60 mL via INTRA_ARTERIAL

## 2019-02-13 MED ORDER — LIDOCAINE HCL (PF) 1 % IJ SOLN
INTRAMUSCULAR | Status: DC | PRN
  Administered 2019-02-13 (×2): 2 mL

## 2019-02-13 MED ORDER — HYDRALAZINE HCL 20 MG/ML IJ SOLN: 10.0000 mg | INTRAMUSCULAR | Status: DC | PRN

## 2019-02-13 MED ORDER — MIDAZOLAM HCL 2 MG/2ML IJ SOLN
INTRAMUSCULAR | Status: AC
  Filled 2019-02-13: qty 2

## 2019-02-13 MED ORDER — VERAPAMIL HCL 2.5 MG/ML IV SOLN
INTRAVENOUS | Status: DC | PRN
  Administered 2019-02-13: 09:00:00 10 mL via INTRA_ARTERIAL

## 2019-02-13 MED ORDER — SODIUM CHLORIDE 0.9 % WEIGHT BASED INFUSION: 1.0000 mL/kg/h | INTRAVENOUS | Status: DC

## 2019-02-13 MED ORDER — FENTANYL CITRATE (PF) 100 MCG/2ML IJ SOLN
INTRAMUSCULAR | Status: DC | PRN
  Administered 2019-02-13: 25 ug via INTRAVENOUS

## 2019-02-13 MED ORDER — HEPARIN (PORCINE) IN NACL 1000-0.9 UT/500ML-% IV SOLN
INTRAVENOUS | Status: AC
  Filled 2019-02-13: qty 1000

## 2019-02-13 MED ORDER — HEPARIN SODIUM (PORCINE) 1000 UNIT/ML IJ SOLN
INTRAMUSCULAR | Status: AC
  Filled 2019-02-13: qty 1

## 2019-02-13 MED ORDER — FENTANYL CITRATE (PF) 100 MCG/2ML IJ SOLN
INTRAMUSCULAR | Status: AC
  Filled 2019-02-13: qty 2

## 2019-02-13 MED ORDER — ONDANSETRON HCL 4 MG/2ML IJ SOLN: 4.0000 mg | Freq: Four times a day (QID) | INTRAMUSCULAR | Status: DC | PRN

## 2019-02-13 MED ORDER — MIDAZOLAM HCL 2 MG/2ML IJ SOLN
INTRAMUSCULAR | Status: DC | PRN
  Administered 2019-02-13: 2 mg via INTRAVENOUS

## 2019-02-13 MED ORDER — VERAPAMIL HCL 2.5 MG/ML IV SOLN
INTRAVENOUS | Status: AC
  Filled 2019-02-13: qty 2

## 2019-02-13 MED ORDER — SODIUM CHLORIDE 0.9% FLUSH: 3.0000 mL | Freq: Two times a day (BID) | INTRAVENOUS | Status: DC

## 2019-02-13 MED ORDER — LIDOCAINE HCL (PF) 1 % IJ SOLN
INTRAMUSCULAR | Status: AC
  Filled 2019-02-13: qty 30

## 2019-02-13 MED ORDER — SODIUM CHLORIDE 0.9% FLUSH: 3.0000 mL | INTRAVENOUS | Status: DC | PRN

## 2019-02-13 MED ORDER — ACETAMINOPHEN 325 MG PO TABS: 650.0000 mg | ORAL_TABLET | ORAL | Status: DC | PRN

## 2019-02-13 MED ORDER — SODIUM CHLORIDE 0.9 % IV SOLN: 250.0000 mL | INTRAVENOUS | Status: DC | PRN

## 2019-02-13 MED ORDER — ASPIRIN 81 MG PO CHEW: 81.0000 mg | CHEWABLE_TABLET | ORAL | Status: DC

## 2019-02-13 MED ORDER — LABETALOL HCL 5 MG/ML IV SOLN: 10.0000 mg | INTRAVENOUS | Status: DC | PRN

## 2019-02-13 MED ORDER — SODIUM CHLORIDE 0.9 % WEIGHT BASED INFUSION
3.0000 mL/kg/h | INTRAVENOUS | Status: AC
  Administered 2019-02-13: 3 mL/kg/h via INTRAVENOUS

## 2019-02-13 MED ORDER — HEPARIN SODIUM (PORCINE) 1000 UNIT/ML IJ SOLN
INTRAMUSCULAR | Status: DC | PRN
  Administered 2019-02-13: 5000 [IU] via INTRAVENOUS

## 2019-02-13 MED ORDER — HEPARIN (PORCINE) IN NACL 1000-0.9 UT/500ML-% IV SOLN
INTRAVENOUS | Status: DC | PRN
  Administered 2019-02-13 (×2): 500 mL

## 2019-02-13 SURGICAL SUPPLY — 12 items
CATH 5FR JL3.5 JR4 ANG PIG MP (CATHETERS) ×1 IMPLANT
COVER DOME SNAP 22 D (MISCELLANEOUS) ×2 IMPLANT
DEVICE RAD COMP TR BAND LRG (VASCULAR PRODUCTS) ×2 IMPLANT
GUIDEWIRE ANGLED .035X150CM (WIRE) ×1 IMPLANT
GUIDEWIRE INQWIRE 1.5J.035X260 (WIRE) ×1 IMPLANT
INQWIRE 1.5J .035X260CM (WIRE) ×2
KIT HEART LEFT (KITS) ×2 IMPLANT
PACK CARDIAC CATHETERIZATION (CUSTOM PROCEDURE TRAY) ×2 IMPLANT
SHEATH RAIN 4/5FR (SHEATH) ×2 IMPLANT
SHEATH RAIN RADIAL 21G 6FR (SHEATH) ×1 IMPLANT
TRANSDUCER W/STOPCOCK (MISCELLANEOUS) ×2 IMPLANT
TUBING CIL FLEX 10 FLL-RA (TUBING) ×2 IMPLANT

## 2019-02-13 NOTE — Research (Signed)
[facility] Informed Consent   Subject Name: Tammy Hunt  Subject met inclusion and exclusion criteria.  The informed consent form, study requirements and expectations were reviewed with the subject and questions and concerns were addressed prior to the signing of the consent form.  The subject verbalized understanding of the trail requirements.  The subject agreed to participate in the [facility] trial and signed the informed consent.  The informed consent was obtained prior to performance of any protocol-specific procedures for the subject.  A copy of the signed informed consent was given to the subject and a copy was placed in the subject's medical record.  Philemon Kingdom D 02/13/2019, [phone]

## 2019-02-13 NOTE — Progress Notes (Signed)
TRB BAND REMOVAL  LOCATION:    right radial  DEFLATED PER PROTOCOL:    Yes.    TIME BAND OFF / DRESSING APPLIED:    1130   SITE UPON ARRIVAL:    Level 0  SITE AFTER BAND REMOVAL:    Level 0  CIRCULATION SENSATION AND MOVEMENT:    Within Normal Limits   Yes.

## 2019-02-13 NOTE — Interval H&P Note (Signed)
History and Physical Interval Note:  02/13/2019 8:48 AM  Tammy Hunt  has presented today for surgery, with the diagnosis of pre tavr - aortic valve stenosis.  The various methods of treatment have been discussed with the patient and family. After consideration of risks, benefits and other options for treatment, the patient has consented to  Procedure(s): RIGHT/LEFT HEART CATH AND CORONARY ANGIOGRAPHY (N/A) as a surgical intervention.  The patient's history has been reviewed, patient examined, no change in status, stable for surgery.  I have reviewed the patient's chart and labs.  Questions were answered to the patient's satisfaction.     Sherren Mocha

## 2019-02-13 NOTE — Discharge Instructions (Signed)
DRINK PLENTY OF FLUIDS FOR THE NEXT 2-3 DAYS TO KEEP HYDRATED.                                                              KEEP AFFECTED ARM ELEVATED FOR THE REMAINDER OF THE DAY.  Radial Site Care  This sheet gives you information about how to care for yourself after your procedure. Your health care provider may also give you more specific instructions. If you have problems or questions, contact your health care provider. What can I expect after the procedure? After the procedure, it is common to have:  Bruising and tenderness at the catheter insertion area. Follow these instructions at home: Medicines  Take over-the-counter and prescription medicines only as told by your health care provider. Insertion site care  Follow instructions from your health care provider about how to take care of your insertion site. Make sure you: ? Wash your hands with soap and water before you change your bandage (dressing). If soap and water are not available, use hand sanitizer. ? Change your dressing as told by your health care provider.  Check your insertion site every day for signs of infection. Check for: ? Redness, swelling, or pain. ? Fluid or blood. ? Pus or a bad smell. ? Warmth.  Do not take baths, swim, or use a hot tub for 5 days.  You may shower 24-48 hours after the procedure, or as directed by your health care provider. ? Remove the dressing and gently wash the site with plain soap and water. ? Pat the area dry with a clean towel. ? Do not rub the site. That could cause bleeding.  Do not apply powder or lotion to the site.  Activity  For 24 hours after the procedure, or as directed by your health care provider: ? Do not flex or bend the affected arm. ? Do not push or pull heavy objects with the affected arm. ? Do not drive yourself home from the hospital or clinic. You may drive 24 hours after the procedure unless your health care provider tells you not to. ? Do not  operate machinery or power tools.  Do not lift anything that is heavier than 10 lb for 5 days.  Ask your health care provider when it is okay to: ? Return to work or school. ? Resume usual physical activities or sports. ? Resume sexual activity. General instructions  If the catheter site starts to bleed, raise your arm and put firm pressure on the site. If the bleeding does not stop, get help right away. This is a medical emergency.  If you went home on the same day as your procedure, a responsible adult should be with you for the first 24 hours after you arrive home.  Keep all follow-up visits as told by your health care provider. This is important. Contact a health care provider if:  You have a fever.  You have redness, swelling, or yellow drainage around your insertion site. Get help right away if:  You have unusual pain at the radial site.  The catheter insertion area swells very fast.  The insertion area is bleeding, and the bleeding does not stop when you hold steady pressure on the area.  Your arm or hand becomes pale,  cool, tingly, or numb. These symptoms may represent a serious problem that is an emergency. Do not wait to see if the symptoms will go away. Get medical help right away. Call your local emergency services (911 in the [place] Do not drive yourself to the hospital. Summary  After the procedure, it is common to have bruising and tenderness at the site.  Follow instructions from your health care provider about how to take care of your radial site wound. Check the wound every day for signs of infection.  Do not lift anything that is heavier than 10 lb for 5 days.  This information is not intended to replace advice given to you by your health care provider. Make sure you discuss any questions you have with your health care provider. Document Released: 08/04/2010 Document Revised: 08/07/2017 Document Reviewed: 08/07/2017 Elsevier Patient Education  2020 [employer].

## 2019-02-16 ENCOUNTER — Other Ambulatory Visit: Payer: Self-pay

## 2019-02-16 MED ORDER — METOPROLOL TARTRATE 50 MG PO TABS: ORAL_TABLET | ORAL | 0 refills | Status: DC

## 2019-02-19 ENCOUNTER — Ambulatory Visit: Payer: Medicare Other | Admitting: Cardiology

## 2019-02-20 ENCOUNTER — Ambulatory Visit (HOSPITAL_COMMUNITY)
Admission: RE | Admit: 2019-02-20 | Discharge: 2019-02-20 | Disposition: A | Discharge status: Home / Self Care | Payer: Medicare Other | Source: Ambulatory Visit | Attending: Cardiovascular Disease | Admitting: Cardiovascular Disease

## 2019-02-20 ENCOUNTER — Other Ambulatory Visit: Payer: Self-pay

## 2019-02-20 ENCOUNTER — Ambulatory Visit (HOSPITAL_BASED_OUTPATIENT_CLINIC_OR_DEPARTMENT_OTHER)
Admission: RE | Admit: 2019-02-20 | Discharge: 2019-02-20 | Disposition: A | Discharge status: Home / Self Care | Payer: Medicare Other | Source: Ambulatory Visit | Attending: Cardiovascular Disease | Admitting: Cardiovascular Disease

## 2019-02-20 ENCOUNTER — Ambulatory Visit (HOSPITAL_COMMUNITY): Admission: RE | Admit: 2019-02-20 | Payer: Medicare Other | Source: Ambulatory Visit

## 2019-02-20 DIAGNOSIS — R0602 Shortness of breath: Secondary | ICD-10-CM

## 2019-02-20 DIAGNOSIS — I350 Nonrheumatic aortic (valve) stenosis: Secondary | ICD-10-CM

## 2019-02-20 MED ORDER — IOHEXOL 350 MG/ML SOLN
100.0000 mL | Freq: Once | INTRAVENOUS | Status: AC | PRN
  Administered 2019-02-20: 12:00:00 100 mL via INTRAVENOUS

## 2019-03-02 ENCOUNTER — Encounter: Payer: Self-pay | Admitting: Physician Assistant

## 2019-03-04 ENCOUNTER — Ambulatory Visit: Payer: Medicare Other | Admitting: Physical Therapy

## 2019-03-04 ENCOUNTER — Encounter: Payer: Self-pay | Admitting: Surgery

## 2019-03-04 ENCOUNTER — Institutional Professional Consult (permissible substitution): Payer: Medicare Other | Admitting: Surgery

## 2019-03-04 ENCOUNTER — Telehealth: Payer: Self-pay | Admitting: Cardiology

## 2019-03-04 ENCOUNTER — Other Ambulatory Visit: Payer: Self-pay

## 2019-03-04 VITALS — BP 156/84 | HR 81 | Temp 97.7°F | Resp 16 | Ht 59.0 in | Wt 225.0 lb

## 2019-03-04 DIAGNOSIS — I350 Nonrheumatic aortic (valve) stenosis: Secondary | ICD-10-CM

## 2019-03-04 NOTE — Progress Notes (Signed)
Patient ID: Tammy Hunt, female   DOB: 22-Mar-1946, 73 y.o.   MRN: [id number]  [facility] CONSULTATION REPORT  Referring Provider is Richardo Priest, MD Primary Cardiologist is No primary care provider on file. PCP is Angelina Sheriff, MD  Chief Complaint  Patient presents with   Aortic Stenosis    TAVR EVAL wih reivew of all required studies/procedures    HPI:  The patient is a 73 year old woman with a history of hyperlipidemia, hypothyroidism, and aortic stenosis with aortic insufficiency who has been followed by Dr. Bettina Gavia with serial echocardiograms.  August 2016 she had a gradient of 18 mmHg and then 03/2017 her gradient increased to 28 mmHg.  Her most recent echocardiogram on 01/15/2019 showed an increase in the mean gradient to 39 mmHg with a peak gradient of 61 mmHg and an aortic valve area of 0.84 cm.  Left ventricular systolic function was normal with ejection fraction of 60 to 65%.  There is mild aortic insufficiency.  Cardiac catheterization on 02/13/2019 showed mild nonobstructive coronary disease involving the proximal LAD and right coronary arteries.  The mean gradient across aortic valve was 33 mmHg with a peak gradient of 44 mmHg.  The patient is still relatively active and goes to the [facility] doing aquatics.  She has noticed development of progressive exertional fatigue and shortness of breath.  She denies any dizziness or syncope.  She has had no chest pain or pressure.  She has had no orthopnea.  She denies any peripheral edema.  Past Medical History:  Diagnosis Date   Depression    Diverticulitis    High cholesterol    Mitral valve regurgitation    Osteopenia    Papilloma of breast    Severe aortic stenosis     Past Surgical History:  Procedure Laterality Date   BIOPSY THYROID  JAN 2015   BOWEL RESECTION     CESAREAN SECTION     CHOLECYSTECTOMY     COLONOSCOPY  07/26/2014    Colonic polyps statuas post polypectomy. Mild neo diverticulosis. External hemorrhoids.    COLOSTOMY TAKEDOWN     FINGER SURGERY     LEFT HEART CATH AND CORONARY ANGIOGRAPHY N/A 02/13/2019   Procedure: LEFT HEART CATH AND CORONARY ANGIOGRAPHY;  Surgeon: Sherren Mocha, MD;  Location: [facility];  Service: Cardiovascular;  Laterality: N/A;   TUBAL LIGATION      Family History  Problem Relation Age of Onset   Lung cancer Mother    Cancer Mother        lung-smoker   Heart disease Father    Cancer Maternal Grandmother        liver   Breast cancer Maternal Aunt        age 68   Cancer Maternal Aunt        lymphoma   Cancer Maternal Uncle        lung    Social History   Socioeconomic History   Marital status: Married    Spouse name: Not on file   Number of children: Not on file   Years of education: Not on file   Highest education level: Not on file  Occupational History   Not on file  Social Needs   Financial resource strain: Not on file   Food insecurity    Worry: Not on file    Inability: Not on file   Transportation needs    Medical:  Not on file    Non-medical: Not on file  Tobacco Use   Smoking status: Former Smoker    Types: Cigarettes   Smokeless tobacco: Never Used  Substance and Sexual Activity   Alcohol use: No   Drug use: No   Sexual activity: Never    Birth control/protection: Surgical  Lifestyle   Physical activity    Days per week: Not on file    Minutes per session: Not on file   Stress: Not on file  Relationships   Social connections    Talks on phone: Not on file    Gets together: Not on file    Attends religious service: Not on file    Active member of club or organization: Not on file    Attends meetings of clubs or organizations: Not on file    Relationship status: Not on file   Intimate partner violence    Fear of current or ex partner: Not on file    Emotionally abused: Not on file    Physically  abused: Not on file    Forced sexual activity: Not on file  Other Topics Concern   Not on file  Social History Narrative   Not on file    Current Outpatient Medications  Medication Sig Dispense Refill   aspirin 81 MG tablet Take 81 mg by mouth 2 (two) times a day.      buPROPion (WELLBUTRIN XL) 300 MG 24 hr tablet Take 300 mg by mouth daily.   0   Calcium Carb-Ergocalciferol 500-200 MG-UNIT TABS Take 1 tablet by mouth daily.      clonazePAM (KLONOPIN) 0.5 MG tablet Take 0.5 mg by mouth 2 (two) times daily as needed for anxiety.      ibuprofen (ADVIL) 200 MG tablet Take 600 mg by mouth daily as needed (knee pain).     levothyroxine (SYNTHROID, LEVOTHROID) 75 MCG tablet Take 1 tablet (75 mcg total) by mouth daily before breakfast. 90 tablet 3   Magnesium 500 MG TABS Take 1 tablet by mouth daily.     metoprolol tartrate (LOPRESSOR) 50 MG tablet Take one tablet by mouth as directed prior to CT scans on 8/7 1 tablet 0   NON FORMULARY Take 1 tablet by mouth daily. Vein Supplement - Bioflavonoid     Omega-3 Fatty Acids (FISH OIL PO) Take 2 tablets by mouth 2 (two) times daily.      polyethylene glycol (MIRALAX / GLYCOLAX) 17 g packet Take 17 g by mouth daily.     pravastatin (PRAVACHOL) 40 MG tablet Take 40 mg by mouth daily.     Vitamin D, Cholecalciferol, 50 MCG (2000 UT) CAPS Take 2,000 Units by mouth 2 (two) times a day.     No current facility-administered medications for this visit.     No Known Allergies    Review of Systems:   General:  normal appetite, + decreased energy, no weight gain, no weight loss, no fever  Cardiac:  no chest pain with exertion, no chest pain at rest, +SOB with  exertion, no resting SOB, no PND, no orthopnea, + palpitations, no arrhythmia, no atrial fibrillation, no LE edema, + dizzy spells, no syncope  Respiratory:  + exertional shortness of breath, no home oxygen, no productive cough, no dry cough, no bronchitis, no wheezing, no hemoptysis,  no asthma, no pain with inspiration or cough, no sleep apnea, no CPAP at night  GI:   no difficulty swallowing, no reflux, no frequent heartburn, no hiatal  hernia, no abdominal pain, no constipation, no diarrhea, no hematochezia, no hematemesis, no melena  GU:   no dysuria,  no frequency, no urinary tract infection, no hematuria, no kidney stones, no kidney disease  Vascular:  no pain suggestive of claudication, no pain in feet, + leg cramps, no varicose veins, no DVT, no non-healing foot ulcer  Neuro:   no stroke, no TIA's, no seizures, no headaches, no temporary blindness one eye,  no slurred speech, no peripheral neuropathy, no chronic pain, no instability of gait, no memory/cognitive dysfunction  Musculoskeletal: + arthritis, no joint swelling, no myalgias, no difficulty walking, normal mobility   Skin:   no rash, no itching, no skin infections, no pressure sores or ulcerations  Psych:   + anxiety, + depression, + nervousness, + unusual recent stress  Eyes:   no blurry vision, no floaters, no recent vision changes, + wears glasses or contacts  ENT:   no hearing loss, no loose or painful teeth, no dentures, last saw dentist Jan 2020  Hematologic:  + easy bruising, no abnormal bleeding, no clotting disorder, no frequent epistaxis  Endocrine:  no diabetes, does not check CBG's at home           Physical Exam:   BP (!) 156/84 (BP Location: Right Arm, Patient Position: Sitting, Cuff Size: Large)    Pulse 81    Temp 97.7 F (36.5 C)    Resp 16    Ht 4\' 11"  (1.499 m)    Wt 225 lb (102.1 kg)    SpO2 98% Comment: RA   BMI 45.44 kg/m   General:  Obese woman, BMI 45, well-appearing  HEENT:  Unremarkable, NCAT, PERLA, EOMI  Neck:   no JVD, no bruits, no adenopathy   Chest:   clear to auscultation, symmetrical breath sounds, no wheezes, no rhonchi   CV:   RRR, grade III/VI crescendo/decrescendo murmur heard best at RSB,  no diastolic murmur  Abdomen:  soft, non-tender, no masses    Extremities:  warm, well-perfused, pulses palpable at ankle bilat, no LE edema  Rectal/GU  Deferred  Neuro:   Grossly non-focal and symmetrical throughout  Skin:   Clean and dry, no rashes, no breakdown   Diagnostic Tests:    Patient Name:   VALLEY KE Date of Exam: 01/15/2019 Medical Rec #:  [id number]     Height:       59.0 in Accession #:    [id number]    Weight:       223.0 lb Date of Birth:  Sep 11, 1945     BSA:          1.93 m Patient Age:    40 years      BP:           137/77 mmHg Patient Gender: F             HR:           82 bpm. Exam Location:  [place]    Procedure: 2D Echo  Indications:    Aortic Stenosis   History:        Patient has prior history of Echocardiogram examinations, most                 recent 05/13/2018. LVH Aortic Valve Disease Risk Factors:                 Dyslipidemia.   Sonographer:    Cardell Peach RDCS (AE) Referring Phys: [place]  IMPRESSIONS    1. The left ventricle has normal systolic function with an ejection fraction of 60-65%. The cavity size was normal. There is moderately increased left ventricular wall thickness. Left ventricular diastolic Doppler parameters are consistent with impaired  relaxation.  2. The right ventricle has normal systolic function. The cavity was normal. There is no increase in right ventricular wall thickness.  3. Left atrial size was mild-moderately dilated.  4. The tricuspid valve is grossly normal.  5. The aortic valve is tricuspid. Moderate calcification of the aortic valve. Aortic valve regurgitation is mild by color flow Doppler. Severe stenosis of the aortic valve.  FINDINGS  Left Ventricle: The left ventricle has normal systolic function, with an ejection fraction of 60-65%. The cavity size was normal. There is moderately increased left ventricular wall thickness. Left ventricular diastolic Doppler parameters are consistent  with impaired relaxation.  Right Ventricle: The right  ventricle has normal systolic function. The cavity was normal. There is no increase in right ventricular wall thickness.  Left Atrium: Left atrial size was mild-moderately dilated.  Right Atrium: Right atrial size was normal in size. Right atrial pressure is estimated at 10 mmHg.  Interatrial Septum: No atrial level shunt detected by color flow Doppler.  Pericardium: There is no evidence of pericardial effusion.  Mitral Valve: The mitral valve is normal in structure. Mitral valve regurgitation was not assessed by color flow Doppler.  Tricuspid Valve: The tricuspid valve is grossly normal. Tricuspid valve regurgitation is mild by color flow Doppler.  Aortic Valve: The aortic valve is tricuspid Moderate calcification of the aortic valve. Aortic valve regurgitation is mild by color flow Doppler. There is Severe stenosis of the aortic valve, with a calculated valve area of 0.73 cm.  Pulmonic Valve: The pulmonic valve was not well visualized. Pulmonic valve regurgitation was not assessed by color flow Doppler.  Venous: The inferior vena cava measures 1.60 cm, is normal in size with greater than 50% respiratory variability.    +--------------+--------++  LEFT VENTRICLE            +----------------+---------++ +--------------+--------++  Diastology                    PLAX 2D                   +----------------+---------++ +--------------+--------++  LV e' lateral:   9.68 cm/s    LVIDd:         4.80 cm    +----------------+---------++ +--------------+--------++  LV E/e' lateral: 9.3          LVIDs:         3.10 cm    +----------------+---------++ +--------------+--------++  LV e' medial:    7.72 cm/s    LV PW:         1.30 cm    +----------------+---------++ +--------------+--------++  LV E/e' medial:  11.6         LV IVS:        1.60 cm    +----------------+---------++ +--------------+--------++  LVOT diam:     1.60 cm    +--------------+--------++  LV SV:         70 ml       +--------------+--------++  LV SV Index:   32.88      +--------------+--------++  LVOT Area:     2.01 cm   +--------------+--------++                            +--------------+--------++  +---------------+---------++  RIGHT VENTRICLE             +---------------+---------++  RV Basal diam:  3.60 cm     +---------------+---------++  RV S prime:     9.46 cm/s   +---------------+---------++  TAPSE (M-mode): 1.9 cm      +---------------+---------++  +---------------+-------++-----------++  LEFT ATRIUM              Index         +---------------+-------++-----------++  LA diam:        3.80 cm  1.97 cm/m    +---------------+-------++-----------++  LA Vol (A2C):   47.7 ml  24.69 ml/m   +---------------+-------++-----------++  LA Vol (A4C):   53.9 ml  27.90 ml/m   +---------------+-------++-----------++  LA Biplane Vol: 51.9 ml  26.87 ml/m   +---------------+-------++-----------++ +------------+---------++-----------++  RIGHT ATRIUM            Index         +------------+---------++-----------++  RA Area:     15.00 cm                +------------+---------++-----------++  RA Volume:   36.30 ml   18.79 ml/m   +------------+---------++-----------++  +------------------+------------++  AORTIC VALVE                      +------------------+------------++  AV Area (Vmax):    0.84 cm       +------------------+------------++  AV Area (Vmean):   0.69 cm       +------------------+------------++  AV Area (VTI):     0.73 cm       +------------------+------------++  AV Vmax:           391.60 cm/s    +------------------+------------++  AV Vmean:          295.400 cm/s   +------------------+------------++  AV VTI:            0.992 m        +------------------+------------++  AV Peak Grad:      61.3 mmHg      +------------------+------------++  AV Mean Grad:      39.0 mmHg      +------------------+------------++  LVOT Vmax:         164.00 cm/s     +------------------+------------++  LVOT Vmean:        101.000 cm/s   +------------------+------------++  LVOT VTI:          0.358 m        +------------------+------------++  LVOT/AV VTI ratio: 0.36           +------------------+------------++  AR PHT:            389 msec       +------------------+------------++   +-------------+-------++  AORTA                   +-------------+-------++  Ao Root diam: 2.70 cm   +-------------+-------++  Ao Asc diam:  3.60 cm   +-------------+-------++  +--------------+----------++ +---------------+-----------++  MITRAL VALVE                 TRICUSPID VALVE               +--------------+----------++ +---------------+-----------++  MV Area (PHT): 2.99 cm      TR Peak grad:   24.2 mmHg     +--------------+----------++ +---------------+-----------++  MV PHT:        73.66 msec    TR Vmax:        246.00 cm/s   +--------------+----------++ +---------------+-----------++  MV Decel Time: 254 msec     +--------------+----------++ +--------------+-------+ +--------------+----------++  SHUNTS                   MV E velocity: 89.70 cm/s   +--------------+-------+ +--------------+----------++  Systemic VTI:  0.36 m    MV A velocity: 88.10 cm/s   +--------------+-------+ +--------------+----------++  Systemic Diam: 1.60 cm   MV E/A ratio:  1.02         +--------------+-------+ +--------------+----------++  +---------+-------+  IVC                +---------+-------+  IVC diam: 1.60 cm  +---------+-------+    Jyl Heinz MD Electronically signed by Jyl Heinz MD Signature Date/Time: 01/15/2019/4:44:10 PM    Physicians  Panel Physicians Referring Physician Case Authorizing Physician  Sherren Mocha, MD (Primary)    Procedures  LEFT HEART CATH AND CORONARY ANGIOGRAPHY  Conclusion  1.  Mild diffuse calcific coronary artery disease, primarily affecting the LAD and RCA territories 2.  Severe aortic stenosis with peak instantaneous  gradient 51 mmHg, peak to peak gradient 44 mmHg, mean gradient 33 mmHg  Recommendations: Multidisciplinary heart valve team evaluation for treatment of severe symptomatic aortic stenosis.  Medical therapy for nonobstructive coronary artery disease.  Indications  Severe aortic stenosis [I35.0 (ICD-10-CM)]  Procedural Details  Technical Details INDICATION: Severe symptomatic aortic stenosis  PROCEDURAL DETAILS: There was an indwelling IV in a right antecubital vein.  I attempted to change out the IV for a 4/5 French slender sheath.  The wire passed into the vein but coursed medially and I was unable to to direct the wire into an adequate vein.  Multiple attempts were made.  Because the patient's had an echocardiogram with no evidence of pulmonary hypertension or RV dysfunction, I felt like the right heart catheterization could be aborted.  Attention was then turned to the left heart catheterization.  The right wrist was then prepped, draped, and anesthetized with 1% lidocaine. Using the modified Seldinger technique a 5/6 French Slender sheath was placed in the right radial artery. Intra-arterial verapamil was administered through the radial artery sheath. IV heparin was administered after a JR4 catheter was advanced into the central aorta. Standard protocol was followed for recording of right heart pressures and sampling of oxygen saturations. Fick cardiac output was calculated. Standard Judkins catheters were used for selective coronary angiography. LV pressure is recorded and an aortic valve pullback is performed. There were no immediate procedural complications. The patient was transferred to the post catheterization recovery area for further monitoring.    Estimated blood loss <50 mL.   During this procedure medications were administered to achieve and maintain moderate conscious sedation while the patient's heart rate, blood pressure, and oxygen saturation were continuously monitored and I was  present face-to-face 100% of this time.  Medications (Filter: Administrations occurring from 02/13/19 0831 to 02/13/19 0929) (important)  Continuous medications are totaled by the amount administered until 02/13/19 0929.  Medication Rate/Dose/Volume Action  Date Time   midazolam (VERSED) injection (mg) 2 mg Given 02/13/19 0850   Total dose as of 02/13/19 0929        2 mg        fentaNYL (SUBLIMAZE) injection (mcg) 25 mcg Given 02/13/19 0850   Total dose as of 02/13/19 0929        25 mcg        Heparin (Porcine) in NaCl 1000-0.9 UT/500ML-% SOLN (mL) 500 mL Given 02/13/19 0850   Total dose as of  02/13/19 0929 500 mL Given 0850   1,000 mL        lidocaine (PF) (XYLOCAINE) 1 % injection (mL) 2 mL Given 02/13/19 0859   Total dose as of 02/13/19 0929 2 mL Given 0901   4 mL        Radial Cocktail/Verapamil only (mL) 10 mL Given 02/13/19 0902   Total dose as of 02/13/19 0929        10 mL        heparin injection (Units) 5,000 Units Given 02/13/19 0908   Total dose as of 02/13/19 0929        5,000 Units        iohexol (OMNIPAQUE) 350 MG/ML injection (mL) 60 mL Given 02/13/19 0920   Total dose as of 02/13/19 0929        60 mL        Sedation Time  Sedation Time Physician-1: 29 minutes 24 seconds  Coronary Findings  Diagnostic Dominance: Right Left Main  Vessel is moderate in size. There is mild diffuse disease throughout the vessel.  Left Anterior Descending  Prox LAD to Mid LAD lesion 40% stenosed  Prox LAD to Mid LAD lesion is 40% stenosed. The lesion is calcified. There is mild diffuse calcific plaquing in the proximal LAD without high-grade obstruction.  Ramus Intermedius  There is mild diffuse disease throughout the vessel.  Left Circumflex  Vessel is normal in caliber. The vessel exhibits minimal luminal irregularities. The circumflex supplies to tortuous obtuse marginal branches with no obstruction  Right Coronary Artery  Prox RCA lesion 40% stenosed  Prox RCA lesion is 40%  stenosed. The lesion is moderately calcified.  Intervention  No interventions have been documented. Left Heart  Aortic Valve There is severe aortic valve stenosis. The aortic valve is calcified. There is restricted aortic valve motion. There is moderate aortic valve calcification and restriction.  The peak instantaneous gradient is 51 mmHg, Mean gradient 33 mmHg, and peak to peak gradient 44 mmHg.  Coronary Diagrams  Diagnostic Dominance: Right  Intervention  Implants   No implant documentation for this case.  Syngo Images  Show images for CARDIAC CATHETERIZATION  Images on Long Term Storage  Show images for Lexa, [place] to Procedure Log  Procedure Log    Hemo Data   Most Recent Value  Aortic Mean Gradient 32.86 mmHg  Aortic Peak Gradient 44 mmHg  AO Systolic Pressure 250 mmHg  AO Diastolic Pressure 72 mmHg  AO Mean 539 mmHg  LV Systolic Pressure 767 mmHg  LV Diastolic Pressure 5 mmHg  LV EDP 13 mmHg  AOp Systolic Pressure 341 mmHg  AOp Diastolic Pressure 76 mmHg  AOp Mean Pressure 937 mmHg  LVp Systolic Pressure 902 mmHg  LVp Diastolic Pressure 4 mmHg  LVp EDP Pressure 13 mmHg    ADDENDUM REPORT: 02/20/2019 14:15  ADDENDUM: Reprocessed phases reviewed and measurements pertinent to valve sizing below  Min x Max diameter 23.8 x 20.3 mm  Area 375 mm2  Perimeter 70 mm  LM height 10.6 mm  RCA height 9.5 mm  LCS: 23.5 mm  RCS: 22.5 mm  RCS: 24 mm  Suggests that 23 mm Sapien 3 correct size   Electronically Signed   By: Jenkins Rouge M.D.   On: 02/20/2019 14:15   Addended by Josue Hector, MD on 02/20/2019 2:17 PM    ADDENDUM REPORT: 02/20/2019 14:02  CLINICAL DATA:  Aortic stenosis  EXAM: Cardiac TAVR CT  TECHNIQUE:  The patient was scanned on a Siemens Force 025 slice scanner. A 120 kV retrospective scan was triggered in the descending thoracic aorta at 111 HU's. Gantry rotation speed was 270 msecs and  collimation was .9 mm. No beta blockade or nitro were given. The 3D data set was reconstructed in 5% intervals of the R-R cycle. Systolic and diastolic phases were analyzed on a dedicated work station using MPR, MIP and VRT modes. The patient received 80 cc of contrast.  FINDINGS: Aortic Valve: The images were extremely noisy Scan may need to be repeated having tech reprocess Tri leaflet valve with moderate calcification particularly the non coronary cusp  Aorta: No aneurysm  Sinotubular Junction: 23 mm  Ascending Thoracic Aorta: 33 mm  Aortic Arch: 24 mm  Descending Thoracic Aorta: 23 mm  Sinus of Valsalva Measurements:  Non-coronary: 24 mm  Right - coronary: 22 mm  Left - coronary: 23 mm  Coronary Artery Height above Annulus:  Left Main: 10.7 mm above annulus  Right Coronary: 10.5 mm above annulus  Virtual Basal Annulus Measurements:  Maximum/Minimum Diameter: 23.5 mm x 20.9 mm  Perimeter: 69 mm  Area: 348-378 mm2  Coronary Arteries: Sufficient height above annulus for deployment  Optimum Fluoroscopic Angle for Delivery: LAO 7 Caudal 6 degrees  IMPRESSION: 1. Sub-optimal scan with lot of noise. Study to be reprocessed may need to be re-scanned  2. Tri leaflet AV with annular area of 348-378 mm2 suitable for 23 Sapien 3 but sinuses appear small  3.  Coronary arteries suitable height above annulus for deployment  4.  Optimum angiographic angle for deployment LAO 7 Caudal 6 degrees  5.  Normal aortic root 3.3 cm  Jenkins Rouge   Electronically Signed   By: Jenkins Rouge M.D.   On: 02/20/2019 14:02     Impression:  This 73 year old woman has stage D, severe, symptomatic aortic stenosis with New York Heart Association class II symptoms of exertional fatigue and shortness of breath consistent with chronic diastolic congestive heart failure.  I have personally reviewed her 2D echocardiogram, cardiac catheterization, and CTA  studies.  Echocardiogram shows a trileaflet aortic valve with moderate calcification and restricted mobility.  The mean gradient is 39 mmHg and the valve area is 0.8 cm consistent with severe aortic stenosis.  Cardiac catheterization shows mild nonobstructive coronary disease with normal left ventricular end-diastolic pressure.  The mean gradient was 33 mmHg with a peak to peak gradient of 44 mmHg.  I agree that aortic valve replacement is indicated in this patient who has developed severe aortic stenosis and is becoming symptomatic.  We reviewed her CTA studies with the multidisciplinary heart valve team.  She has a left main height of 10.6 mm and the RCA height of 9.5 mm which are borderline for TAVR.  In addition she has relatively small sinuses and a 22 to 24 mm range.  It is felt that she would be at increased risk for coronary occlusion from the native valve leaflets and that open surgery would probably be the best option for her.  I reviewed all of her studies with her and her husband and the reasons that we did not feel that TAVR was the best option for her.  She seems to understand that and is in agreement with open surgical aortic valve replacement. I discussed the operative procedure with the patient and her husband including alternatives, benefits and risks; including but not limited to bleeding, blood transfusion, infection, stroke, myocardial infarction, graft failure, heart block  requiring a permanent pacemaker, organ dysfunction, and death.  Jerel Shepherd understands and agrees to proceed.  She will call us to schedule surgery.  Plan:  The patient will call us to schedule open surgical aortic valve replacement using a bioprosthetic valve.   I spent 60 minutes performing this consultation and > 50% of this time was spent face to face counseling and coordinating the care of this patient's severe symptomatic aortic stenosis.   Gaye Pollack, MD 03/04/2019

## 2019-03-04 NOTE — Telephone Encounter (Signed)
Patient would not say what she wanted other than she wanted to speak to the nurse.

## 2019-03-05 ENCOUNTER — Encounter: Payer: Self-pay | Admitting: *Deleted

## 2019-03-05 ENCOUNTER — Other Ambulatory Visit: Payer: Self-pay | Admitting: *Deleted

## 2019-03-05 DIAGNOSIS — I350 Nonrheumatic aortic (valve) stenosis: Secondary | ICD-10-CM

## 2019-03-05 NOTE — Telephone Encounter (Signed)
Called patient who explained that she did not qualify for the TAVR procedure and she is scheduled for open heart surgery next Thursday, 03/12/2019, with Dr. Cyndia Bent and Dr. Roxy Manns. Patient is very nervous and just "needs some reassurance from Dr. Bettina Gavia." Will speak with Dr. Bettina Gavia to see if he is willing to call patient. Patient is agreeable and verbalized understanding. No further questions.

## 2019-03-06 NOTE — Telephone Encounter (Signed)
Dr. Bettina Gavia attempted to call patient with no answer yesterday. He will try to call her again today to discuss concerns.

## 2019-03-09 NOTE — Pre-Procedure Instructions (Signed)
KOHANA RONGEY  03/09/2019      [place], [place] [address] Phone: [phone] Fax: [phone]    Your procedure is scheduled on March 12, 2019.  Report to [facility] Admitting at 530 AM.  Call this number if you have problems the morning of surgery:  [phone]  Call [phone] if you have any questions prior to your surgery date Monday-Friday 8am-4pm    Remember:  Do not eat or drink after midnight.    Take these medicines the morning of surgery with A SIP OF WATER Bupropion (Wellbutrin XL) Levothyroxine (Synthroid) Clonazepam (Klonopin)   Follow your surgeon's instructions on when to hold/resume aspirin.  If no instructions were given call the office to determine how they would like to you take aspirin  7 days prior to surgery STOP taking any  Aleve, Naproxen, Ibuprofen, Motrin, Advil, Goody's, BC's, all herbal medications, fish oil, and all vitamins.    Day of surgery  Do not wear jewelry, make-up or nail polish.  Do not wear lotions, powders, or perfumes, or deodorant.  Do not shave 48 hours prior to surgery.   Do not bring valuables to the hospital.  [facility] is not responsible for any belongings or valuables.  Contacts, dentures or bridgework may not be worn into surgery.  Leave your suitcase in the car.  After surgery it may be brought to your room.  For patients admitted to the hospital, discharge time will be determined by your treatment team.  Patients discharged the day of surgery will not be allowed to drive home.   [facility]- Preparing For Surgery  Before surgery, you can play an important role. Because skin is not sterile, your skin needs to be as free of germs as possible. You can reduce the number of germs on your skin by washing with CHG (chlorahexidine gluconate) Soap before surgery.  CHG is an antiseptic cleaner which kills germs and bonds with the skin to continue  killing germs even after washing.    Oral Hygiene is also important to reduce your risk of infection.  Remember - BRUSH YOUR TEETH THE MORNING OF SURGERY WITH YOUR REGULAR TOOTHPASTE  Please do not use if you have an allergy to CHG or antibacterial soaps. If your skin becomes reddened/irritated stop using the CHG.  Do not shave (including legs and underarms) for at least 48 hours prior to first CHG shower. It is OK to shave your face.  Please follow these instructions carefully.   1. Shower the NIGHT BEFORE SURGERY and the MORNING OF SURGERY with CHG.   2. If you chose to wash your hair, wash your hair first as usual with your normal shampoo.  3. After you shampoo, rinse your hair and body thoroughly to remove the shampoo.  4. Use CHG as you would any other liquid soap. You can apply CHG directly to the skin and wash gently with a scrungie or a clean washcloth.   5. Apply the CHG Soap to your body ONLY FROM THE NECK DOWN.  Do not use on open wounds or open sores. Avoid contact with your eyes, ears, mouth and genitals (private parts). Wash Face and genitals (private parts)  with your normal soap.  6. Wash thoroughly, paying special attention to the area where your surgery will be performed.  7. Thoroughly rinse your body with warm water from the neck down.  8. DO NOT shower/wash  with your normal soap after using and rinsing off the CHG Soap.  9. Pat yourself dry with a CLEAN TOWEL.  10. Wear CLEAN PAJAMAS to bed the night before surgery, wear comfortable clothes the morning of surgery  11. Place CLEAN SHEETS on your bed the night of your first shower and DO NOT SLEEP WITH PETS.   Day of Surgery: Shower as above Do not apply any deodorants/lotions.  Please wear clean clothes to the hospital/surgery center.   Remember to brush your teeth WITH YOUR REGULAR TOOTHPASTE.   Please read over the following fact sheets that you were given.

## 2019-03-10 ENCOUNTER — Ambulatory Visit (HOSPITAL_BASED_OUTPATIENT_CLINIC_OR_DEPARTMENT_OTHER)
Admission: RE | Admit: 2019-03-10 | Discharge: 2019-03-10 | Disposition: A | Discharge status: Home / Self Care | Payer: Medicare Other | Source: Ambulatory Visit | Attending: Surgery | Admitting: Surgery

## 2019-03-10 ENCOUNTER — Ambulatory Visit (HOSPITAL_COMMUNITY)
Admission: RE | Admit: 2019-03-10 | Discharge: 2019-03-10 | Disposition: A | Discharge status: Home / Self Care | Payer: Medicare Other | Source: Ambulatory Visit | Attending: Surgery | Admitting: Surgery

## 2019-03-10 ENCOUNTER — Other Ambulatory Visit (HOSPITAL_COMMUNITY)
Admission: RE | Admit: 2019-03-10 | Discharge: 2019-03-10 | Disposition: A | Discharge status: Home / Self Care | Payer: Medicare Other | Source: Ambulatory Visit | Attending: Surgery | Admitting: Surgery

## 2019-03-10 ENCOUNTER — Encounter (HOSPITAL_COMMUNITY): Payer: Self-pay

## 2019-03-10 ENCOUNTER — Encounter (HOSPITAL_COMMUNITY)
Admission: RE | Admit: 2019-03-10 | Discharge: 2019-03-10 | Disposition: A | Discharge status: Home / Self Care | Payer: Medicare Other | Source: Ambulatory Visit | Attending: Surgery | Admitting: Surgery

## 2019-03-10 ENCOUNTER — Other Ambulatory Visit: Payer: Self-pay

## 2019-03-10 DIAGNOSIS — Z20828 Contact with and (suspected) exposure to other viral communicable diseases: Secondary | ICD-10-CM | POA: Insufficient documentation

## 2019-03-10 DIAGNOSIS — I350 Nonrheumatic aortic (valve) stenosis: Secondary | ICD-10-CM

## 2019-03-10 DIAGNOSIS — Z01818 Encounter for other preprocedural examination: Secondary | ICD-10-CM | POA: Insufficient documentation

## 2019-03-10 HISTORY — DX: Anxiety disorder, unspecified: F41.9

## 2019-03-10 HISTORY — DX: Palpitations: R00.2

## 2019-03-10 HISTORY — DX: Unspecified osteoarthritis, unspecified site: M19.90

## 2019-03-10 HISTORY — DX: Disorder of thyroid, unspecified: E07.9

## 2019-03-10 HISTORY — DX: Cardiac murmur, unspecified: R01.1

## 2019-03-10 LAB — CBC
HCT: 40.7 % (ref 36.0–46.0)
Hemoglobin: 13.5 g/dL (ref 12.0–15.0)
MCH: 28.4 pg (ref 26.0–34.0)
MCHC: 33.2 g/dL (ref 30.0–36.0)
MCV: 85.5 fL (ref 80.0–100.0)
Platelets: 317 10*3/uL (ref 150–400)
RBC: 4.76 MIL/uL (ref 3.87–5.11)
RDW: 13.7 % (ref 11.5–15.5)
WBC: 6.3 10*3/uL (ref 4.0–10.5)
nRBC: 0 % (ref 0.0–0.2)

## 2019-03-10 LAB — TYPE AND SCREEN
ABO/RH(D): A NEG
Antibody Screen: NEGATIVE

## 2019-03-10 LAB — HEMOGLOBIN A1C
Hgb A1c MFr Bld: 5.5 % (ref 4.8–5.6)
Mean Plasma Glucose: 111.15 mg/dL

## 2019-03-10 LAB — URINALYSIS, ROUTINE W REFLEX MICROSCOPIC
Bilirubin Urine: NEGATIVE
Glucose, UA: NEGATIVE mg/dL
Hgb urine dipstick: NEGATIVE
Ketones, ur: NEGATIVE mg/dL
Leukocytes,Ua: NEGATIVE
Nitrite: NEGATIVE
Protein, ur: NEGATIVE mg/dL
Specific Gravity, Urine: 1.008 (ref 1.005–1.030)
pH: 8 (ref 5.0–8.0)

## 2019-03-10 LAB — COMPREHENSIVE METABOLIC PANEL
ALT: 31 U/L (ref 0–44)
AST: 22 U/L (ref 15–41)
Albumin: 4 g/dL (ref 3.5–5.0)
Alkaline Phosphatase: 54 U/L (ref 38–126)
Anion gap: 9 (ref 5–15)
BUN: 14 mg/dL (ref 8–23)
CO2: 24 mmol/L (ref 22–32)
Calcium: 9.6 mg/dL (ref 8.9–10.3)
Chloride: 107 mmol/L (ref 98–111)
Creatinine, Ser: 1.1 mg/dL — ABNORMAL HIGH (ref 0.44–1.00)
GFR calc Af Amer: 58 mL/min — ABNORMAL LOW (ref >60)
GFR calc non Af Amer: 50 mL/min — ABNORMAL LOW (ref >60)
Glucose, Bld: 103 mg/dL — ABNORMAL HIGH (ref 70–99)
Potassium: 4 mmol/L (ref 3.5–5.1)
Sodium: 140 mmol/L (ref 135–145)
Total Bilirubin: 0.6 mg/dL (ref 0.3–1.2)
Total Protein: 6.9 g/dL (ref 6.5–8.1)

## 2019-03-10 LAB — ABO/RH: ABO/RH(D): A NEG

## 2019-03-10 LAB — PROTIME-INR
INR: 0.9 (ref 0.8–1.2)
Prothrombin Time: 12.3 seconds (ref 11.4–15.2)

## 2019-03-10 LAB — SURGICAL PCR SCREEN
MRSA, PCR: NEGATIVE
Staphylococcus aureus: POSITIVE — AB

## 2019-03-10 LAB — APTT: aPTT: 30 seconds (ref 24–36)

## 2019-03-10 LAB — SARS CORONAVIRUS 2 (TAT 6-24 HRS): SARS Coronavirus 2: NEGATIVE

## 2019-03-10 NOTE — Progress Notes (Signed)
Surgical PCR + staph, prescription called into [facility] in [place].  Pt notified, will start mupirocin ointment today.

## 2019-03-10 NOTE — Progress Notes (Signed)
Pre Op vascular       has been completed. Preliminary results can be found under CV proc through chart review. June Leap, BS, RDMS, RVT

## 2019-03-10 NOTE — Progress Notes (Signed)
PCP: Dr. Lovette Cliche Cardiologist: Dr. Bettina Gavia  EKG: Today CXR: Today ECHO: 01/15/2019 Stress Test: denies Cardiac Cath: 02/13/2019 Dopplers: Today after PAT visit   Patient denies shortness of breath, fever, cough, and chest pain at PAT appointment.  Patient verbalized understanding of instructions provided today at the PAT appointment.  Patient asked to review instructions at home and day of surgery.

## 2019-03-11 MED ORDER — POTASSIUM CHLORIDE 2 MEQ/ML IV SOLN
80.0000 meq | INTRAVENOUS | Status: DC
  Filled 2019-03-11: qty 40

## 2019-03-11 MED ORDER — TRANEXAMIC ACID 1000 MG/10ML IV SOLN
1.5000 mg/kg/h | INTRAVENOUS | Status: AC
  Administered 2019-03-12: 1.5 mg/kg/h via INTRAVENOUS
  Filled 2019-03-11: qty 25

## 2019-03-11 MED ORDER — VANCOMYCIN HCL 10 G IV SOLR
1500.0000 mg | INTRAVENOUS | Status: AC
  Administered 2019-03-12: 1500 mg via INTRAVENOUS
  Filled 2019-03-11: qty 1500

## 2019-03-11 MED ORDER — DEXMEDETOMIDINE HCL IN NACL 400 MCG/100ML IV SOLN
0.1000 ug/kg/h | INTRAVENOUS | Status: AC
  Administered 2019-03-12: 09:00:00 0.7 ug/kg/h via INTRAVENOUS
  Filled 2019-03-11: qty 100

## 2019-03-11 MED ORDER — INSULIN REGULAR(HUMAN) IN NACL 100-0.9 UT/100ML-% IV SOLN
INTRAVENOUS | Status: DC
  Filled 2019-03-11: qty 100

## 2019-03-11 MED ORDER — DOPAMINE-DEXTROSE 3.2-5 MG/ML-% IV SOLN
0.0000 ug/kg/min | INTRAVENOUS | Status: DC
  Filled 2019-03-11: qty 250

## 2019-03-11 MED ORDER — MAGNESIUM SULFATE 50 % IJ SOLN
40.0000 meq | INTRAMUSCULAR | Status: DC
  Filled 2019-03-11: qty 9.85

## 2019-03-11 MED ORDER — TRANEXAMIC ACID (OHS) BOLUS VIA INFUSION
15.0000 mg/kg | INTRAVENOUS | Status: AC
  Administered 2019-03-12: 08:00:00 1513.5 mg via INTRAVENOUS
  Filled 2019-03-11: qty 1514

## 2019-03-11 MED ORDER — TRANEXAMIC ACID (OHS) PUMP PRIME SOLUTION
2.0000 mg/kg | INTRAVENOUS | Status: DC
  Filled 2019-03-11: qty 2.02

## 2019-03-11 MED ORDER — NITROGLYCERIN IN D5W 200-5 MCG/ML-% IV SOLN
2.0000 ug/min | INTRAVENOUS | Status: AC
  Administered 2019-03-12: 07:00:00 10 ug/min via INTRAVENOUS
  Filled 2019-03-11: qty 250

## 2019-03-11 MED ORDER — SODIUM CHLORIDE 0.9 % IV SOLN
INTRAVENOUS | Status: DC
  Filled 2019-03-11: qty 30

## 2019-03-11 MED ORDER — NOREPINEPHRINE 4 MG/250ML-% IV SOLN
0.0000 ug/min | INTRAVENOUS | Status: DC
  Filled 2019-03-11: qty 250

## 2019-03-11 MED ORDER — PHENYLEPHRINE HCL-NACL 20-0.9 MG/250ML-% IV SOLN
30.0000 ug/min | INTRAVENOUS | Status: DC
  Filled 2019-03-11: qty 250

## 2019-03-11 MED ORDER — PLASMA-LYTE 148 IV SOLN
INTRAVENOUS | Status: AC
  Administered 2019-03-12: 09:00:00 500 mL
  Filled 2019-03-11: qty 2.5

## 2019-03-11 MED ORDER — SODIUM CHLORIDE 0.9 % IV SOLN
750.0000 mg | INTRAVENOUS | Status: DC
  Filled 2019-03-11: qty 750

## 2019-03-11 MED ORDER — MILRINONE LACTATE IN DEXTROSE 20-5 MG/100ML-% IV SOLN
0.3000 ug/kg/min | INTRAVENOUS | Status: DC
  Filled 2019-03-11: qty 100

## 2019-03-11 MED ORDER — EPINEPHRINE HCL 5 MG/250ML IV SOLN IN NS
0.0000 ug/min | INTRAVENOUS | Status: DC
  Filled 2019-03-11: qty 250

## 2019-03-11 MED ORDER — SODIUM CHLORIDE 0.9 % IV SOLN
1.5000 g | INTRAVENOUS | Status: AC
  Administered 2019-03-12: 1.5 g via INTRAVENOUS
  Filled 2019-03-11: qty 1.5

## 2019-03-11 NOTE — Anesthesia Preprocedure Evaluation (Addendum)
Anesthesia Evaluation  Patient identified by MRN, date of birth, ID band Patient awake    Reviewed: Allergy & Precautions, NPO status , Patient's Chart, lab work & pertinent test results  Airway Mallampati: III  TM Distance: >3 FB Neck ROM: Full    Dental  (+) Teeth Intact, Dental Advisory Given, Implants,    Pulmonary former smoker,    Pulmonary exam normal breath sounds clear to auscultation       Cardiovascular hypertension, Pt. on home beta blockers + Valvular Problems/Murmurs AS and MR  Rhythm:Regular Rate:Normal + Systolic murmurs Echo 99991111:  1. The left ventricle has normal systolic function with an ejection fraction of 60-65%. The cavity size was normal. There is moderately increased left ventricular wall thickness. Left ventricular diastolic Doppler parameters are consistent with impaired  relaxation.  2. The right ventricle has normal systolic function. The cavity was normal. There is no increase in right ventricular wall thickness.  3. Left atrial size was mild-moderately dilated.  4. The tricuspid valve is grossly normal.  5. The aortic valve is tricuspid. Moderate calcification of the aortic valve. Aortic valve regurgitation is mild by color flow Doppler. Severe stenosis of the aortic valve.   Neuro/Psych PSYCHIATRIC DISORDERS Anxiety Depression negative neurological ROS     GI/Hepatic negative GI ROS, Neg liver ROS,   Endo/Other  Hypothyroidism Morbid obesity  Renal/GU negative Renal ROS     Musculoskeletal  (+) Arthritis ,   Abdominal   Peds  Hematology negative hematology ROS (+)   Anesthesia Other Findings Day of surgery medications reviewed with the patient.  Reproductive/Obstetrics                           Anesthesia Physical Anesthesia Plan  ASA: IV  Anesthesia Plan: General   Post-op Pain Management:    Induction: Intravenous  PONV Risk Score and Plan: 3 and  Midazolam and Treatment may vary due to age or medical condition  Airway Management Planned: Oral ETT  Additional Equipment: Arterial line, CVP, PA Cath, TEE and Ultrasound Guidance Line Placement  Intra-op Plan:   Post-operative Plan: Post-operative intubation/ventilation  Informed Consent: I have reviewed the patients History and Physical, chart, labs and discussed the procedure including the risks, benefits and alternatives for the proposed anesthesia with the patient or authorized representative who has indicated his/her understanding and acceptance.     Dental advisory given  Plan Discussed with: CRNA  Anesthesia Plan Comments:        Anesthesia Quick Evaluation

## 2019-03-11 NOTE — H&P (Signed)
[address]       [place] [address]             [phone]      Cardiothoracic Surgery Admission History and Physical   Referring Provider is Richardo Priest, MD  Primary Cardiologist is No primary care provider on file.  PCP is Angelina Sheriff, MD      Chief Complaint  Patient presents with  . Aortic Stenosis      HPI:  The patient is a 73 year old woman with a history of hyperlipidemia, hypothyroidism, and aortic stenosis with aortic insufficiency who has been followed by Dr. Bettina Gavia with serial echocardiograms. August 2016 she had a gradient of 18 mmHg and then 03/2017 her gradient increased to 28 mmHg. Her most recent echocardiogram on 01/15/2019 showed an increase in the mean gradient to 39 mmHg with a peak gradient of 61 mmHg and an aortic valve area of 0.84 cm. Left ventricular systolic function was normal with ejection fraction of 60 to 65%. There is mild aortic insufficiency. Cardiac catheterization on 02/13/2019 showed mild nonobstructive coronary disease involving the proximal LAD and right coronary arteries. The mean gradient across aortic valve was 33 mmHg with a peak gradient of 44 mmHg.  The patient is still relatively active and goes to the [facility] doing aquatics. She has noticed development of progressive exertional fatigue and shortness of breath. She denies any dizziness or syncope. She has had no chest pain or pressure. She has had no orthopnea. She denies any peripheral edema.      Past Medical History:  Diagnosis Date  . Depression   . Diverticulitis   . High cholesterol   . Mitral valve regurgitation   . Osteopenia   . Papilloma of breast   . Severe aortic stenosis         Past Surgical History:  Procedure Laterality Date  . BIOPSY THYROID  JAN 2015  . BOWEL RESECTION    . CESAREAN SECTION    . CHOLECYSTECTOMY    . COLONOSCOPY  07/26/2014   Colonic polyps statuas post polypectomy. Mild neo diverticulosis. External hemorrhoids.    . COLOSTOMY TAKEDOWN    . FINGER SURGERY    . LEFT HEART CATH AND CORONARY ANGIOGRAPHY N/A 02/13/2019   Procedure: LEFT HEART CATH AND CORONARY ANGIOGRAPHY; Surgeon: Sherren Mocha, MD; Location: [facility]; Service: Cardiovascular; Laterality: N/A;  . TUBAL LIGATION          Family History  Problem Relation Age of Onset  . Lung cancer Mother   . Cancer Mother    lung-smoker  . Heart disease Father   . Cancer Maternal Grandmother    liver  . Breast cancer Maternal Aunt    age 33  . Cancer Maternal Aunt    lymphoma  . Cancer Maternal Uncle    lung   Social History        Socioeconomic History  . Marital status: Married    Spouse name: Not on file  . Number of children: Not on file  . Years of education: Not on file  . Highest education level: Not on file  Occupational History  . Not on file  Social Needs  . Financial resource strain: Not on file  . Food insecurity    Worry: Not on file    Inability: Not on file  . Transportation needs    Medical: Not on file    Non-medical: Not on file  Tobacco Use  . Smoking status: Former Smoker    Types: Cigarettes  . Smokeless tobacco: Never Used  Substance and Sexual Activity  . Alcohol use: No  . Drug use: No  . Sexual activity: Never    Birth control/protection: Surgical  Lifestyle  . Physical activity    Days per week: Not on file    Minutes per session: Not on file  . Stress: Not on file  Relationships  . Social Herbalist on phone: Not on file    Gets together: Not on file    Attends religious service: Not on file    Active member of club or organization: Not on file    Attends meetings of clubs or organizations: Not on file    Relationship status: Not on file  . Intimate partner violence    Fear of current or ex partner: Not on file    Emotionally abused: Not on file    Physically abused: Not on file    Forced sexual activity: Not on file  Other Topics Concern  . Not on file  Social  History Narrative  . Not on file         Current Outpatient Medications  Medication Sig Dispense Refill  . aspirin 81 MG tablet Take 81 mg by mouth 2 (two) times a day.     Marland Kitchen buPROPion (WELLBUTRIN XL) 300 MG 24 hr tablet Take 300 mg by mouth daily.   0  . Calcium Carb-Ergocalciferol 500-200 MG-UNIT TABS Take 1 tablet by mouth daily.     . clonazePAM (KLONOPIN) 0.5 MG tablet Take 0.5 mg by mouth 2 (two) times daily as needed for anxiety.     Marland Kitchen ibuprofen (ADVIL) 200 MG tablet Take 600 mg by mouth daily as needed (knee pain).    Marland Kitchen levothyroxine (SYNTHROID, LEVOTHROID) 75 MCG tablet Take 1 tablet (75 mcg total) by mouth daily before breakfast. 90 tablet 3  . Magnesium 500 MG TABS Take 1 tablet by mouth daily.    . metoprolol tartrate (LOPRESSOR) 50 MG tablet Take one tablet by mouth as directed prior to CT scans on 8/7 1 tablet 0  . NON FORMULARY Take 1 tablet by mouth daily. Vein Supplement - Bioflavonoid    . Omega-3 Fatty Acids (FISH OIL PO) Take 2 tablets by mouth 2 (two) times daily.     . polyethylene glycol (MIRALAX / GLYCOLAX) 17 g packet Take 17 g by mouth daily.    . pravastatin (PRAVACHOL) 40 MG tablet Take 40 mg by mouth daily.    . Vitamin D, Cholecalciferol, 50 MCG (2000 UT) CAPS Take 2,000 Units by mouth 2 (two) times a day.     No current facility-administered medications for this visit.   No Known Allergies  Review of Systems:   General: normal appetite, + decreased energy, no weight gain, no weight loss, no fever  Cardiac: no chest pain with exertion, no chest pain at rest, +SOB with exertion, no resting SOB, no PND, no orthopnea, + palpitations, no arrhythmia, no atrial fibrillation, no LE edema, + dizzy spells, no syncope  Respiratory: + exertional shortness of breath, no home oxygen, no productive cough, no dry cough, no bronchitis, no wheezing, no hemoptysis, no asthma, no pain with inspiration or cough, no sleep apnea, no CPAP at night  GI: no difficulty swallowing, no  reflux, no frequent heartburn, no hiatal hernia, no abdominal pain, no constipation, no diarrhea, no hematochezia, no hematemesis, no melena  GU:  no dysuria, no frequency, no urinary tract infection, no hematuria, no kidney stones, no kidney disease  Vascular: no pain suggestive of claudication, no pain in feet, + leg cramps, no varicose veins, no DVT, no non-healing foot ulcer  Neuro: no stroke, no TIA's, no seizures, no headaches, no temporary blindness one eye, no slurred speech, no peripheral neuropathy, no chronic pain, no instability of gait, no memory/cognitive dysfunction  Musculoskeletal: + arthritis, no joint swelling, no myalgias, no difficulty walking, normal mobility  Skin: no rash, no itching, no skin infections, no pressure sores or ulcerations  Psych: + anxiety, + depression, + nervousness, + unusual recent stress  Eyes: no blurry vision, no floaters, no recent vision changes, + wears glasses or contacts  ENT: no hearing loss, no loose or painful teeth, no dentures, last saw dentist Jan 2020  Hematologic: + easy bruising, no abnormal bleeding, no clotting disorder, no frequent epistaxis  Endocrine: no diabetes, does not check CBG's at home    Physical Exam:   BP (!) 156/84 (BP Location: Right Arm, Patient Position: Sitting, Cuff Size: Large)  Pulse 81  Temp 97.7 F (36.5 C)  Resp 16  Ht 4\' 11"  (1.499 m)  Wt 225 lb (102.1 kg)  SpO2 98% Comment: RA  BMI 45.44 kg/m  General: Obese woman, BMI 45, well-appearing  HEENT: Unremarkable, NCAT, PERLA, EOMI  Neck: no JVD, no bruits, no adenopathy  Chest: clear to auscultation, symmetrical breath sounds, no wheezes, no rhonchi  CV: RRR, grade III/VI crescendo/decrescendo murmur heard best at RSB, no diastolic murmur  Abdomen: soft, non-tender, no masses  Extremities: warm, well-perfused, pulses palpable at ankle bilat, no LE edema  Rectal/GU Deferred  Neuro: Grossly non-focal and symmetrical throughout  Skin: Clean and dry, no  rashes, no breakdown    Diagnostic Tests:   Patient Name: ANTANIYAH SHENKER Date of Exam: 01/15/2019  Medical Rec #: [id number] Height: 59.0 in  Accession #: [id number] Weight: 223.0 lb  Date of Birth: 1945-08-12 BSA: 1.93 m  Patient Age: 29 years BP: 137/77 mmHg  Patient Gender: F HR: 82 bpm.  Exam Location: [facility]  Procedure: 2D Echo  Indications: Aortic Stenosis  History: Patient has prior history of Echocardiogram examinations, most  recent 05/13/2018. LVH Aortic Valve Disease Risk Factors:  Dyslipidemia.  Sonographer: Cardell Peach RDCS (AE)  Referring Phys: [id number] [place]  1. The left ventricle has normal systolic function with an ejection fraction of 60-65%. The cavity size was normal. There is moderately increased left ventricular wall thickness. Left ventricular diastolic Doppler parameters are consistent with impaired  relaxation.  2. The right ventricle has normal systolic function. The cavity was normal. There is no increase in right ventricular wall thickness.  3. Left atrial size was mild-moderately dilated.  4. The tricuspid valve is grossly normal.  5. The aortic valve is tricuspid. Moderate calcification of the aortic valve. Aortic valve regurgitation is mild by color flow Doppler. Severe stenosis of the aortic valve.  FINDINGS  Left Ventricle: The left ventricle has normal systolic function, with an ejection fraction of 60-65%. The cavity size was normal. There is moderately increased left ventricular wall thickness. Left ventricular diastolic Doppler parameters are consistent  with impaired relaxation.  Right Ventricle: The right ventricle has normal systolic function. The cavity was normal. There is no increase in right ventricular wall thickness.  Left Atrium: Left atrial size was mild-moderately dilated.  Right Atrium: Right atrial size was normal in size. Right atrial pressure is  estimated at 10 mmHg.  Interatrial Septum: No atrial level shunt  detected by color flow Doppler.  Pericardium: There is no evidence of pericardial effusion.  Mitral Valve: The mitral valve is normal in structure. Mitral valve regurgitation was not assessed by color flow Doppler.  Tricuspid Valve: The tricuspid valve is grossly normal. Tricuspid valve regurgitation is mild by color flow Doppler.  Aortic Valve: The aortic valve is tricuspid Moderate calcification of the aortic valve. Aortic valve regurgitation is mild by color flow Doppler. There is Severe stenosis of the aortic valve, with a calculated valve area of 0.73 cm.  Pulmonic Valve: The pulmonic valve was not well visualized. Pulmonic valve regurgitation was not assessed by color flow Doppler.  Venous: The inferior vena cava measures 1.60 cm, is normal in size with greater than 50% respiratory variability.  +--------------+--------++  LEFT VENTRICLE  +----------------+---------++  +--------------+--------++ Diastology    PLAX 2D   +----------------+---------++  +--------------+--------++ LV e' lateral: 9.68 cm/s  LVIDd: 4.80 cm  +----------------+---------++  +--------------+--------++ LV E/e' lateral:9.3   LVIDs: 3.10 cm  +----------------+---------++  +--------------+--------++ LV e' medial: 7.72 cm/s  LV PW: 1.30 cm  +----------------+---------++  +--------------+--------++ LV E/e' medial: 11.6   LV IVS: 1.60 cm  +----------------+---------++  +--------------+--------++  LVOT diam: 1.60 cm   +--------------+--------++  LV SV: 70 ml   +--------------+--------++  LV SV Index: 32.88   +--------------+--------++  LVOT Area: 2.01 cm  +--------------+--------++      +--------------+--------++  +---------------+---------++  RIGHT VENTRICLE   +---------------+---------++  RV Basal diam: 3.60 cm   +---------------+---------++  RV S prime: 9.46 cm/s  +---------------+---------++  TAPSE (M-mode):1.9 cm    +---------------+---------++  +---------------+-------++-----------++  LEFT ATRIUM  Index   +---------------+-------++-----------++  LA diam: 3.80 cm1.97 cm/m   +---------------+-------++-----------++  LA Vol (A2C): 47.7 ml24.69 ml/m  +---------------+-------++-----------++  LA Vol (A4C): 53.9 ml27.90 ml/m  +---------------+-------++-----------++  LA Biplane Vol:51.9 ml26.87 ml/m  +---------------+-------++-----------++  +------------+---------++-----------++  RIGHT ATRIUM Index   +------------+---------++-----------++  RA Area: 15.00 cm   +------------+---------++-----------++  RA Volume: 36.30 ml 18.79 ml/m  +------------+---------++-----------++  +------------------+------------++  AORTIC VALVE    +------------------+------------++  AV Area (Vmax): 0.84 cm   +------------------+------------++  AV Area (Vmean): 0.69 cm   +------------------+------------++  AV Area (VTI): 0.73 cm   +------------------+------------++  AV Vmax: 391.60 cm/s   +------------------+------------++  AV Vmean: 295.400 cm/s  +------------------+------------++  AV VTI: 0.992 m   +------------------+------------++  AV Peak Grad: 61.3 mmHg   +------------------+------------++  AV Mean Grad: 39.0 mmHg   +------------------+------------++  LVOT Vmax: 164.00 cm/s   +------------------+------------++  LVOT Vmean: 101.000 cm/s  +------------------+------------++  LVOT VTI: 0.358 m   +------------------+------------++  LVOT/AV VTI ratio:0.36   +------------------+------------++  AR PHT: 389 msec   +------------------+------------++  +-------------+-------++  AORTA    +-------------+-------++  Ao Root diam:2.70 cm  +-------------+-------++  Ao Asc diam: 3.60 cm  +-------------+-------++  +--------------+----------++ +---------------+-----------++  MITRAL VALVE    TRICUSPID VALVE   +--------------+----------++ +---------------+-----------++  MV Area (PHT):2.99 cm  TR Peak grad: 24.2 mmHg   +--------------+----------++ +---------------+-----------++  MV PHT: 73.66 msec TR Vmax: 246.00 cm/s  +--------------+----------++ +---------------+-----------++  MV Decel Time:254 msec   +--------------+----------++ +--------------+-------+  +--------------+----------++ SHUNTS    MV E velocity:89.70 cm/s +--------------+-------+  +--------------+----------++ Systemic VTI: 0.36 m   MV A velocity:88.10 cm/s +--------------+-------+  +--------------+----------++ Systemic Diam:1.60 cm  MV E/A ratio: 1.02  +--------------+-------+  +--------------+----------++  +---------+-------+  IVC    +---------+-------+  IVC diam:1.60 cm  +---------+-------+  Jyl Heinz  MD  Electronically signed by Jyl Heinz MD  Signature Date/Time: 01/15/2019/4:44:10 PM    Panel Physicians Referring Physician Case Authorizing Physician  Sherren Mocha, MD (Primary)    Procedures  LEFT HEART CATH AND CORONARY ANGIOGRAPHY  Conclusion  1. Mild diffuse calcific coronary artery disease, primarily affecting the LAD and RCA territories  2. Severe aortic stenosis with peak instantaneous gradient 51 mmHg, peak to peak gradient 44 mmHg, mean gradient 33 mmHg  Recommendations: Multidisciplinary heart valve team evaluation for treatment of severe symptomatic aortic stenosis. Medical therapy for nonobstructive coronary artery disease.  Indications  Severe aortic stenosis [I35.0 (ICD-10-CM)]  Procedural Details  Technical Details INDICATION: Severe symptomatic aortic stenosis  PROCEDURAL DETAILS: There was an indwelling IV in a right antecubital vein. I attempted to change out the IV for a 4/5 French slender sheath. The wire passed into the vein but coursed medially and I was unable to to direct the wire into an adequate vein. Multiple  attempts were made. Because the patient's had an echocardiogram with no evidence of pulmonary hypertension or RV dysfunction, I felt like the right heart catheterization could be aborted. Attention was then turned to the left heart catheterization. The right wrist was then prepped, draped, and anesthetized with 1% lidocaine. Using the modified Seldinger technique a 5/6 French Slender sheath was placed in the right radial artery. Intra-arterial verapamil was administered through the radial artery sheath. IV heparin was administered after a JR4 catheter was advanced into the central aorta. Standard protocol was followed for recording of right heart pressures and sampling of oxygen saturations. Fick cardiac output was calculated. Standard Judkins catheters were used for selective coronary angiography. LV pressure is recorded and an aortic valve pullback is performed. There were no immediate procedural complications. The patient was transferred to the post catheterization recovery area for further monitoring.    Estimated blood loss <50 mL.   During this procedure medications were administered to achieve and maintain moderate conscious sedation while the patient's heart rate, blood pressure, and oxygen saturation were continuously monitored and I was present face-to-face 100% of this time.  Medications  (Filter: Administrations occurring from 02/13/19 0831 to 02/13/19 0929)          (important) Continuous medications are totaled by the amount administered until 02/13/19 0929.  Medication Rate/Dose/Volume Action  Date Time   midazolam (VERSED) injection (mg) 2 mg Given 02/13/19 0850   Total dose as of 02/13/19 0929        2 mg        fentaNYL (SUBLIMAZE) injection (mcg) 25 mcg Given 02/13/19 0850   Total dose as of 02/13/19 0929        25 mcg        Heparin (Porcine) in NaCl 1000-0.9 UT/500ML-% SOLN (mL) 500 mL Given 02/13/19 0850   Total dose as of 02/13/19 0929 500 mL Given 0850   1,000 mL         lidocaine (PF) (XYLOCAINE) 1 % injection (mL) 2 mL Given 02/13/19 0859   Total dose as of 02/13/19 0929 2 mL Given 0901   4 mL        Radial Cocktail/Verapamil only (mL) 10 mL Given 02/13/19 0902   Total dose as of 02/13/19 0929        10 mL        heparin injection (Units) 5,000 Units Given 02/13/19 0908   Total dose as of 02/13/19 0929        5,000 Units  iohexol (OMNIPAQUE) 350 MG/ML injection (mL) 60 mL Given 02/13/19 0920   Total dose as of 02/13/19 0929        60 mL        Sedation Time  Sedation Time Physician-1: 29 minutes 24 seconds  Coronary Findings  Diagnostic  Dominance: Right  Left Main  Vessel is moderate in size. There is mild diffuse disease throughout the vessel.  Left Anterior Descending  Prox LAD to Mid LAD lesion 40% stenosed  Prox LAD to Mid LAD lesion is 40% stenosed. The lesion is calcified. There is mild diffuse calcific plaquing in the proximal LAD without high-grade obstruction.  Ramus Intermedius  There is mild diffuse disease throughout the vessel.  Left Circumflex  Vessel is normal in caliber. The vessel exhibits minimal luminal irregularities. The circumflex supplies to tortuous obtuse marginal branches with no obstruction  Right Coronary Artery  Prox RCA lesion 40% stenosed  Prox RCA lesion is 40% stenosed. The lesion is moderately calcified.  Intervention  No interventions have been documented.  Left Heart  Aortic Valve There is severe aortic valve stenosis. The aortic valve is calcified. There is restricted aortic valve motion. There is moderate aortic valve calcification and restriction. The peak instantaneous gradient is 51 mmHg, Mean gradient 33 mmHg, and peak to peak gradient 44 mmHg.  Coronary Diagrams  Diagnostic  Dominance: Right   Intervention  Implants     No implant documentation for this case.  Syngo Images  Link to Procedure Log   Show images for CARDIAC CATHETERIZATION Procedure Log  Images on Long Term Storage    Show  images for Samon, Ptacek   [facility] Data   Most Recent Value  Aortic Mean Gradient 32.86 mmHg  Aortic Peak Gradient 44 mmHg  AO Systolic Pressure XX123456 mmHg  AO Diastolic Pressure 72 mmHg  AO Mean 99991111 mmHg  LV Systolic Pressure 123456 mmHg  LV Diastolic Pressure 5 mmHg  LV EDP 13 mmHg  AOp Systolic Pressure 123XX123 mmHg  AOp Diastolic Pressure 76 mmHg  AOp Mean Pressure A999333 mmHg  LVp Systolic Pressure 0000000 mmHg  LVp Diastolic Pressure 4 mmHg  LVp EDP Pressure 13 mmHg     ADDENDUM REPORT: 02/20/2019 14:15  ADDENDUM:  Reprocessed phases reviewed and measurements pertinent to valve  sizing below  Min x Max diameter 23.8 x 20.3 mm  Area 375 mm2  Perimeter 70 mm  LM height 10.6 mm  RCA height 9.5 mm  LCS: 23.5 mm  RCS: 22.5 mm  RCS: 24 mm  Suggests that 23 mm Sapien 3 correct size  Electronically Signed  By: Jenkins Rouge M.D.  On: 02/20/2019 14:15   Addended by Josue Hector, MD on 02/20/2019 2:17 PM    ADDENDUM REPORT: 02/20/2019 14:02  CLINICAL DATA: Aortic stenosis  EXAM:  Cardiac TAVR CT  TECHNIQUE:  The patient was scanned on a Siemens Force [id number] slice scanner. A 120  kV retrospective scan was triggered in the descending thoracic aorta  at 111 HU's. Gantry rotation speed was 270 msecs and collimation was  .9 mm. No beta blockade or nitro were given. The 3D data set was  reconstructed in 5% intervals of the R-R cycle. Systolic and  diastolic phases were analyzed on a dedicated work station using  MPR, MIP and VRT modes. The patient received 80 cc of contrast.  FINDINGS:  Aortic Valve: The images were extremely noisy Scan may need to be  repeated having tech reprocess Tri leaflet valve with  moderate  calcification particularly the non coronary cusp  Aorta: No aneurysm  Sinotubular Junction: 23 mm  Ascending Thoracic Aorta: 33 mm  Aortic Arch: 24 mm  Descending Thoracic Aorta: 23 mm  Sinus of Valsalva Measurements:  Non-coronary: 24 mm  Right - coronary: 22 mm  Left -  coronary: 23 mm  Coronary Artery Height above Annulus:  Left Main: 10.7 mm above annulus  Right Coronary: 10.5 mm above annulus  Virtual Basal Annulus Measurements:  Maximum/Minimum Diameter: 23.5 mm x 20.9 mm  Perimeter: 69 mm  Area: 348-378 mm2  Coronary Arteries: Sufficient height above annulus for deployment  Optimum Fluoroscopic Angle for Delivery: LAO 7 Caudal 6 degrees  IMPRESSION:  1. Sub-optimal scan with lot of noise. Study to be reprocessed may  need to be re-scanned  2. Tri leaflet AV with annular area of 348-378 mm2 suitable for 23  Sapien 3 but sinuses appear small  3. Coronary arteries suitable height above annulus for deployment  4. Optimum angiographic angle for deployment LAO 7 Caudal 6 degrees  5. Normal aortic root 3.3 cm  Jenkins Rouge  Electronically Signed  By: Jenkins Rouge M.D.  On: 02/20/2019 14:02  Impression:  This 73 year old woman has stage D, severe, symptomatic aortic stenosis with New York Heart Association class II symptoms of exertional fatigue and shortness of breath consistent with chronic diastolic congestive heart failure. I have personally reviewed her 2D echocardiogram, cardiac catheterization, and CTA studies. Echocardiogram shows a trileaflet aortic valve with moderate calcification and restricted mobility. The mean gradient is 39 mmHg and the valve area is 0.8 cm consistent with severe aortic stenosis. Cardiac catheterization shows mild nonobstructive coronary disease with normal left ventricular end-diastolic pressure. The mean gradient was 33 mmHg with a peak to peak gradient of 44 mmHg. I agree that aortic valve replacement is indicated in this patient who has developed severe aortic stenosis and is becoming symptomatic. We reviewed her CTA studies with the multidisciplinary heart valve team. She has a left main height of 10.6 mm and the RCA height of 9.5 mm which are borderline for TAVR. In addition she has relatively small sinuses and a 22 to 24  mm range. It is felt that she would be at increased risk for coronary occlusion from the native valve leaflets and that open surgery would probably be the best option for her. I reviewed all of her studies with her and her husband and the reasons that we did not feel that TAVR was the best option for her. She seems to understand that and is in agreement with open surgical aortic valve replacement.  I discussed the operative procedure with the patient and her husband including alternatives, benefits and risks; including but not limited to bleeding, blood transfusion, infection, stroke, myocardial infarction, graft failure, heart block requiring a permanent pacemaker, organ dysfunction, and death. Jerel Shepherd understands and agrees to proceed.   Plan:   Aortic valve replacement using a bioprosthetic valve.   Gaye Pollack, MD

## 2019-03-12 ENCOUNTER — Other Ambulatory Visit: Payer: Self-pay

## 2019-03-12 ENCOUNTER — Encounter (HOSPITAL_COMMUNITY): Payer: Self-pay

## 2019-03-12 ENCOUNTER — Inpatient Hospital Stay (HOSPITAL_COMMUNITY): Payer: Medicare Other | Admitting: Certified Registered Nurse Anesthetist

## 2019-03-12 ENCOUNTER — Inpatient Hospital Stay (HOSPITAL_COMMUNITY): Payer: Medicare Other

## 2019-03-12 ENCOUNTER — Inpatient Hospital Stay (HOSPITAL_COMMUNITY)
Admission: RE | Admit: 2019-03-12 | Discharge: 2019-03-17 | DRG: 220 | Disposition: A | Discharge status: Home / Self Care | Payer: Medicare Other | Attending: Surgery | Admitting: Surgery

## 2019-03-12 ENCOUNTER — Inpatient Hospital Stay (HOSPITAL_COMMUNITY): Payer: Medicare Other | Admitting: Physician Assistant

## 2019-03-12 ENCOUNTER — Encounter (HOSPITAL_COMMUNITY)
Admission: RE | Disposition: A | Discharge status: Home / Self Care | Payer: Self-pay | Source: Home / Self Care | Attending: Surgery

## 2019-03-12 DIAGNOSIS — F329 Major depressive disorder, single episode, unspecified: Secondary | ICD-10-CM | POA: Diagnosis present

## 2019-03-12 DIAGNOSIS — E785 Hyperlipidemia, unspecified: Secondary | ICD-10-CM | POA: Diagnosis present

## 2019-03-12 DIAGNOSIS — I350 Nonrheumatic aortic (valve) stenosis: Secondary | ICD-10-CM | POA: Diagnosis not present

## 2019-03-12 DIAGNOSIS — Z20828 Contact with and (suspected) exposure to other viral communicable diseases: Secondary | ICD-10-CM | POA: Diagnosis present

## 2019-03-12 DIAGNOSIS — Z7982 Long term (current) use of aspirin: Secondary | ICD-10-CM

## 2019-03-12 DIAGNOSIS — Z8249 Family history of ischemic heart disease and other diseases of the circulatory system: Secondary | ICD-10-CM

## 2019-03-12 DIAGNOSIS — I2510 Atherosclerotic heart disease of native coronary artery without angina pectoris: Secondary | ICD-10-CM | POA: Diagnosis present

## 2019-03-12 DIAGNOSIS — M8580 Other specified disorders of bone density and structure, unspecified site: Secondary | ICD-10-CM | POA: Diagnosis present

## 2019-03-12 DIAGNOSIS — D62 Acute posthemorrhagic anemia: Secondary | ICD-10-CM | POA: Diagnosis not present

## 2019-03-12 DIAGNOSIS — Z8719 Personal history of other diseases of the digestive system: Secondary | ICD-10-CM

## 2019-03-12 DIAGNOSIS — E039 Hypothyroidism, unspecified: Secondary | ICD-10-CM | POA: Diagnosis present

## 2019-03-12 DIAGNOSIS — Z9049 Acquired absence of other specified parts of digestive tract: Secondary | ICD-10-CM

## 2019-03-12 DIAGNOSIS — Z807 Family history of other malignant neoplasms of lymphoid, hematopoietic and related tissues: Secondary | ICD-10-CM | POA: Diagnosis not present

## 2019-03-12 DIAGNOSIS — Z79899 Other long term (current) drug therapy: Secondary | ICD-10-CM

## 2019-03-12 DIAGNOSIS — Z801 Family history of malignant neoplasm of trachea, bronchus and lung: Secondary | ICD-10-CM | POA: Diagnosis not present

## 2019-03-12 DIAGNOSIS — Z79890 Hormone replacement therapy (postmenopausal): Secondary | ICD-10-CM

## 2019-03-12 DIAGNOSIS — Z803 Family history of malignant neoplasm of breast: Secondary | ICD-10-CM

## 2019-03-12 DIAGNOSIS — Z791 Long term (current) use of non-steroidal anti-inflammatories (NSAID): Secondary | ICD-10-CM | POA: Diagnosis not present

## 2019-03-12 DIAGNOSIS — J9811 Atelectasis: Secondary | ICD-10-CM | POA: Diagnosis not present

## 2019-03-12 DIAGNOSIS — Z952 Presence of prosthetic heart valve: Secondary | ICD-10-CM

## 2019-03-12 DIAGNOSIS — Z006 Encounter for examination for normal comparison and control in clinical research program: Secondary | ICD-10-CM | POA: Diagnosis not present

## 2019-03-12 HISTORY — DX: Presence of prosthetic heart valve: Z95.2

## 2019-03-12 HISTORY — PX: AORTIC VALVE REPLACEMENT: SHX41

## 2019-03-12 HISTORY — PX: TEE WITHOUT CARDIOVERSION: SHX5443

## 2019-03-12 LAB — POCT I-STAT 7, (LYTES, BLD GAS, ICA,H+H)
Acid-base deficit: 4 mmol/L — ABNORMAL HIGH (ref 0.0–2.0)
Acid-base deficit: 4 mmol/L — ABNORMAL HIGH (ref 0.0–2.0)
Acid-base deficit: 1 mmol/L (ref 0.0–2.0)
Acid-base deficit: 4 mmol/L — ABNORMAL HIGH (ref 0.0–2.0)
Acid-base deficit: 2 mmol/L (ref 0.0–2.0)
Bicarbonate: 22.8 mmol/L (ref 20.0–28.0)
Bicarbonate: 22.3 mmol/L (ref 20.0–28.0)
Bicarbonate: 24.2 mmol/L (ref 20.0–28.0)
Bicarbonate: 21.8 mmol/L (ref 20.0–28.0)
Bicarbonate: 23.7 mmol/L (ref 20.0–28.0)
Calcium, Ion: 1.19 mmol/L (ref 1.15–1.40)
Calcium, Ion: 1.11 mmol/L — ABNORMAL LOW (ref 1.15–1.40)
Calcium, Ion: 1.11 mmol/L — ABNORMAL LOW (ref 1.15–1.40)
Calcium, Ion: 1.11 mmol/L — ABNORMAL LOW (ref 1.15–1.40)
Calcium, Ion: 1.18 mmol/L (ref 1.15–1.40)
HCT: 30 % — ABNORMAL LOW (ref 36.0–46.0)
HCT: 24 % — ABNORMAL LOW (ref 36.0–46.0)
HCT: 25 % — ABNORMAL LOW (ref 36.0–46.0)
HCT: 26 % — ABNORMAL LOW (ref 36.0–46.0)
HCT: 29 % — ABNORMAL LOW (ref 36.0–46.0)
Hemoglobin: 10.2 g/dL — ABNORMAL LOW (ref 12.0–15.0)
Hemoglobin: 8.2 g/dL — ABNORMAL LOW (ref 12.0–15.0)
Hemoglobin: 8.5 g/dL — ABNORMAL LOW (ref 12.0–15.0)
Hemoglobin: 8.8 g/dL — ABNORMAL LOW (ref 12.0–15.0)
Hemoglobin: 9.9 g/dL — ABNORMAL LOW (ref 12.0–15.0)
O2 Saturation: 93 %
O2 Saturation: 97 %
O2 Saturation: 100 %
O2 Saturation: 99 %
O2 Saturation: 99 %
Patient temperature: 35.3
Patient temperature: 35.9
Patient temperature: 36.7
Patient temperature: 36.6
Patient temperature: 36.3
Potassium: 3.3 mmol/L — ABNORMAL LOW (ref 3.5–5.1)
Potassium: 3.5 mmol/L (ref 3.5–5.1)
Potassium: 3.9 mmol/L (ref 3.5–5.1)
Potassium: 3.9 mmol/L (ref 3.5–5.1)
Potassium: 3.8 mmol/L (ref 3.5–5.1)
Sodium: 146 mmol/L — ABNORMAL HIGH (ref 135–145)
Sodium: 147 mmol/L — ABNORMAL HIGH (ref 135–145)
Sodium: 147 mmol/L — ABNORMAL HIGH (ref 135–145)
Sodium: 145 mmol/L (ref 135–145)
Sodium: 144 mmol/L (ref 135–145)
TCO2: 24 mmol/L (ref 22–32)
TCO2: 24 mmol/L (ref 22–32)
TCO2: 26 mmol/L (ref 22–32)
TCO2: 23 mmol/L (ref 22–32)
TCO2: 25 mmol/L (ref 22–32)
pCO2 arterial: 48.1 mmHg — ABNORMAL HIGH (ref 32.0–48.0)
pCO2 arterial: 44.9 mmHg (ref 32.0–48.0)
pCO2 arterial: 43.4 mmHg (ref 32.0–48.0)
pCO2 arterial: 39 mmHg (ref 32.0–48.0)
pCO2 arterial: 40.2 mmHg (ref 32.0–48.0)
pH, Arterial: 7.276 — ABNORMAL LOW (ref 7.350–7.450)
pH, Arterial: 7.297 — ABNORMAL LOW (ref 7.350–7.450)
pH, Arterial: 7.353 (ref 7.350–7.450)
pH, Arterial: 7.354 (ref 7.350–7.450)
pH, Arterial: 7.376 (ref 7.350–7.450)
pO2, Arterial: 69 mmHg — ABNORMAL LOW (ref 83.0–108.0)
pO2, Arterial: 101 mmHg (ref 83.0–108.0)
pO2, Arterial: 176 mmHg — ABNORMAL HIGH (ref 83.0–108.0)
pO2, Arterial: 156 mmHg — ABNORMAL HIGH (ref 83.0–108.0)
pO2, Arterial: 144 mmHg — ABNORMAL HIGH (ref 83.0–108.0)

## 2019-03-12 LAB — CBC
HCT: 29.7 % — ABNORMAL LOW (ref 36.0–46.0)
HCT: 30.5 % — ABNORMAL LOW (ref 36.0–46.0)
Hemoglobin: 10.1 g/dL — ABNORMAL LOW (ref 12.0–15.0)
Hemoglobin: 10.4 g/dL — ABNORMAL LOW (ref 12.0–15.0)
MCH: 28.9 pg (ref 26.0–34.0)
MCH: 28.7 pg (ref 26.0–34.0)
MCHC: 34 g/dL (ref 30.0–36.0)
MCHC: 34.1 g/dL (ref 30.0–36.0)
MCV: 85.1 fL (ref 80.0–100.0)
MCV: 84.3 fL (ref 80.0–100.0)
Platelets: 180 10*3/uL (ref 150–400)
Platelets: 177 10*3/uL (ref 150–400)
RBC: 3.49 MIL/uL — ABNORMAL LOW (ref 3.87–5.11)
RBC: 3.62 MIL/uL — ABNORMAL LOW (ref 3.87–5.11)
RDW: 13.8 % (ref 11.5–15.5)
RDW: 13.8 % (ref 11.5–15.5)
WBC: 10.4 10*3/uL (ref 4.0–10.5)
WBC: 11.9 10*3/uL — ABNORMAL HIGH (ref 4.0–10.5)
nRBC: 0 % (ref 0.0–0.2)
nRBC: 0 % (ref 0.0–0.2)

## 2019-03-12 LAB — BLOOD GAS, ARTERIAL
Acid-Base Excess: 0.6 mmol/L (ref 0.0–2.0)
Bicarbonate: 24.4 mmol/L (ref 20.0–28.0)
O2 Saturation: 98.6 %
Patient temperature: 98.6
pCO2 arterial: 37.6 mmHg (ref 32.0–48.0)
pH, Arterial: 7.428 (ref 7.350–7.450)
pO2, Arterial: 128 mmHg — ABNORMAL HIGH (ref 83.0–108.0)

## 2019-03-12 LAB — GLUCOSE, CAPILLARY
Glucose-Capillary: 101 mg/dL — ABNORMAL HIGH (ref 70–99)
Glucose-Capillary: 108 mg/dL — ABNORMAL HIGH (ref 70–99)
Glucose-Capillary: 83 mg/dL (ref 70–99)
Glucose-Capillary: 132 mg/dL — ABNORMAL HIGH (ref 70–99)
Glucose-Capillary: 147 mg/dL — ABNORMAL HIGH (ref 70–99)
Glucose-Capillary: 124 mg/dL — ABNORMAL HIGH (ref 70–99)
Glucose-Capillary: 113 mg/dL — ABNORMAL HIGH (ref 70–99)

## 2019-03-12 LAB — POCT I-STAT 4, (NA,K, GLUC, HGB,HCT)
Glucose, Bld: 103 mg/dL — ABNORMAL HIGH (ref 70–99)
HCT: 30 % — ABNORMAL LOW (ref 36.0–46.0)
Hemoglobin: 10.2 g/dL — ABNORMAL LOW (ref 12.0–15.0)
Potassium: 3.2 mmol/L — ABNORMAL LOW (ref 3.5–5.1)
Sodium: 144 mmol/L (ref 135–145)

## 2019-03-12 LAB — BASIC METABOLIC PANEL
Anion gap: 8 (ref 5–15)
BUN: 11 mg/dL (ref 8–23)
CO2: 23 mmol/L (ref 22–32)
Calcium: 7.9 mg/dL — ABNORMAL LOW (ref 8.9–10.3)
Chloride: 110 mmol/L (ref 98–111)
Creatinine, Ser: 0.87 mg/dL (ref 0.44–1.00)
GFR calc Af Amer: >60 mL/min (ref >60)
GFR calc non Af Amer: >60 mL/min (ref >60)
Glucose, Bld: 145 mg/dL — ABNORMAL HIGH (ref 70–99)
Potassium: 3.8 mmol/L (ref 3.5–5.1)
Sodium: 141 mmol/L (ref 135–145)

## 2019-03-12 LAB — HEMOGLOBIN AND HEMATOCRIT, BLOOD
HCT: 28.2 % — ABNORMAL LOW (ref 36.0–46.0)
Hemoglobin: 9.4 g/dL — ABNORMAL LOW (ref 12.0–15.0)

## 2019-03-12 LAB — POCT I-STAT, CHEM 8
BUN: 10 mg/dL (ref 8–23)
Calcium, Ion: 1.14 mmol/L — ABNORMAL LOW (ref 1.15–1.40)
Chloride: 107 mmol/L (ref 98–111)
Creatinine, Ser: 0.8 mg/dL (ref 0.44–1.00)
Glucose, Bld: 141 mg/dL — ABNORMAL HIGH (ref 70–99)
HCT: 32 % — ABNORMAL LOW (ref 36.0–46.0)
Hemoglobin: 10.9 g/dL — ABNORMAL LOW (ref 12.0–15.0)
Potassium: 3.8 mmol/L (ref 3.5–5.1)
Sodium: 144 mmol/L (ref 135–145)
TCO2: 25 mmol/L (ref 22–32)

## 2019-03-12 LAB — APTT: aPTT: 37 seconds — ABNORMAL HIGH (ref 24–36)

## 2019-03-12 LAB — PLATELET COUNT: Platelets: 202 10*3/uL (ref 150–400)

## 2019-03-12 LAB — PROTIME-INR
INR: 1.6 — ABNORMAL HIGH (ref 0.8–1.2)
Prothrombin Time: 18.5 s — ABNORMAL HIGH (ref 11.4–15.2)

## 2019-03-12 LAB — MAGNESIUM: Magnesium: 3.1 mg/dL — ABNORMAL HIGH (ref 1.7–2.4)

## 2019-03-12 SURGERY — REPLACEMENT, AORTIC VALVE, OPEN
Anesthesia: General | Site: Chest

## 2019-03-12 MED ORDER — CHLORHEXIDINE GLUCONATE 0.12% ORAL RINSE (MEDLINE KIT): 15.0000 mL | Freq: Two times a day (BID) | OROMUCOSAL | Status: DC

## 2019-03-12 MED ORDER — MUPIROCIN 2 % EX OINT
1.0000 "application " | TOPICAL_OINTMENT | Freq: Two times a day (BID) | CUTANEOUS | Status: AC
  Administered 2019-03-12 – 2019-03-17 (×10): 1 via NASAL
  Filled 2019-03-12 (×2): qty 22

## 2019-03-12 MED ORDER — MORPHINE SULFATE (PF) 2 MG/ML IV SOLN
1.0000 mg | INTRAVENOUS | Status: DC | PRN
  Administered 2019-03-12 – 2019-03-13 (×2): 2 mg via INTRAVENOUS
  Filled 2019-03-12 (×2): qty 1

## 2019-03-12 MED ORDER — VANCOMYCIN HCL IN DEXTROSE 1-5 GM/200ML-% IV SOLN
1000.0000 mg | Freq: Once | INTRAVENOUS | Status: AC
  Administered 2019-03-12: 21:00:00 1000 mg via INTRAVENOUS
  Filled 2019-03-12: qty 200

## 2019-03-12 MED ORDER — SODIUM CHLORIDE 0.9% FLUSH: 3.0000 mL | INTRAVENOUS | Status: DC | PRN

## 2019-03-12 MED ORDER — CALCIUM CHLORIDE 10 % IV SOLN
INTRAVENOUS | Status: DC | PRN
  Administered 2019-03-12 (×2): 100 mg via INTRAVENOUS

## 2019-03-12 MED ORDER — INSULIN REGULAR BOLUS VIA INFUSION
0.0000 [IU] | Freq: Three times a day (TID) | INTRAVENOUS | Status: DC
  Filled 2019-03-12: qty 10

## 2019-03-12 MED ORDER — THROMBIN (RECOMBINANT) 20000 UNITS EX SOLR
CUTANEOUS | Status: AC
  Filled 2019-03-12: qty 20000

## 2019-03-12 MED ORDER — THROMBIN 20000 UNITS EX SOLR
OROMUCOSAL | Status: DC | PRN
  Administered 2019-03-12 (×3): 3 mL via TOPICAL

## 2019-03-12 MED ORDER — ALBUMIN HUMAN 5 % IV SOLN
250.0000 mL | INTRAVENOUS | Status: DC | PRN
  Administered 2019-03-12 (×2): 12.5 g via INTRAVENOUS

## 2019-03-12 MED ORDER — PHENYLEPHRINE 40 MCG/ML (10ML) SYRINGE FOR IV PUSH (FOR BLOOD PRESSURE SUPPORT)
PREFILLED_SYRINGE | INTRAVENOUS | Status: DC | PRN
  Administered 2019-03-12: 80 ug via INTRAVENOUS

## 2019-03-12 MED ORDER — ONDANSETRON HCL 4 MG/2ML IJ SOLN
4.0000 mg | Freq: Four times a day (QID) | INTRAMUSCULAR | Status: DC | PRN
  Administered 2019-03-12 – 2019-03-14 (×3): 4 mg via INTRAVENOUS
  Filled 2019-03-12 (×3): qty 2

## 2019-03-12 MED ORDER — SODIUM CHLORIDE 0.9 % IV SOLN
INTRAVENOUS | Status: DC | PRN
  Administered 2019-03-12: 08:00:00 25 ug/min via INTRAVENOUS

## 2019-03-12 MED ORDER — HEPARIN SODIUM (PORCINE) 1000 UNIT/ML IJ SOLN
INTRAMUSCULAR | Status: AC
  Filled 2019-03-12: qty 1

## 2019-03-12 MED ORDER — ASPIRIN 81 MG PO CHEW: 324.0000 mg | CHEWABLE_TABLET | Freq: Every day | ORAL | Status: DC

## 2019-03-12 MED ORDER — MIDAZOLAM HCL (PF) 5 MG/ML IJ SOLN
INTRAMUSCULAR | Status: DC | PRN
  Administered 2019-03-12 (×3): 2 mg via INTRAVENOUS
  Administered 2019-03-12: 3 mg via INTRAVENOUS
  Administered 2019-03-12: 1 mg via INTRAVENOUS

## 2019-03-12 MED ORDER — CHLORHEXIDINE GLUCONATE CLOTH 2 % EX PADS
6.0000 | MEDICATED_PAD | Freq: Every day | CUTANEOUS | Status: DC
  Administered 2019-03-12 – 2019-03-14 (×3): 6 via TOPICAL

## 2019-03-12 MED ORDER — FENTANYL CITRATE (PF) 250 MCG/5ML IJ SOLN
INTRAMUSCULAR | Status: AC
  Filled 2019-03-12: qty 20

## 2019-03-12 MED ORDER — SODIUM CHLORIDE 0.9 % IV SOLN
INTRAVENOUS | Status: DC
  Administered 2019-03-12: 13:00:00 via INTRAVENOUS

## 2019-03-12 MED ORDER — TRAMADOL HCL 50 MG PO TABS: 50.0000 mg | ORAL_TABLET | ORAL | Status: DC | PRN

## 2019-03-12 MED ORDER — SODIUM CHLORIDE 0.9 % IV SOLN
1.5000 g | Freq: Two times a day (BID) | INTRAVENOUS | Status: AC
  Administered 2019-03-12 – 2019-03-14 (×4): 1.5 g via INTRAVENOUS
  Filled 2019-03-12 (×4): qty 1.5

## 2019-03-12 MED ORDER — PROTAMINE SULFATE 10 MG/ML IV SOLN
INTRAVENOUS | Status: AC
  Filled 2019-03-12: qty 25

## 2019-03-12 MED ORDER — SODIUM CHLORIDE 0.9 % IV SOLN
INTRAVENOUS | Status: DC | PRN
  Administered 2019-03-12: 11:00:00 25 ug/min via INTRAVENOUS

## 2019-03-12 MED ORDER — SODIUM CHLORIDE 0.9% FLUSH
3.0000 mL | Freq: Two times a day (BID) | INTRAVENOUS | Status: DC
  Administered 2019-03-13 (×2): 3 mL via INTRAVENOUS

## 2019-03-12 MED ORDER — SODIUM BICARBONATE 8.4 % IV SOLN
50.0000 meq | Freq: Once | INTRAVENOUS | Status: AC
  Administered 2019-03-12: 50 meq via INTRAVENOUS

## 2019-03-12 MED ORDER — SODIUM CHLORIDE 0.9 % IV SOLN: 250.0000 mL | INTRAVENOUS | Status: DC

## 2019-03-12 MED ORDER — ASPIRIN EC 325 MG PO TBEC
325.0000 mg | DELAYED_RELEASE_TABLET | Freq: Every day | ORAL | Status: DC
  Administered 2019-03-13 – 2019-03-17 (×5): 325 mg via ORAL
  Filled 2019-03-12 (×5): qty 1

## 2019-03-12 MED ORDER — ACETAMINOPHEN 650 MG RE SUPP
650.0000 mg | Freq: Once | RECTAL | Status: AC
  Administered 2019-03-12: 13:00:00 650 mg via RECTAL

## 2019-03-12 MED ORDER — DEXMEDETOMIDINE HCL IN NACL 200 MCG/50ML IV SOLN
INTRAVENOUS | Status: AC
  Filled 2019-03-12: qty 50

## 2019-03-12 MED ORDER — FENTANYL CITRATE (PF) 250 MCG/5ML IJ SOLN
INTRAMUSCULAR | Status: DC | PRN
  Administered 2019-03-12: 150 ug via INTRAVENOUS
  Administered 2019-03-12: 50 ug via INTRAVENOUS
  Administered 2019-03-12 (×2): 100 ug via INTRAVENOUS
  Administered 2019-03-12: 150 ug via INTRAVENOUS
  Administered 2019-03-12: 250 ug via INTRAVENOUS
  Administered 2019-03-12 (×2): 100 ug via INTRAVENOUS

## 2019-03-12 MED ORDER — PROPOFOL 10 MG/ML IV BOLUS
INTRAVENOUS | Status: AC
  Filled 2019-03-12: qty 20

## 2019-03-12 MED ORDER — ACETAMINOPHEN 160 MG/5ML PO SOLN: 1000.0000 mg | Freq: Four times a day (QID) | ORAL | Status: DC

## 2019-03-12 MED ORDER — POTASSIUM CHLORIDE 10 MEQ/50ML IV SOLN
10.0000 meq | INTRAVENOUS | Status: AC
  Administered 2019-03-12 (×3): 10 meq via INTRAVENOUS

## 2019-03-12 MED ORDER — MIDAZOLAM HCL (PF) 10 MG/2ML IJ SOLN
INTRAMUSCULAR | Status: AC
  Filled 2019-03-12: qty 2

## 2019-03-12 MED ORDER — METOPROLOL TARTRATE 12.5 MG HALF TABLET
12.5000 mg | ORAL_TABLET | Freq: Once | ORAL | Status: AC
  Administered 2019-03-12: 06:00:00 12.5 mg via ORAL
  Filled 2019-03-12: qty 1

## 2019-03-12 MED ORDER — INSULIN REGULAR(HUMAN) IN NACL 100-0.9 UT/100ML-% IV SOLN
INTRAVENOUS | Status: DC
  Administered 2019-03-12: 2.7 [IU]/h via INTRAVENOUS

## 2019-03-12 MED ORDER — SODIUM CHLORIDE 0.9% FLUSH: 10.0000 mL | INTRAVENOUS | Status: DC | PRN

## 2019-03-12 MED ORDER — CALCIUM CHLORIDE 10 % IV SOLN
INTRAVENOUS | Status: AC
  Filled 2019-03-12: qty 10

## 2019-03-12 MED ORDER — PANTOPRAZOLE SODIUM 40 MG PO TBEC
40.0000 mg | DELAYED_RELEASE_TABLET | Freq: Every day | ORAL | Status: DC
  Administered 2019-03-14 – 2019-03-17 (×4): 40 mg via ORAL
  Filled 2019-03-12 (×3): qty 1

## 2019-03-12 MED ORDER — SODIUM BICARBONATE 8.4 % IV SOLN
50.0000 meq | Freq: Once | INTRAVENOUS | Status: AC
  Administered 2019-03-12: 13:00:00 50 meq via INTRAVENOUS

## 2019-03-12 MED ORDER — HEPARIN SODIUM (PORCINE) 1000 UNIT/ML IJ SOLN
INTRAMUSCULAR | Status: DC | PRN
  Administered 2019-03-12: 35000 [IU] via INTRAVENOUS

## 2019-03-12 MED ORDER — PHENYLEPHRINE HCL-NACL 20-0.9 MG/250ML-% IV SOLN
0.0000 ug/min | INTRAVENOUS | Status: DC
  Administered 2019-03-12: 13:00:00 10 ug/min via INTRAVENOUS

## 2019-03-12 MED ORDER — BISACODYL 10 MG RE SUPP: 10.0000 mg | Freq: Every day | RECTAL | Status: DC

## 2019-03-12 MED ORDER — SODIUM CHLORIDE 0.45 % IV SOLN: INTRAVENOUS | Status: DC | PRN

## 2019-03-12 MED ORDER — MIDAZOLAM HCL 2 MG/2ML IJ SOLN: 2.0000 mg | INTRAMUSCULAR | Status: DC | PRN

## 2019-03-12 MED ORDER — METOPROLOL TARTRATE 5 MG/5ML IV SOLN: 2.5000 mg | INTRAVENOUS | Status: DC | PRN

## 2019-03-12 MED ORDER — SODIUM CHLORIDE 0.9 % IV SOLN
INTRAVENOUS | Status: DC | PRN
  Administered 2019-03-12: 11:00:00 750 mg via INTRAVENOUS

## 2019-03-12 MED ORDER — ROCURONIUM BROMIDE 10 MG/ML (PF) SYRINGE
PREFILLED_SYRINGE | INTRAVENOUS | Status: DC | PRN
  Administered 2019-03-12: 50 mg via INTRAVENOUS
  Administered 2019-03-12: 25 mg via INTRAVENOUS
  Administered 2019-03-12: 100 mg via INTRAVENOUS

## 2019-03-12 MED ORDER — CHLORHEXIDINE GLUCONATE 4 % EX LIQD: 30.0000 mL | CUTANEOUS | Status: DC

## 2019-03-12 MED ORDER — BISACODYL 5 MG PO TBEC
10.0000 mg | DELAYED_RELEASE_TABLET | Freq: Every day | ORAL | Status: DC
  Administered 2019-03-13 – 2019-03-16 (×4): 10 mg via ORAL
  Filled 2019-03-12 (×4): qty 2

## 2019-03-12 MED ORDER — METOPROLOL TARTRATE 12.5 MG HALF TABLET
12.5000 mg | ORAL_TABLET | Freq: Two times a day (BID) | ORAL | Status: DC
  Administered 2019-03-12 – 2019-03-17 (×8): 12.5 mg via ORAL
  Filled 2019-03-12 (×10): qty 1

## 2019-03-12 MED ORDER — ACETAMINOPHEN 500 MG PO TABS
1000.0000 mg | ORAL_TABLET | Freq: Four times a day (QID) | ORAL | Status: DC
  Administered 2019-03-13 – 2019-03-17 (×10): 1000 mg via ORAL
  Filled 2019-03-12 (×11): qty 2

## 2019-03-12 MED ORDER — LEVOTHYROXINE SODIUM 75 MCG PO TABS
75.0000 ug | ORAL_TABLET | Freq: Every day | ORAL | Status: DC
  Administered 2019-03-13 – 2019-03-17 (×5): 75 ug via ORAL
  Filled 2019-03-12 (×5): qty 1

## 2019-03-12 MED ORDER — ORAL CARE MOUTH RINSE
15.0000 mL | Freq: Two times a day (BID) | OROMUCOSAL | Status: DC
  Administered 2019-03-12 – 2019-03-13 (×2): 15 mL via OROMUCOSAL

## 2019-03-12 MED ORDER — CLONAZEPAM 0.5 MG PO TABS
0.5000 mg | ORAL_TABLET | Freq: Every day | ORAL | Status: DC | PRN
  Administered 2019-03-13 – 2019-03-16 (×2): 0.5 mg via ORAL
  Filled 2019-03-12 (×2): qty 1

## 2019-03-12 MED ORDER — HEMOSTATIC AGENTS (NO CHARGE) OPTIME
TOPICAL | Status: DC | PRN
  Administered 2019-03-12 (×2): 1 via TOPICAL

## 2019-03-12 MED ORDER — LACTATED RINGERS IV SOLN
INTRAVENOUS | Status: DC
  Administered 2019-03-12 – 2019-03-13 (×2): via INTRAVENOUS

## 2019-03-12 MED ORDER — CHLORHEXIDINE GLUCONATE 0.12 % MT SOLN
15.0000 mL | OROMUCOSAL | Status: AC
  Administered 2019-03-12: 13:00:00 15 mL via OROMUCOSAL

## 2019-03-12 MED ORDER — DEXMEDETOMIDINE HCL IN NACL 200 MCG/50ML IV SOLN
0.0000 ug/kg/h | INTRAVENOUS | Status: DC
  Administered 2019-03-12: 0.7 ug/kg/h via INTRAVENOUS

## 2019-03-12 MED ORDER — ORAL CARE MOUTH RINSE
15.0000 mL | OROMUCOSAL | Status: DC
  Administered 2019-03-12 (×2): 15 mL via OROMUCOSAL

## 2019-03-12 MED ORDER — BUPROPION HCL ER (XL) 300 MG PO TB24
300.0000 mg | ORAL_TABLET | Freq: Every day | ORAL | Status: DC
  Administered 2019-03-14 – 2019-03-17 (×4): 300 mg via ORAL
  Filled 2019-03-12 (×5): qty 1

## 2019-03-12 MED ORDER — METOPROLOL TARTRATE 25 MG/10 ML ORAL SUSPENSION
12.5000 mg | Freq: Two times a day (BID) | ORAL | Status: DC
  Filled 2019-03-12 (×7): qty 5

## 2019-03-12 MED ORDER — SODIUM CHLORIDE 0.9 % IV SOLN
INTRAVENOUS | Status: DC | PRN
  Administered 2019-03-12: 1 [IU]/h via INTRAVENOUS

## 2019-03-12 MED ORDER — LACTATED RINGERS IV SOLN
INTRAVENOUS | Status: DC | PRN
  Administered 2019-03-12 (×3): via INTRAVENOUS

## 2019-03-12 MED ORDER — PROPOFOL 10 MG/ML IV BOLUS
INTRAVENOUS | Status: DC | PRN
  Administered 2019-03-12: 50 mg via INTRAVENOUS

## 2019-03-12 MED ORDER — NITROGLYCERIN IN D5W 200-5 MCG/ML-% IV SOLN
0.0000 ug/min | INTRAVENOUS | Status: DC
  Administered 2019-03-12: 13:00:00 10 ug/min via INTRAVENOUS

## 2019-03-12 MED ORDER — SODIUM CHLORIDE 0.9% FLUSH
10.0000 mL | Freq: Two times a day (BID) | INTRAVENOUS | Status: DC
  Administered 2019-03-12 – 2019-03-13 (×4): 10 mL
  Administered 2019-03-14: 20 mL

## 2019-03-12 MED ORDER — ALBUMIN HUMAN 5 % IV SOLN
INTRAVENOUS | Status: DC | PRN
  Administered 2019-03-12: 11:00:00 via INTRAVENOUS

## 2019-03-12 MED ORDER — 0.9 % SODIUM CHLORIDE (POUR BTL) OPTIME
TOPICAL | Status: DC | PRN
  Administered 2019-03-12: 09:00:00 1000 mL

## 2019-03-12 MED ORDER — CHLORHEXIDINE GLUCONATE 0.12 % MT SOLN
15.0000 mL | Freq: Once | OROMUCOSAL | Status: AC
  Administered 2019-03-12: 15 mL via OROMUCOSAL
  Filled 2019-03-12: qty 15

## 2019-03-12 MED ORDER — LACTATED RINGERS IV SOLN
INTRAVENOUS | Status: DC
  Administered 2019-03-12: 13:00:00 via INTRAVENOUS

## 2019-03-12 MED ORDER — PROTAMINE SULFATE 10 MG/ML IV SOLN
INTRAVENOUS | Status: DC | PRN
  Administered 2019-03-12 (×3): 50 mg via INTRAVENOUS
  Administered 2019-03-12: 10 mg via INTRAVENOUS
  Administered 2019-03-12 (×3): 50 mg via INTRAVENOUS
  Administered 2019-03-12: 40 mg via INTRAVENOUS

## 2019-03-12 MED ORDER — OXYCODONE HCL 5 MG PO TABS: 5.0000 mg | ORAL_TABLET | ORAL | Status: DC | PRN

## 2019-03-12 MED ORDER — FAMOTIDINE IN NACL 20-0.9 MG/50ML-% IV SOLN
20.0000 mg | Freq: Two times a day (BID) | INTRAVENOUS | Status: AC
  Administered 2019-03-12 (×2): 20 mg via INTRAVENOUS
  Filled 2019-03-12: qty 50

## 2019-03-12 MED ORDER — ACETAMINOPHEN 160 MG/5ML PO SOLN: 650.0000 mg | Freq: Once | ORAL | Status: AC

## 2019-03-12 MED ORDER — SODIUM CHLORIDE 0.9 % IV SOLN: INTRAVENOUS | Status: DC | PRN

## 2019-03-12 MED ORDER — LACTATED RINGERS IV SOLN: 500.0000 mL | Freq: Once | INTRAVENOUS | Status: DC | PRN

## 2019-03-12 MED ORDER — DOCUSATE SODIUM 100 MG PO CAPS
200.0000 mg | ORAL_CAPSULE | Freq: Every day | ORAL | Status: DC
  Administered 2019-03-13 – 2019-03-15 (×3): 200 mg via ORAL
  Administered 2019-03-16: 100 mg via ORAL
  Filled 2019-03-12 (×4): qty 2

## 2019-03-12 MED ORDER — MAGNESIUM SULFATE 4 GM/100ML IV SOLN
4.0000 g | Freq: Once | INTRAVENOUS | Status: AC
  Administered 2019-03-12: 13:00:00 4 g via INTRAVENOUS
  Filled 2019-03-12: qty 100

## 2019-03-12 MED ORDER — PRAVASTATIN SODIUM 40 MG PO TABS
40.0000 mg | ORAL_TABLET | Freq: Every day | ORAL | Status: DC
  Administered 2019-03-13 – 2019-03-17 (×5): 40 mg via ORAL
  Filled 2019-03-12 (×5): qty 1

## 2019-03-12 SURGICAL SUPPLY — 81 items
ADAPTER CARDIO PERF ANTE/RETRO (ADAPTER) ×4 IMPLANT
ADPR PRFSN 84XANTGRD RTRGD (ADAPTER) ×2
BAG DECANTER FOR FLEXI CONT (MISCELLANEOUS) ×4 IMPLANT
BLADE CLIPPER SURG (BLADE) ×2 IMPLANT
BLADE STERNUM SYSTEM 6 (BLADE) ×4 IMPLANT
BLADE SURG 15 STRL LF DISP TIS (BLADE) ×2 IMPLANT
BLADE SURG 15 STRL SS (BLADE) ×4
CANISTER SUCT 3000ML PPV (MISCELLANEOUS) ×4 IMPLANT
CANNULA GUNDRY RCSP 15FR (MISCELLANEOUS) ×4 IMPLANT
CATH HEART VENT LEFT (CATHETERS) ×2 IMPLANT
CATH ROBINSON RED A/P 18FR (CATHETERS) ×12 IMPLANT
CATH THORACIC 36FR (CATHETERS) ×4 IMPLANT
CATH THORACIC 36FR RT ANG (CATHETERS) ×4 IMPLANT
CLOSURE WOUND 1/2 X4 (GAUZE/BANDAGES/DRESSINGS) ×1
CONT SPEC 4OZ CLIKSEAL STRL BL (MISCELLANEOUS) ×4 IMPLANT
COVER SURGICAL LIGHT HANDLE (MISCELLANEOUS) ×4 IMPLANT
COVER WAND RF STERILE (DRAPES) ×4 IMPLANT
DRAPE CARDIOVASCULAR INCISE (DRAPES) ×4
DRAPE SLUSH/WARMER DISC (DRAPES) ×4 IMPLANT
DRAPE SRG 135X102X78XABS (DRAPES) ×2 IMPLANT
DRSG COVADERM 4X14 (GAUZE/BANDAGES/DRESSINGS) ×4 IMPLANT
ELECT CAUTERY BLADE 6.4 (BLADE) ×4 IMPLANT
ELECT REM PT RETURN 9FT ADLT (ELECTROSURGICAL) ×8
ELECTRODE REM PT RTRN 9FT ADLT (ELECTROSURGICAL) ×4 IMPLANT
FELT TEFLON 1X6 (MISCELLANEOUS) ×6 IMPLANT
GAUZE SPONGE 4X4 12PLY STRL (GAUZE/BANDAGES/DRESSINGS) ×4 IMPLANT
GAUZE SPONGE 4X4 12PLY STRL LF (GAUZE/BANDAGES/DRESSINGS) ×2 IMPLANT
GLOVE BIO SURGEON STRL SZ 6 (GLOVE) ×4 IMPLANT
GLOVE BIO SURGEON STRL SZ 6.5 (GLOVE) ×3 IMPLANT
GLOVE BIO SURGEON STRL SZ7 (GLOVE) IMPLANT
GLOVE BIO SURGEON STRL SZ7.5 (GLOVE) ×2 IMPLANT
GLOVE BIO SURGEONS STRL SZ 6.5 (GLOVE) ×3
GLOVE BIOGEL PI IND STRL 6 (GLOVE) IMPLANT
GLOVE BIOGEL PI IND STRL 6.5 (GLOVE) IMPLANT
GLOVE BIOGEL PI IND STRL 7.0 (GLOVE) IMPLANT
GLOVE BIOGEL PI INDICATOR 6 (GLOVE) ×2
GLOVE BIOGEL PI INDICATOR 6.5 (GLOVE) ×4
GLOVE BIOGEL PI INDICATOR 7.0 (GLOVE) ×6
GLOVE EUDERMIC 7 POWDERFREE (GLOVE) ×10 IMPLANT
GLOVE SURG SS PI 6.0 STRL IVOR (GLOVE) ×2 IMPLANT
GOWN STRL REUS W/ TWL LRG LVL3 (GOWN DISPOSABLE) ×8 IMPLANT
GOWN STRL REUS W/ TWL XL LVL3 (GOWN DISPOSABLE) ×2 IMPLANT
GOWN STRL REUS W/TWL LRG LVL3 (GOWN DISPOSABLE) ×20
GOWN STRL REUS W/TWL XL LVL3 (GOWN DISPOSABLE) ×4
HEMOSTAT POWDER SURGIFOAM 1G (HEMOSTASIS) ×12 IMPLANT
HEMOSTAT SURGICEL 2X14 (HEMOSTASIS) ×4 IMPLANT
KIT BASIN OR (CUSTOM PROCEDURE TRAY) ×4 IMPLANT
KIT CATH CPB BARTLE (MISCELLANEOUS) ×4 IMPLANT
KIT SUCTION CATH 14FR (SUCTIONS) ×4 IMPLANT
KIT TURNOVER KIT B (KITS) ×4 IMPLANT
LINE VENT (MISCELLANEOUS) ×4 IMPLANT
NS IRRIG 1000ML POUR BTL (IV SOLUTION) ×22 IMPLANT
PACK E OPEN HEART (SUTURE) ×4 IMPLANT
PACK OPEN HEART (CUSTOM PROCEDURE TRAY) ×4 IMPLANT
PAD ARMBOARD 7.5X6 YLW CONV (MISCELLANEOUS) ×8 IMPLANT
POSITIONER HEAD DONUT 9IN (MISCELLANEOUS) ×4 IMPLANT
SET CARDIOPLEGIA MPS 5001102 (MISCELLANEOUS) ×2 IMPLANT
STRIP CLOSURE SKIN 1/2X4 (GAUZE/BANDAGES/DRESSINGS) ×1 IMPLANT
SUT BONE WAX W31G (SUTURE) ×4 IMPLANT
SUT ETHIBON 2 0 V 52N 30 (SUTURE) IMPLANT
SUT ETHIBON EXCEL 2-0 V-5 (SUTURE) ×16 IMPLANT
SUT ETHIBOND V-5 VALVE (SUTURE) ×20 IMPLANT
SUT PROLENE 3 0 SH DA (SUTURE) IMPLANT
SUT PROLENE 3 0 SH1 36 (SUTURE) ×4 IMPLANT
SUT PROLENE 4 0 RB 1 (SUTURE) ×12
SUT PROLENE 4-0 RB1 .5 CRCL 36 (SUTURE) ×6 IMPLANT
SUT STEEL 6MS V (SUTURE) IMPLANT
SUT STEEL SZ 6 DBL 3X14 BALL (SUTURE) ×6 IMPLANT
SUT VIC AB 1 CTX 36 (SUTURE) ×8
SUT VIC AB 1 CTX36XBRD ANBCTR (SUTURE) ×4 IMPLANT
SYSTEM SAHARA CHEST DRAIN ATS (WOUND CARE) ×4 IMPLANT
Signature Latex Essential 7 glove ×2 IMPLANT
TAPE CLOTH SURG 4X10 WHT LF (GAUZE/BANDAGES/DRESSINGS) ×2 IMPLANT
TAPE PAPER 2X10 WHT MICROPORE (GAUZE/BANDAGES/DRESSINGS) ×2 IMPLANT
TOWEL GREEN STERILE (TOWEL DISPOSABLE) ×4 IMPLANT
TOWEL GREEN STERILE FF (TOWEL DISPOSABLE) ×4 IMPLANT
TRAY FOLEY SLVR 16FR TEMP STAT (SET/KITS/TRAYS/PACK) ×4 IMPLANT
UNDERPAD 30X30 (UNDERPADS AND DIAPERS) ×4 IMPLANT
VALVE AORTIC SZ21 INSP/RESIL (Valve) ×2 IMPLANT
VENT LEFT HEART 12002 (CATHETERS) ×4
WATER STERILE IRR 1000ML POUR (IV SOLUTION) ×8 IMPLANT

## 2019-03-12 NOTE — Anesthesia Procedure Notes (Signed)
Central Venous Catheter Insertion Performed by: Catalina Gravel, MD, anesthesiologist Start/End8/27/2020 6:50 AM, 03/12/2019 7:00 AM Patient location: Pre-op. Preanesthetic checklist: patient identified, IV checked, site marked, risks and benefits discussed, surgical consent, monitors and equipment checked, pre-op evaluation, timeout performed and anesthesia consent Position: Trendelenburg Lidocaine 1% used for infiltration and patient sedated Hand hygiene performed , maximum sterile barriers used  and Seldinger technique used Catheter size: 9 Fr Central line was placed.MAC introducer Procedure performed using ultrasound guided technique. Ultrasound Notes:anatomy identified, needle tip was noted to be adjacent to the nerve/plexus identified, no ultrasound evidence of intravascular and/or intraneural injection and image(s) printed for medical record Attempts: 1 Following insertion, line sutured, dressing applied and Biopatch. Post procedure assessment: free fluid flow, blood return through all ports and no air  Patient tolerated the procedure well with no immediate complications.

## 2019-03-12 NOTE — Procedures (Signed)
Extubation Procedure Note  Patient Details:   Name: Tammy Hunt DOB: 08/26/1945 MRN: [id number]   Airway Documentation:    Vent end date: 03/12/19 Vent end time: 1655   Evaluation  O2 sats: stable throughout Complications: No apparent complications Patient did tolerate procedure well. Bilateral Breath Sounds: Clear, Diminished   Yes.  Pt extubated per physician order. Pt with good cough, no stridor and able to speak name. Pt placed on 2L nasal cannula and IS started. RT will continue to monitor.   Tammy Hunt 03/12/2019, 5:02 PM

## 2019-03-12 NOTE — Progress Notes (Signed)
Pt NIF -25 and Vital Capacity 942ml prior to extubation. Dr. Cyndia Bent aware.

## 2019-03-12 NOTE — Progress Notes (Signed)
Patient ID: Tammy Hunt, female   DOB: 18-Oct-1945, 73 y.o.   MRN: [id number]  TCTS Evening Rounds:   Hemodynamically stable in sinus rhythm. CI = 1.78  Has started to wake up on vent. Weaning parameters good.  Urine output good  CT output low  CBC    Component Value Date/Time   WBC 10.4 03/12/2019 1244   RBC 3.49 (L) 03/12/2019 1244   HGB 8.5 (L) 03/12/2019 1513   HGB 13.7 02/04/2019 1000   HCT 25.0 (L) 03/12/2019 1513   HCT 40.7 02/04/2019 1000   PLT 180 03/12/2019 1244   PLT 250 02/04/2019 1000   MCV 85.1 03/12/2019 1244   MCV 85 02/04/2019 1000   MCH 28.9 03/12/2019 1244   MCHC 34.0 03/12/2019 1244   RDW 13.8 03/12/2019 1244   RDW 14.0 02/04/2019 1000   LYMPHSABS 2.1 11/28/2009 1239   MONOABS 0.6 11/28/2009 1239   EOSABS 0.2 11/28/2009 1239   BASOSABS 0.0 11/28/2009 1239     BMET    Component Value Date/Time   NA 147 (H) 03/12/2019 1513   NA 144 02/04/2019 1000   K 3.9 03/12/2019 1513   CL 107 03/10/2019 1001   CO2 24 03/10/2019 1001   GLUCOSE 103 (H) 03/12/2019 1242   BUN 14 03/10/2019 1001   BUN 13 02/04/2019 1000   CREATININE 1.10 (H) 03/10/2019 1001   CALCIUM 9.6 03/10/2019 1001   GFRNONAA 50 (L) 03/10/2019 1001   GFRAA 58 (L) 03/10/2019 1001     A/P:  Stable postop course. Continue current plans. Plan extubation when ready.

## 2019-03-12 NOTE — Transfer of Care (Signed)
Immediate Anesthesia Transfer of Care Note  Patient: OKIE KASKA  Procedure(s) Performed: AORTIC VALVE REPLACEMENT (AVR) (N/A Chest) TRANSESOPHAGEAL ECHOCARDIOGRAM (TEE) (N/A )  Patient Location: SICU  Anesthesia Type:General  Level of Consciousness: sedated  Airway & Oxygen Therapy: Patient remains intubated per anesthesia plan and Patient placed on Ventilator (see vital sign flow sheet for setting)  Post-op Assessment: Report given to RN and Post -op Vital signs reviewed and stable  Post vital signs: Reviewed and stable  Last Vitals:  Vitals Value Taken Time  BP    Temp 35.2 C 03/12/19 1239  Pulse 80 03/12/19 1239  Resp 15 03/12/19 1239  SpO2 92 % 03/12/19 1239  Vitals shown include unvalidated device data.  Last Pain:  Vitals:   03/12/19 0623  TempSrc:   PainSc: 0-No pain         Complications: No apparent anesthesia complications

## 2019-03-12 NOTE — Op Note (Signed)
CARDIOVASCULAR SURGERY OPERATIVE NOTE  03/12/2019 Tammy Hunt [id number]  Surgeon:  Gaye Pollack, MD  First Assistant: Nicholes Rough,  PA-C   Preoperative Diagnosis:  Severe aortic stenosis   Postoperative Diagnosis:  Same   Procedure:  1. Median Sternotomy 2. Extracorporeal circulation 3.   Aortic valve replacement using a 21 mm Edwards INSPIRIS RESILIA pericardial valve.  Anesthesia:  General Endotracheal   Clinical History/Surgical Indication:  This 73 year old woman has stage D, severe, symptomatic aortic stenosis with New York Heart Association class II symptoms of exertional fatigue and shortness of breath consistent with chronic diastolic congestive heart failure. I have personally reviewed her 2D echocardiogram, cardiac catheterization, and CTA studies. Echocardiogram shows a trileaflet aortic valve with moderate calcification and restricted mobility. The mean gradient is 39 mmHg and the valve area is 0.8 cm consistent with severe aortic stenosis. Cardiac catheterization shows mild nonobstructive coronary disease with normal left ventricular end-diastolic pressure. The mean gradient was 33 mmHg with a peak to peak gradient of 44 mmHg. I agree that aortic valve replacement is indicated in this patient who has developed severe aortic stenosis and is becoming symptomatic. We reviewed her CTA studies with the multidisciplinary heart valve team. She has a left main height of 10.6 mm and the RCA height of 9.5 mm which are borderline for TAVR. In addition she has relatively small sinuses and a 22 to 24 mm range. It is felt that she would be at increased risk for coronary occlusion from the native valve leaflets and that open surgery would probably be the best option for her. I reviewed all of her studies with her and her husband and the reasons that we did not feel that TAVR was the best option for her. She seems to understand that and is in agreement with open surgical aortic valve  replacement.   I discussed the operative procedure with the patient and her husband including alternatives, benefits and risks; including but not limited to bleeding, blood transfusion, infection, stroke, myocardial infarction, graft failure, heart block requiring a permanent pacemaker, organ dysfunction, and death. Tammy Hunt understands and agrees to proceed.   Preparation:  The patient was seen in the preoperative holding area and the correct patient, correct operation were confirmed with the patient after reviewing the medical record and catheterization. The consent was signed by me. Preoperative antibiotics were given. A pulmonary arterial line and radial arterial line were placed by the anesthesia team. The patient was taken back to the operating room and positioned supine on the operating room table. After being placed under general endotracheal anesthesia by the anesthesia team a foley catheter was placed. The neck, chest, abdomen, and both legs were prepped with betadine soap and solution and draped in the usual sterile manner. A surgical time-out was taken and the correct patient and operative procedure were confirmed with the nursing and anesthesia staff.   Pre-bypass TEE:   Complete TEE assessment was performed by Dr. Gifford Shave. This showed severe aortic stenosis, normal LV systolic function. Trivial MR.    Post-bypass TEE:   Normal functioning prosthetic aortic valve with no perivalvular leak or regurgitation through the valve. Left ventricular function preserved. Trivial mitral regurgitation.    Cardiopulmonary Bypass:  A median sternotomy was performed. The pericardium was opened in the midline. Right ventricular function appeared normal. The ascending aorta was of normal size and had no palpable plaque. There were no contraindications to aortic cannulation or cross-clamping. The patient was fully systemically heparinized and the  ACT was maintained > 400 sec. The proximal  aortic arch was cannulated with a 20 F aortic cannula for arterial inflow. Venous cannulation was performed via the right atrial appendage using a two-staged venous cannula. An antegrade cardioplegia/vent cannula was inserted into the mid-ascending aorta. A left ventricular vent was placed via the right superior pulmonary vein. A retrograde cardioplegia cannnula was placed into the coronary sinus via the right atrium. Aortic occlusion was performed with a single cross-clamp. Systemic cooling to 32 degrees Centigrade and topical cooling of the heart with iced saline were used. Hyperkalemic antegrade cold blood cardioplegia was used to induce diastolic arrest and then cold blood retrograde cardioplegia was given at about 20 minute intervals throughout the period of arrest to maintain myocardial temperature at or below 10 degrees centigrade. A temperature probe was inserted into the interventricular septum and an insulating pad was placed in the pericardium. Carbon dioxide was insufflated into the pericardium at 5L/min throughout the procedure to minimize intracardiac air.   Aortic Valve Replacement:   A transverse aortotomy was performed 1 cm above the take-off of the right coronary artery. The native valve was tricuspid with moderately calcified leaflets and mild annular calcification. 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  The ostia of the coronary arteries were low and the RCA ostium was close to the annulus. The sinuses were effaced. The native valve leaflets were excised and the annulus was decalcified with rongeurs. Care was taken to remove all particulate debris. The left ventricle was directly inspected for debris and then irrigated with ice saline solution. The annulus was sized and a size 21 mm INSPIRIS RESILIA pericardial valve was chosen. The model number was [id number] and the serial number was [id number]. While the valve was being  prepared 2-0 Ethibond small pledgeted horizontal mattress sutures were placed around the annulus with the pledgets in a sub-annular position. The sutures were placed through the sewing ring and the valve lowered into place. The sutures were tied sequentially. The valve seated nicely and the coronary ostia were not obstructed. The prosthetic valve leaflets moved normally and there was no sub-valvular obstruction. The aortotomy was closed using 4-0 Prolene suture in 2 layers with felt strips to reinforce the closure.  Completion:  The patient was rewarmed to 37 degrees Centigrade. De-airing maneuvers were performed and the head placed in trendelenburg position. The crossclamp was removed with a time of 106 minutes. There was spontaneous return of sinus rhythm. The aortotomy was checked for hemostasis. Two temporary epicardial pacing wires were placed on the right atrium and two on the right ventricle. The left ventricular vent and retrograde cardioplegia cannulas were removed. The patient was weaned from CPB without difficulty on no inotropes. CPB time was 129 minutes. Cardiac output was 4 LPM. Heparin was fully reversed with protamine and the aortic and venous cannulas removed. Hemostasis was achieved. Mediastinal drainage tubes were placed. The sternum was closed with double #6 stainless steel wires. The fascia was closed with continuous # 1 vicryl suture. The subcutaneous tissue was closed with 2-0 vicryl continuous suture. The skin was closed with 3-0 vicryl subcuticular suture. All sponge, needle, and instrument counts were reported correct at the end of the case. Dry sterile dressings were placed over the incisions and around the chest tubes which were connected to pleurevac suction. The patient was then transported to the surgical intensive care unit in stable condition.

## 2019-03-12 NOTE — Plan of Care (Signed)

## 2019-03-12 NOTE — Anesthesia Procedure Notes (Signed)
Procedure Name: Intubation Date/Time: 03/12/2019 7:55 AM Performed by: Mariea Clonts, CRNA Pre-anesthesia Checklist: Patient identified, Emergency Drugs available, Suction available and Patient being monitored Patient Re-evaluated:Patient Re-evaluated prior to induction Oxygen Delivery Method: Circle System Utilized Preoxygenation: Pre-oxygenation with 100% oxygen Induction Type: IV induction and Cricoid Pressure applied Ventilation: Mask ventilation without difficulty and Oral airway inserted - appropriate to patient size Laryngoscope Size: Mac and 3 Grade View: Grade II Tube type: Oral Tube size: 7.5 mm Number of attempts: 1 Airway Equipment and Method: Stylet and Oral airway Placement Confirmation: ETT inserted through vocal cords under direct vision,  positive ETCO2 and breath sounds checked- equal and bilateral Tube secured with: Tape Dental Injury: Teeth and Oropharynx as per pre-operative assessment

## 2019-03-12 NOTE — OR Nursing (Signed)
SICU 45 minutes call @1121 .  Second call 92minutes @1151 

## 2019-03-12 NOTE — Brief Op Note (Signed)
03/12/2019  1:51 PM  PATIENT:  Tammy Hunt  73 y.o. female  PRE-OPERATIVE DIAGNOSIS:  SEVERE AS  POST-OPERATIVE DIAGNOSIS:  SEVERE AS  PROCEDURE:  Procedure(s): AORTIC VALVE REPLACEMENT (AVR) (N/A) TRANSESOPHAGEAL ECHOCARDIOGRAM (TEE) (N/A)  SURGEON:  Surgeon(s) and Role:    * Bartle, Fernande Boyden, MD - Primary  PHYSICIAN ASSISTANT:  Nicholes Rough, PA-C   ANESTHESIA:   general  EBL:  600 mL   BLOOD ADMINISTERED:none  DRAINS: routine   LOCAL MEDICATIONS USED:  NONE  SPECIMEN:  Source of Specimen:  aortic valve leaflets  DISPOSITION OF SPECIMEN:  PATHOLOGY  COUNTS:  YES  DICTATION: .Dragon Dictation  PLAN OF CARE: Admit to inpatient   PATIENT DISPOSITION:  ICU - intubated and hemodynamically stable.   Delay start of Pharmacological VTE agent (>24hrs) due to surgical blood loss or risk of bleeding: yes

## 2019-03-12 NOTE — Progress Notes (Signed)
Repeat ABG readings called to Dr. Cyndia Bent. PH 7.29, pCO2 44.9, pO2 101, HCO3 22.3, TCO2 24, Sat 97, Be -4. MD ordered nurse to give another amp of Bicarb and repeat ABG in an hour. Will carry out orders. Will continue to monitor patient.

## 2019-03-12 NOTE — Anesthesia Procedure Notes (Signed)
Central Venous Catheter Insertion Performed by: Catalina Gravel, MD, anesthesiologist Start/End8/27/2020 7:00 AM, 03/12/2019 7:05 AM Patient location: Pre-op. Preanesthetic checklist: patient identified, IV checked, site marked, risks and benefits discussed, surgical consent, monitors and equipment checked, pre-op evaluation and timeout performed Position: Trendelenburg Hand hygiene performed  and maximum sterile barriers used  Total catheter length 100. PA cath was placed.Swan type:thermodilution PA Cath depth:48 Procedure performed without using ultrasound guided technique. Attempts: 1 Patient tolerated the procedure well with no immediate complications.

## 2019-03-12 NOTE — Progress Notes (Signed)
Patient extubated @ [id number] with no complications. Patient Alert and Oriented x4. IS started post extubation with patient pulling up to 10000. Dr. Cyndia Bent at bedside and patient extubated per MD order.

## 2019-03-12 NOTE — Anesthesia Procedure Notes (Signed)
Arterial Line Insertion Start/End8/27/2020 7:00 AM, 03/12/2019 7:05 AM Performed by: CRNA  Patient location: Pre-op. Preanesthetic checklist: patient identified, IV checked, site marked, risks and benefits discussed, surgical consent, monitors and equipment checked, pre-op evaluation, timeout performed and anesthesia consent Lidocaine 1% used for infiltration Left, radial was placed Catheter size: 20 G Hand hygiene performed  and maximum sterile barriers used   Attempts: 1 Procedure performed without using ultrasound guided technique. Following insertion, Biopatch and dressing applied. Post procedure assessment: normal  Patient tolerated the procedure well with no immediate complications.

## 2019-03-12 NOTE — Progress Notes (Signed)
ABG upon arrival to ICU post op taken and readings called to Dr. Cyndia Bent. PH 7.27, pCO2 48.1, pO2 69, HCO3 22.8, Sat 93, TCO2 24, Be -4. MD ordered nurse to give 1 amp of Bicarb, change Rate on Vent to 16 and repeat ABG in an hour. Orders will be carried out. Will continue to monitor.

## 2019-03-12 NOTE — Interval H&P Note (Signed)
History and Physical Interval Note:  03/12/2019 7:10 AM  Tammy Hunt  has presented today for surgery, with the diagnosis of SEVERE AS.  The various methods of treatment have been discussed with the patient and family. After consideration of risks, benefits and other options for treatment, the patient has consented to  Procedure(s): AORTIC VALVE REPLACEMENT (AVR) (N/A) TRANSESOPHAGEAL ECHOCARDIOGRAM (TEE) (N/A) as a surgical intervention.  The patient's history has been reviewed, patient examined, no change in status, stable for surgery.  I have reviewed the patient's chart and labs.  Questions were answered to the patient's satisfaction.     Gaye Pollack

## 2019-03-13 ENCOUNTER — Encounter (HOSPITAL_COMMUNITY): Payer: Self-pay | Admitting: Surgery

## 2019-03-13 ENCOUNTER — Inpatient Hospital Stay (HOSPITAL_COMMUNITY): Payer: Medicare Other

## 2019-03-13 LAB — POCT I-STAT 7, (LYTES, BLD GAS, ICA,H+H)
Acid-Base Excess: 2 mmol/L (ref 0.0–2.0)
Acid-base deficit: 1 mmol/L (ref 0.0–2.0)
Bicarbonate: 25.7 mmol/L (ref 20.0–28.0)
Bicarbonate: 24.3 mmol/L (ref 20.0–28.0)
Bicarbonate: 26.3 mmol/L (ref 20.0–28.0)
Bicarbonate: 23.7 mmol/L (ref 20.0–28.0)
Calcium, Ion: 1.29 mmol/L (ref 1.15–1.40)
Calcium, Ion: 0.93 mmol/L — ABNORMAL LOW (ref 1.15–1.40)
Calcium, Ion: 1.22 mmol/L (ref 1.15–1.40)
Calcium, Ion: 1.07 mmol/L — ABNORMAL LOW (ref 1.15–1.40)
HCT: 37 % (ref 36.0–46.0)
HCT: 26 % — ABNORMAL LOW (ref 36.0–46.0)
HCT: 33 % — ABNORMAL LOW (ref 36.0–46.0)
HCT: 27 % — ABNORMAL LOW (ref 36.0–46.0)
Hemoglobin: 12.6 g/dL (ref 12.0–15.0)
Hemoglobin: 11.2 g/dL — ABNORMAL LOW (ref 12.0–15.0)
Hemoglobin: 8.8 g/dL — ABNORMAL LOW (ref 12.0–15.0)
Hemoglobin: 9.2 g/dL — ABNORMAL LOW (ref 12.0–15.0)
O2 Saturation: 100 %
O2 Saturation: 100 %
O2 Saturation: 100 %
O2 Saturation: 100 %
Potassium: 3.8 mmol/L (ref 3.5–5.1)
Potassium: 3.7 mmol/L (ref 3.5–5.1)
Potassium: 3.8 mmol/L (ref 3.5–5.1)
Potassium: 4 mmol/L (ref 3.5–5.1)
Sodium: 144 mmol/L (ref 135–145)
Sodium: 142 mmol/L (ref 135–145)
Sodium: 145 mmol/L (ref 135–145)
Sodium: 143 mmol/L (ref 135–145)
TCO2: 27 mmol/L (ref 22–32)
TCO2: 26 mmol/L (ref 22–32)
TCO2: 28 mmol/L (ref 22–32)
TCO2: 25 mmol/L (ref 22–32)
pCO2 arterial: 47.3 mmHg (ref 32.0–48.0)
pCO2 arterial: 40.2 mmHg (ref 32.0–48.0)
pCO2 arterial: 42.1 mmHg (ref 32.0–48.0)
pCO2 arterial: 35 mmHg (ref 32.0–48.0)
pH, Arterial: 7.343 — ABNORMAL LOW (ref 7.350–7.450)
pH, Arterial: 7.369 (ref 7.350–7.450)
pH, Arterial: 7.424 (ref 7.350–7.450)
pH, Arterial: 7.439 (ref 7.350–7.450)
pO2, Arterial: 330 mmHg — ABNORMAL HIGH (ref 83.0–108.0)
pO2, Arterial: 224 mmHg — ABNORMAL HIGH (ref 83.0–108.0)
pO2, Arterial: 349 mmHg — ABNORMAL HIGH (ref 83.0–108.0)
pO2, Arterial: 170 mmHg — ABNORMAL HIGH (ref 83.0–108.0)

## 2019-03-13 LAB — POCT I-STAT 4, (NA,K, GLUC, HGB,HCT)
Glucose, Bld: 112 mg/dL — ABNORMAL HIGH (ref 70–99)
Glucose, Bld: 105 mg/dL — ABNORMAL HIGH (ref 70–99)
Glucose, Bld: 90 mg/dL (ref 70–99)
Glucose, Bld: 138 mg/dL — ABNORMAL HIGH (ref 70–99)
Glucose, Bld: 151 mg/dL — ABNORMAL HIGH (ref 70–99)
HCT: 37 % (ref 36.0–46.0)
HCT: 25 % — ABNORMAL LOW (ref 36.0–46.0)
HCT: 32 % — ABNORMAL LOW (ref 36.0–46.0)
HCT: 27 % — ABNORMAL LOW (ref 36.0–46.0)
HCT: 26 % — ABNORMAL LOW (ref 36.0–46.0)
Hemoglobin: 12.6 g/dL (ref 12.0–15.0)
Hemoglobin: 10.9 g/dL — ABNORMAL LOW (ref 12.0–15.0)
Hemoglobin: 8.5 g/dL — ABNORMAL LOW (ref 12.0–15.0)
Hemoglobin: 9.2 g/dL — ABNORMAL LOW (ref 12.0–15.0)
Hemoglobin: 8.8 g/dL — ABNORMAL LOW (ref 12.0–15.0)
Potassium: 3.8 mmol/L (ref 3.5–5.1)
Potassium: 3.6 mmol/L (ref 3.5–5.1)
Potassium: 3.7 mmol/L (ref 3.5–5.1)
Potassium: 4.1 mmol/L (ref 3.5–5.1)
Potassium: 3.9 mmol/L (ref 3.5–5.1)
Sodium: 142 mmol/L (ref 135–145)
Sodium: 143 mmol/L (ref 135–145)
Sodium: 143 mmol/L (ref 135–145)
Sodium: 140 mmol/L (ref 135–145)
Sodium: 142 mmol/L (ref 135–145)

## 2019-03-13 LAB — BASIC METABOLIC PANEL
Anion gap: 8 (ref 5–15)
Anion gap: 10 (ref 5–15)
BUN: 10 mg/dL (ref 8–23)
BUN: 11 mg/dL (ref 8–23)
CO2: 20 mmol/L — ABNORMAL LOW (ref 22–32)
CO2: 21 mmol/L — ABNORMAL LOW (ref 22–32)
Calcium: 7.7 mg/dL — ABNORMAL LOW (ref 8.9–10.3)
Calcium: 8.3 mg/dL — ABNORMAL LOW (ref 8.9–10.3)
Chloride: 111 mmol/L (ref 98–111)
Chloride: 107 mmol/L (ref 98–111)
Creatinine, Ser: 0.85 mg/dL (ref 0.44–1.00)
Creatinine, Ser: 0.98 mg/dL (ref 0.44–1.00)
GFR calc Af Amer: >60 mL/min (ref >60)
GFR calc Af Amer: >60 mL/min (ref >60)
GFR calc non Af Amer: >60 mL/min (ref >60)
GFR calc non Af Amer: 58 mL/min — ABNORMAL LOW (ref >60)
Glucose, Bld: 132 mg/dL — ABNORMAL HIGH (ref 70–99)
Glucose, Bld: 208 mg/dL — ABNORMAL HIGH (ref 70–99)
Potassium: 3.7 mmol/L (ref 3.5–5.1)
Potassium: 3.9 mmol/L (ref 3.5–5.1)
Sodium: 139 mmol/L (ref 135–145)
Sodium: 138 mmol/L (ref 135–145)

## 2019-03-13 LAB — CBC
HCT: 29.2 % — ABNORMAL LOW (ref 36.0–46.0)
HCT: 27.9 % — ABNORMAL LOW (ref 36.0–46.0)
HCT: 29.1 % — ABNORMAL LOW (ref 36.0–46.0)
Hemoglobin: 9.7 g/dL — ABNORMAL LOW (ref 12.0–15.0)
Hemoglobin: 9.3 g/dL — ABNORMAL LOW (ref 12.0–15.0)
Hemoglobin: 9.5 g/dL — ABNORMAL LOW (ref 12.0–15.0)
MCH: 28.5 pg (ref 26.0–34.0)
MCH: 28.6 pg (ref 26.0–34.0)
MCH: 28.6 pg (ref 26.0–34.0)
MCHC: 33.2 g/dL (ref 30.0–36.0)
MCHC: 33.3 g/dL (ref 30.0–36.0)
MCHC: 32.6 g/dL (ref 30.0–36.0)
MCV: 85.9 fL (ref 80.0–100.0)
MCV: 85.8 fL (ref 80.0–100.0)
MCV: 87.7 fL (ref 80.0–100.0)
Platelets: 187 10*3/uL (ref 150–400)
Platelets: 174 10*3/uL (ref 150–400)
Platelets: 194 10*3/uL (ref 150–400)
RBC: 3.4 MIL/uL — ABNORMAL LOW (ref 3.87–5.11)
RBC: 3.25 MIL/uL — ABNORMAL LOW (ref 3.87–5.11)
RBC: 3.32 MIL/uL — ABNORMAL LOW (ref 3.87–5.11)
RDW: 14 % (ref 11.5–15.5)
RDW: 14.2 % (ref 11.5–15.5)
RDW: 14.4 % (ref 11.5–15.5)
WBC: 11.7 10*3/uL — ABNORMAL HIGH (ref 4.0–10.5)
WBC: 12 10*3/uL — ABNORMAL HIGH (ref 4.0–10.5)
WBC: 12.9 10*3/uL — ABNORMAL HIGH (ref 4.0–10.5)
nRBC: 0 % (ref 0.0–0.2)
nRBC: 0 % (ref 0.0–0.2)
nRBC: 0 % (ref 0.0–0.2)

## 2019-03-13 LAB — GLUCOSE, CAPILLARY
Glucose-Capillary: 110 mg/dL — ABNORMAL HIGH (ref 70–99)
Glucose-Capillary: 135 mg/dL — ABNORMAL HIGH (ref 70–99)
Glucose-Capillary: 77 mg/dL (ref 70–99)
Glucose-Capillary: 91 mg/dL (ref 70–99)
Glucose-Capillary: 96 mg/dL (ref 70–99)
Glucose-Capillary: 132 mg/dL — ABNORMAL HIGH (ref 70–99)
Glucose-Capillary: 108 mg/dL — ABNORMAL HIGH (ref 70–99)
Glucose-Capillary: 107 mg/dL — ABNORMAL HIGH (ref 70–99)
Glucose-Capillary: 134 mg/dL — ABNORMAL HIGH (ref 70–99)
Glucose-Capillary: 130 mg/dL — ABNORMAL HIGH (ref 70–99)

## 2019-03-13 LAB — MAGNESIUM
Magnesium: 2.3 mg/dL (ref 1.7–2.4)
Magnesium: 2.1 mg/dL (ref 1.7–2.4)

## 2019-03-13 LAB — ECHO INTRAOPERATIVE TEE
Height: 59 in
Weight: 3558.4 oz

## 2019-03-13 LAB — CREATININE, SERUM
Creatinine, Ser: 0.93 mg/dL (ref 0.44–1.00)
GFR calc Af Amer: >60 mL/min (ref >60)
GFR calc non Af Amer: >60 mL/min (ref >60)

## 2019-03-13 MED ORDER — FUROSEMIDE 40 MG PO TABS
40.0000 mg | ORAL_TABLET | Freq: Every day | ORAL | Status: AC
  Administered 2019-03-13 – 2019-03-15 (×3): 40 mg via ORAL
  Filled 2019-03-13 (×3): qty 1

## 2019-03-13 MED ORDER — INSULIN ASPART 100 UNIT/ML ~~LOC~~ SOLN
0.0000 [IU] | SUBCUTANEOUS | Status: DC
  Administered 2019-03-13: 04:00:00 2 [IU] via SUBCUTANEOUS

## 2019-03-13 MED ORDER — POTASSIUM CHLORIDE 10 MEQ/50ML IV SOLN
10.0000 meq | INTRAVENOUS | Status: AC
  Administered 2019-03-13 (×3): 10 meq via INTRAVENOUS
  Filled 2019-03-13 (×3): qty 50

## 2019-03-13 MED ORDER — TRAMADOL HCL 50 MG PO TABS: 50.0000 mg | ORAL_TABLET | ORAL | Status: DC | PRN

## 2019-03-13 MED ORDER — INSULIN ASPART 100 UNIT/ML ~~LOC~~ SOLN: 0.0000 [IU] | Freq: Three times a day (TID) | SUBCUTANEOUS | Status: DC

## 2019-03-13 MED ORDER — POTASSIUM CHLORIDE CRYS ER 20 MEQ PO TBCR
20.0000 meq | EXTENDED_RELEASE_TABLET | Freq: Two times a day (BID) | ORAL | Status: AC
  Administered 2019-03-13 – 2019-03-14 (×4): 20 meq via ORAL
  Filled 2019-03-13 (×4): qty 1

## 2019-03-13 MED ORDER — FUROSEMIDE 10 MG/ML IJ SOLN
40.0000 mg | Freq: Once | INTRAMUSCULAR | Status: AC
  Administered 2019-03-13: 40 mg via INTRAVENOUS
  Filled 2019-03-13: qty 4

## 2019-03-13 MED ORDER — PNEUMOCOCCAL VAC POLYVALENT 25 MCG/0.5ML IJ INJ: 0.5000 mL | INJECTION | INTRAMUSCULAR | Status: DC

## 2019-03-13 MED ORDER — ENOXAPARIN SODIUM 40 MG/0.4ML ~~LOC~~ SOLN
40.0000 mg | Freq: Every day | SUBCUTANEOUS | Status: DC
  Administered 2019-03-13 – 2019-03-16 (×4): 40 mg via SUBCUTANEOUS
  Filled 2019-03-13 (×4): qty 0.4

## 2019-03-13 MED FILL — Magnesium Sulfate Inj 50%: INTRAMUSCULAR | Qty: 10 | Status: AC

## 2019-03-13 MED FILL — Heparin Sodium (Porcine) Inj 1000 Unit/ML: INTRAMUSCULAR | Qty: 30 | Status: AC

## 2019-03-13 MED FILL — Potassium Chloride Inj 2 mEq/ML: INTRAVENOUS | Qty: 40 | Status: AC

## 2019-03-13 NOTE — Anesthesia Postprocedure Evaluation (Signed)
Anesthesia Post Note  Patient: Tammy Hunt  Procedure(s) Performed: AORTIC VALVE REPLACEMENT (AVR) (N/A Chest) TRANSESOPHAGEAL ECHOCARDIOGRAM (TEE) (N/A )     Patient location during evaluation: SICU Anesthesia Type: General Level of consciousness: sedated Pain management: pain level controlled Vital Signs Assessment: post-procedure vital signs reviewed and stable Respiratory status: patient remains intubated per anesthesia plan Cardiovascular status: stable Postop Assessment: no apparent nausea or vomiting Anesthetic complications: no    Last Vitals:  Vitals:   03/13/19 1900 03/13/19 1941  BP: (!) 108/53   Pulse: 80   Resp: 14   Temp:  36.8 C  SpO2: 92%     Last Pain:  Vitals:   03/13/19 1941  TempSrc: Oral  PainSc:                  Catalina Gravel

## 2019-03-13 NOTE — Discharge Summary (Signed)
Tammy Hunt       [place] [address]             [phone]      Physician Discharge Summary  Patient ID: Tammy Hunt MRN: [id number] DOB/AGE: Sep 01, 1945 73 y.o.  Admit date: 03/12/2019 Discharge date: 03/17/2019  Admission Diagnoses:  Patient Active Problem List   Diagnosis Date Noted   Intramural leiomyoma of uterus 04/27/2015   Hyperlipidemia 02/16/2015   Non-rheumatic mitral regurgitation 02/16/2015   Nonrheumatic aortic valve insufficiency 02/16/2015   Nonrheumatic aortic valve stenosis 02/16/2015   Endometrial polyp 01/14/2015   Hypothyroidism 12/21/2014   H/O vitamin D deficiency 12/21/2014   Parathyroid abnormality ([place]) 07/22/2013   Multinodular goiter 07/22/2013   Family history of thyroid disease 11/12/2012   Vaginal atrophy 11/12/2012   Elevated cholesterol    Osteopenia    Aortic insufficiency    Mitral valve regurgitation    Depression    Diverticulitis    Papilloma of breast     Discharge Diagnoses:  Active Problems:   S/P AVR   Discharged Condition: good  HPI:   The patient is a 73 year old woman with a history of hyperlipidemia, hypothyroidism, and aortic stenosis with aortic insufficiency who has been followed by Dr. Bettina Hunt with serial echocardiograms. August 2016 she had a gradient of 18 mmHg and then 03/2017 her gradient increased to 28 mmHg. Her most recent echocardiogram on 01/15/2019 showed an increase in the mean gradient to 39 mmHg with a peak gradient of 61 mmHg and an aortic valve area of 0.84 cm. Left ventricular systolic function was normal with ejection fraction of 60 to 65%. There is mild aortic insufficiency. Cardiac catheterization on 02/13/2019 showed mild nonobstructive coronary disease involving the proximal LAD and right coronary arteries. The mean gradient across aortic valve was 33 mmHg with a peak gradient of 44 mmHg.  The patient is still relatively active and goes to the [facility] doing  aquatics. She has noticed development of progressive exertional fatigue and shortness of breath. She denies any dizziness or syncope. She has had no chest pain or pressure. She has had no orthopnea. She denies any peripheral edema  Hospital Course:   Tammy Hunt underwent an aortic valve replacement with Dr. Cyndia Hunt on 03/12/2019.  She tolerated the procedure well and was transferred to the surgical ICU.  She was extubated in a timely manner. POD 1 she remains hemodynamically stable and in NSR.  We started a low-dose beta-blocker to better control her heart rate and blood pressure.  We discontinued her Swan-Ganz catheter, arterial line, and chest tubes.  We discontinued her blood glucose checks since she is not a diabetic and they were under good control.  We encouraged use of incentive spirometry hourly and ambulation around the unit. She was transferred to the step down unit on 8/30. POD 4 she was tolerating room air. Her EPW were removed since her rhythm has been stable. Cardiac rehab worked with her yesterday. Today, she is ambulating with limited assistance, her incisions are healing well, she is tolerating room air, and she is ready for discharge home.   Consults: None  Significant Diagnostic Studies:   CLINICAL DATA:  Status post aortic valve repair.  EXAM: PORTABLE CHEST 1 VIEW  COMPARISON:  Radiograph of March 12, 2019.  FINDINGS: Stable cardiomediastinal silhouette. Aortic valve prosthesis is noted. Endotracheal and nasogastric tubes have been removed. Right internal jugular catheter Swan-Ganz catheter is noted with tip directed toward right pulmonary  artery. No pneumothorax is noted. Right lung is clear. Mild left basilar subsegmental atelectasis is noted with minimal left pleural effusion. Bony thorax is unremarkable.  IMPRESSION: Endotracheal and nasogastric tubes have been removed. Mild left basilar subsegmental atelectasis is noted with minimal left  pleural effusion.   Electronically Signed   By: Marijo Conception M.D.   On: 03/13/2019 08:50  Treatments:   CARDIOVASCULAR SURGERY OPERATIVE NOTE  03/12/2019 Tammy Hunt [id number]  Surgeon:  Gaye Pollack, MD  First Assistant: Nicholes Rough,  PA-C   Preoperative Diagnosis:  Severe aortic stenosis   Postoperative Diagnosis:  Same   Procedure:  1. Median Sternotomy 2. Extracorporeal circulation 3.   Aortic valve replacement using a 21 mm Edwards INSPIRIS RESILIA pericardial valve.  Anesthesia:  General Endotracheal   Clinical History/Surgical Indication:  This 73 year old woman has stage D, severe, symptomatic aortic stenosis with New York Heart Association class II symptoms of exertional fatigue and shortness of breath consistent with chronic diastolic congestive heart failure. I have personally reviewed her 2D echocardiogram, cardiac catheterization, and CTA studies. Echocardiogram shows a trileaflet aortic valve with moderate calcification and restricted mobility. The mean gradient is 39 mmHg and the valve area is 0.8 cmconsistent with severe aortic stenosis. Cardiac catheterization shows mild nonobstructive coronary disease with normal left ventricular end-diastolic pressure. The mean gradient was 33 mmHg with a peak to peak gradient of 44 mmHg. I agree that aortic valve replacement is indicated in this patient who has developed severe aortic stenosis and is becoming symptomatic. We reviewed her CTA studies with the multidisciplinary heart valve team. She has a left main height of 10.6 mm and the RCA height of 9.5 mm which are borderline for TAVR. In addition she has relatively small sinuses and a 22 to 24 mm range. It is felt that she would be at increased risk for coronary occlusion from the native valve leaflets and that open surgery would probably be the best option for her. I reviewed all of her studies with her and her husband and the reasons that we  did not feel that TAVR was the best option for her. She seems to understand that and is in agreement with open surgical aortic valve replacement.   I discussed the operative procedure with the patient and her husband including alternatives, benefits and risks; including but not limited to bleeding, blood transfusion, infection, stroke, myocardial infarction, graft failure, heart block requiring a permanent pacemaker, organ dysfunction, and death. Jerel Shepherd understands and agrees to proceed.   Discharge Exam: Blood pressure 123/60, pulse 78, temperature 98.1 F (36.7 C), temperature source Oral, resp. rate 16, height 4\' 11"  (1.499 m), weight 101.8 kg, SpO2 97 %.   General appearance: alert, cooperative and no distress Heart: regular rate and rhythm, S1, S2 normal, no murmur, click, rub or gallop Lungs: clear to auscultation bilaterally Abdomen: soft, non-tender; bowel sounds normal; no masses,  no organomegaly Extremities: extremities normal, atraumatic, no cyanosis or edema Wound: clean and dry  Disposition: Discharge disposition: 01-Home or Self Care       Discharge Instructions    Amb Referral to Cardiac Rehabilitation   Complete by: As directed    Referring to [place] CRP 2   Diagnosis: Valve Replacement   Valve: Aortic   After initial evaluation and assessments completed: Virtual Based Care may be provided alone or in conjunction with Phase 2 Cardiac Rehab based on patient barriers.: Yes     Allergies as of 03/17/2019  No Known Allergies     Medication List    STOP taking these medications   aspirin 81 MG tablet Replaced by: aspirin 325 MG EC tablet   ibuprofen 200 MG tablet Commonly known as: ADVIL   NON FORMULARY     TAKE these medications   acetaminophen 500 MG tablet Commonly known as: TYLENOL Take 2 tablets (1,000 mg total) by mouth every 6 (six) hours.   aspirin 325 MG EC tablet Take 1 tablet (325 mg total) by mouth daily. Replaces: aspirin 81 MG  tablet   buPROPion 300 MG 24 hr tablet Commonly known as: WELLBUTRIN XL Take 300 mg by mouth daily.   CALCIUM 600/VITAMIN D PO Take 1 tablet by mouth daily.   clonazePAM 0.5 MG tablet Commonly known as: KLONOPIN Take 0.5 mg by mouth daily as needed for anxiety.   Fish Oil 1000 MG Caps Take 2,000 mg by mouth 2 (two) times daily.   levothyroxine 75 MCG tablet Commonly known as: SYNTHROID Take 1 tablet (75 mcg total) by mouth daily before breakfast.   Magnesium 500 MG Tabs Take 500 mg by mouth daily.   metoprolol tartrate 25 MG tablet Commonly known as: LOPRESSOR Take 0.5 tablets (12.5 mg total) by mouth 2 (two) times daily. What changed:   medication strength  how much to take  how to take this  when to take this  additional instructions   oxyCODONE 5 MG immediate release tablet Commonly known as: Oxy IR/ROXICODONE Take 1 tablet (5 mg total) by mouth every 6 (six) hours as needed for severe pain.   polyethylene glycol 17 g packet Commonly known as: MIRALAX / GLYCOLAX Take 17 g by mouth daily.   pravastatin 40 MG tablet Commonly known as: PRAVACHOL Take 40 mg by mouth daily.   Vitamin D (Cholecalciferol) 50 MCG (2000 UT) Caps Take 2,000 Units by mouth 2 (two) times a day.      Follow-up Information    Gaye Pollack, MD. Go on 04/08/2019.   Specialty: Cardiothoracic Surgery Why: You have an appointment with Dr. Cyndia Hunt on Wednesday 04/08/19 at 3:00pm. Please arrive 30 minutes early for a chest xray to be performed by [facility] located on the first floor of the same building.  Contact information: [address] [place] [address] [phone]        Redding, John F. II, MD. Call in 1 day(s).   Specialty: Family Medicine Contact information: [place] [address] [phone]        Loel Dubonnet, NP Follow up.   Specialty: Cardiology Why: Your appointment is on 03/27/2019 at 9:00am. Please bring  your medication list. Contact information: [place] [address] [phone]        nursing suture removal Follow up.   Why: Your suture removal appointment is on 03/24/2019 at 10:00am.  Contact information: Dr. Vivi Martens office         The patient has been discharged on:   1.Beta Blocker:  Yes [ yes  ]                              No   [   ]                              If No, reason:  2.Ace Inhibitor/ARB: Yes [   ]  No  [  no  ]                                     If No, reason: BP well controlled  3.Statin:   Yes [ yes  ]                  No  [   ]                  If No, reason:  4.Ecasa:  Yes  [ yes  ]                  No   [   ]                  If No, reason:   Signed: Elgie Collard 03/17/2019, 8:12 AM

## 2019-03-13 NOTE — Progress Notes (Signed)
      [address]       [place] [address]             [phone]      POD # 1 AVR  Minimal response to PO lasix  BP (!) 108/41   Pulse 93   Temp 99.1 F (37.3 C) (Axillary)   Resp 17   Ht 4\' 11"  (1.499 m)   Wt 103 kg   SpO2 97%   BMI 45.86 kg/m   Intake/Output Summary (Last 24 hours) at 03/13/2019 1735 Last data filed at 03/13/2019 1720 Gross per 24 hour  Intake 1942.19 ml  Output 1195 ml  Net 747.19 ml   Only 300 ml of UO since lasix- will give one dose IV PM labs pending  Tammy Lipps C. Roxan Hockey, MD [facility] [phone]

## 2019-03-13 NOTE — Progress Notes (Signed)
1 Day Post-Op Procedure(s) (LRB): AORTIC VALVE REPLACEMENT (AVR) (N/A) TRANSESOPHAGEAL ECHOCARDIOGRAM (TEE) (N/A) Subjective: No complaints  Objective: Vital signs in last 24 hours: Temp:  [95.4 F (35.2 C)-98.8 F (37.1 C)] 98.4 F (36.9 C) (08/28 0700) Pulse Rate:  [76-88] 80 (08/28 0700) Cardiac Rhythm: Normal sinus rhythm (08/28 0000) Resp:  [9-29] 17 (08/28 0700) BP: (97-136)/(53-76) 97/57 (08/28 0700) SpO2:  [91 %-100 %] 93 % (08/28 0700) Arterial Line BP: (90-138)/(48-74) 125/48 (08/28 0700) FiO2 (%):  [40 %-50 %] 40 % (08/27 1600) Weight:  [103 kg] 103 kg (08/28 0600)  Hemodynamic parameters for last 24 hours: PAP: (25-37)/(13-24) 35/15 CO:  [2.6 L/min-4.4 L/min] 3.9 L/min CI:  [1.4 L/min/m2-2.3 L/min/m2] 2 L/min/m2  Intake/Output from previous day: 08/27 0701 - 08/28 0700 In: 4886.9 [I.V.:3027.4; Blood:300; IV Piggyback:1559.5] Out: 3450 [Urine:2600; Blood:600; Chest Tube:250] Intake/Output this shift: No intake/output data recorded.  General appearance: alert and cooperative Neurologic: intact Heart: regular rate and rhythm, S1, S2 normal, no murmur, click, rub or gallop Lungs: clear to auscultation bilaterally Extremities: edema mild Wound: dressing dry  Lab Results: Recent Labs    03/12/19 1810 03/13/19 0410  WBC 11.9* 11.7*  HGB 10.4* 9.7*  HCT 30.5* 29.2*  PLT 177 187   BMET:  Recent Labs    03/12/19 1810 03/13/19 0410  NA 141 139  K 3.8 3.7  CL 110 111  CO2 23 20*  GLUCOSE 145* 132*  BUN 11 10  CREATININE 0.87 0.85  CALCIUM 7.9* 7.7*    PT/INR:  Recent Labs    03/12/19 1244  LABPROT 18.5*  INR 1.6*   ABG    Component Value Date/Time   PHART 7.376 03/12/2019 1803   HCO3 23.7 03/12/2019 1803   TCO2 25 03/12/2019 1808   ACIDBASEDEF 2.0 03/12/2019 1803   O2SAT 99.0 03/12/2019 1803   CBG (last 3)  Recent Labs    03/13/19 0105 03/13/19 0206 03/13/19 0416  GLUCAP 77 91 132*   CXR: mild left base atelectasis  ECG: sinus,  no acute changes.  Assessment/Plan: S/P Procedure(s) (LRB): AORTIC VALVE REPLACEMENT (AVR) (N/A) TRANSESOPHAGEAL ECHOCARDIOGRAM (TEE) (N/A)  POD 1 Hemodynamically stable in sinus rhythm. Continue low dose Lopressor.  Continue ASA for bioprosthetic valve.  DC swan, arterial line, chest tubes.  DC CBG's since glucose under good control and Hgb A1c was 5.5 preop.  IS, ambulation.     LOS: 1 day    Gaye Pollack 03/13/2019

## 2019-03-13 NOTE — Plan of Care (Signed)
  Problem: Clinical Measurements: Goal: Ability to maintain clinical measurements within normal limits will improve Outcome: Progressing Goal: Respiratory complications will improve Outcome: Progressing Goal: Cardiovascular complication will be avoided Outcome: Progressing   Problem: Pain Managment: Goal: General experience of comfort will improve Outcome: Progressing   Problem: Safety: Goal: Ability to remain free from injury will improve Outcome: Progressing   

## 2019-03-14 ENCOUNTER — Inpatient Hospital Stay (HOSPITAL_COMMUNITY): Payer: Medicare Other

## 2019-03-14 LAB — CBC
HCT: 26 % — ABNORMAL LOW (ref 36.0–46.0)
Hemoglobin: 8.6 g/dL — ABNORMAL LOW (ref 12.0–15.0)
MCH: 28.9 pg (ref 26.0–34.0)
MCHC: 33.1 g/dL (ref 30.0–36.0)
MCV: 87.2 fL (ref 80.0–100.0)
Platelets: 176 10*3/uL (ref 150–400)
RBC: 2.98 MIL/uL — ABNORMAL LOW (ref 3.87–5.11)
RDW: 14.4 % (ref 11.5–15.5)
WBC: 10.3 10*3/uL (ref 4.0–10.5)
nRBC: 0 % (ref 0.0–0.2)

## 2019-03-14 LAB — BASIC METABOLIC PANEL
Anion gap: 8 (ref 5–15)
BUN: 10 mg/dL (ref 8–23)
CO2: 24 mmol/L (ref 22–32)
Calcium: 8.1 mg/dL — ABNORMAL LOW (ref 8.9–10.3)
Chloride: 106 mmol/L (ref 98–111)
Creatinine, Ser: 0.86 mg/dL (ref 0.44–1.00)
GFR calc Af Amer: >60 mL/min (ref >60)
GFR calc non Af Amer: >60 mL/min (ref >60)
Glucose, Bld: 97 mg/dL (ref 70–99)
Potassium: 3.6 mmol/L (ref 3.5–5.1)
Sodium: 138 mmol/L (ref 135–145)

## 2019-03-14 MED ORDER — POLYETHYLENE GLYCOL 3350 17 G PO PACK: 17.0000 g | PACK | Freq: Every day | ORAL | Status: DC | PRN

## 2019-03-14 MED ORDER — SODIUM CHLORIDE 0.9 % IV SOLN: 250.0000 mL | INTRAVENOUS | Status: DC | PRN

## 2019-03-14 MED ORDER — MAGNESIUM HYDROXIDE 400 MG/5ML PO SUSP: 30.0000 mL | Freq: Every day | ORAL | Status: DC | PRN

## 2019-03-14 MED ORDER — MAGNESIUM OXIDE 400 (241.3 MG) MG PO TABS
400.0000 mg | ORAL_TABLET | Freq: Every day | ORAL | Status: DC
  Administered 2019-03-14 – 2019-03-17 (×4): 400 mg via ORAL
  Filled 2019-03-14 (×5): qty 1

## 2019-03-14 MED ORDER — SODIUM CHLORIDE 0.9% FLUSH: 3.0000 mL | INTRAVENOUS | Status: DC | PRN

## 2019-03-14 MED ORDER — ZOLPIDEM TARTRATE 5 MG PO TABS
5.0000 mg | ORAL_TABLET | Freq: Every evening | ORAL | Status: DC | PRN
  Filled 2019-03-14: qty 1

## 2019-03-14 MED ORDER — MOVING RIGHT ALONG BOOK
Freq: Once | Status: DC
  Filled 2019-03-14: qty 1

## 2019-03-14 MED ORDER — SODIUM CHLORIDE 0.9% FLUSH
3.0000 mL | Freq: Two times a day (BID) | INTRAVENOUS | Status: DC
  Administered 2019-03-14 – 2019-03-16 (×5): 3 mL via INTRAVENOUS

## 2019-03-14 MED ORDER — POTASSIUM CHLORIDE 10 MEQ/50ML IV SOLN
10.0000 meq | INTRAVENOUS | Status: AC
  Administered 2019-03-14 (×3): 10 meq via INTRAVENOUS
  Filled 2019-03-14: qty 50

## 2019-03-14 MED ORDER — MAGNESIUM 500 MG PO TABS: 500.0000 mg | ORAL_TABLET | Freq: Every day | ORAL | Status: DC

## 2019-03-14 NOTE — Progress Notes (Addendum)
2 Days Post-Op Procedure(s) (LRB): AORTIC VALVE REPLACEMENT (AVR) (N/A) TRANSESOPHAGEAL ECHOCARDIOGRAM (TEE) (N/A) Subjective: No complaints  Objective: Vital signs in last 24 hours: Temp:  [98 F (36.7 C)-99.1 F (37.3 C)] 98.2 F (36.8 C) (08/29 0800) Pulse Rate:  [76-106] 92 (08/29 0930) Cardiac Rhythm: Normal sinus rhythm;Sinus tachycardia (08/29 0806) Resp:  [12-26] 21 (08/29 0930) BP: (90-132)/(41-70) 129/67 (08/29 0930) SpO2:  [86 %-97 %] 89 % (08/29 0930) Weight:  [104 kg] 104 kg (08/29 0500)  Hemodynamic parameters for last 24 hours:    Intake/Output from previous day: 08/28 0701 - 08/29 0700 In: 1485.4 [P.O.:520; I.V.:565.4; IV Piggyback:400] Out: 2190 [Urine:2140; Chest Tube:50] Intake/Output this shift: Total I/O In: 207.2 [P.O.:200; I.V.:7.2] Out: 85 [Urine:85]  General appearance: alert, cooperative and no distress Neurologic: intact Heart: regular rate and rhythm Lungs: diminished breath sounds bibasilar Abdomen: normal findings: soft, non-tender  Lab Results: Recent Labs    03/13/19 1703 03/14/19 0356  WBC 12.9* 10.3  HGB 9.5* 8.6*  HCT 29.1* 26.0*  PLT 194 176   BMET:  Recent Labs    03/13/19 1703 03/14/19 0356  NA 138 138  K 3.9 3.6  CL 107 106  CO2 21* 24  GLUCOSE 208* 97  BUN 11 10  CREATININE 0.98 0.86  CALCIUM 8.3* 8.1*    PT/INR:  Recent Labs    03/12/19 1244  LABPROT 18.5*  INR 1.6*   ABG    Component Value Date/Time   PHART 7.376 03/12/2019 1803   HCO3 23.7 03/12/2019 1803   TCO2 25 03/12/2019 1808   ACIDBASEDEF 2.0 03/12/2019 1803   O2SAT 99.0 03/12/2019 1803   CBG (last 3)  Recent Labs    03/13/19 1120 03/13/19 1537 03/13/19 1950  GLUCAP 107* 134* 130*    Assessment/Plan: S/P Procedure(s) (LRB): AORTIC VALVE REPLACEMENT (AVR) (N/A) TRANSESOPHAGEAL ECHOCARDIOGRAM (TEE) (N/A) Doing well POD # 2  CV- s/p AVR, stable in SR RESP- IS for atelectasis RENAL- creatinine normal , lytes Ok  K supplemented  per protocol ENDO- CBG well controlled Continue cardiac rehab   LOS: 2 days    Melrose Nakayama 03/14/2019

## 2019-03-14 NOTE — Progress Notes (Signed)
Report given to 4E RN Sharyn Lull. Will transdfer by wheelchair. VSS.  Lucius Conn, RN

## 2019-03-15 ENCOUNTER — Inpatient Hospital Stay (HOSPITAL_COMMUNITY): Payer: Medicare Other

## 2019-03-15 LAB — CBC
HCT: 25.7 % — ABNORMAL LOW (ref 36.0–46.0)
Hemoglobin: 8.7 g/dL — ABNORMAL LOW (ref 12.0–15.0)
MCH: 28.8 pg (ref 26.0–34.0)
MCHC: 33.9 g/dL (ref 30.0–36.0)
MCV: 85.1 fL (ref 80.0–100.0)
Platelets: 163 10*3/uL (ref 150–400)
RBC: 3.02 MIL/uL — ABNORMAL LOW (ref 3.87–5.11)
RDW: 14.3 % (ref 11.5–15.5)
WBC: 7.6 10*3/uL (ref 4.0–10.5)
nRBC: 0 % (ref 0.0–0.2)

## 2019-03-15 LAB — BASIC METABOLIC PANEL
Anion gap: 7 (ref 5–15)
BUN: 10 mg/dL (ref 8–23)
CO2: 27 mmol/L (ref 22–32)
Calcium: 8.6 mg/dL — ABNORMAL LOW (ref 8.9–10.3)
Chloride: 108 mmol/L (ref 98–111)
Creatinine, Ser: 0.86 mg/dL (ref 0.44–1.00)
GFR calc Af Amer: >60 mL/min (ref >60)
GFR calc non Af Amer: >60 mL/min (ref >60)
Glucose, Bld: 98 mg/dL (ref 70–99)
Potassium: 4.1 mmol/L (ref 3.5–5.1)
Sodium: 142 mmol/L (ref 135–145)

## 2019-03-15 NOTE — Progress Notes (Signed)
3 Days Post-Op Procedure(s) (LRB): AORTIC VALVE REPLACEMENT (AVR) (N/A) TRANSESOPHAGEAL ECHOCARDIOGRAM (TEE) (N/A) Subjective: No complaints  Objective: Vital signs in last 24 hours: Temp:  [97.9 F (36.6 C)-98.3 F (36.8 C)] 98.3 F (36.8 C) (08/30 0427) Pulse Rate:  [77-98] 91 (08/30 0902) Cardiac Rhythm: Normal sinus rhythm (08/30 0700) Resp:  [14-18] 17 (08/30 0434) BP: (91-122)/(49-73) 121/56 (08/30 0902) SpO2:  [93 %-96 %] 94 % (08/30 0427) Weight:  [102.7 kg] 102.7 kg (08/30 0434)  Hemodynamic parameters for last 24 hours:    Intake/Output from previous day: 08/29 0701 - 08/30 0700 In: 447.2 [P.O.:440; I.V.:7.2] Out: 935 [Urine:935] Intake/Output this shift: Total I/O In: 150 [P.O.:150] Out: -   General appearance: alert, cooperative and no distress Neurologic: intact Heart: regular rate and rhythm Lungs: diminished breath sounds bibasilar Wound: clean and dry  Lab Results: Recent Labs    03/14/19 0356 03/15/19 0305  WBC 10.3 7.6  HGB 8.6* 8.7*  HCT 26.0* 25.7*  PLT 176 163   BMET:  Recent Labs    03/14/19 0356 03/15/19 0305  NA 138 142  K 3.6 4.1  CL 106 108  CO2 24 27  GLUCOSE 97 98  BUN 10 10  CREATININE 0.86 0.86  CALCIUM 8.1* 8.6*    PT/INR:  Recent Labs    03/12/19 1244  LABPROT 18.5*  INR 1.6*   ABG    Component Value Date/Time   PHART 7.376 03/12/2019 1803   HCO3 23.7 03/12/2019 1803   TCO2 25 03/12/2019 1808   ACIDBASEDEF 2.0 03/12/2019 1803   O2SAT 99.0 03/12/2019 1803   CBG (last 3)  Recent Labs    03/13/19 1120 03/13/19 1537 03/13/19 1950  GLUCAP 107* 134* 130*    Assessment/Plan: S/P Procedure(s) (LRB): AORTIC VALVE REPLACEMENT (AVR) (N/A) TRANSESOPHAGEAL ECHOCARDIOGRAM (TEE) (N/A) Doing well  CV- stable in SR RESP- minimal effusions RENAL- creatinine and lytes OK ENDO- CBG well controlled Cardiac rehab   LOS: 3 days    Melrose Nakayama 03/15/2019

## 2019-03-16 MED ORDER — POLYETHYLENE GLYCOL 3350 17 G PO PACK
17.0000 g | PACK | Freq: Every day | ORAL | Status: DC
  Administered 2019-03-16 – 2019-03-17 (×2): 17 g via ORAL
  Filled 2019-03-16: qty 1

## 2019-03-16 NOTE — Progress Notes (Addendum)
      [address]       [place] [address]             [phone]      4 Days Post-Op Procedure(s) (LRB): AORTIC VALVE REPLACEMENT (AVR) (N/A) TRANSESOPHAGEAL ECHOCARDIOGRAM (TEE) (N/A) Subjective: Feels okay. Worried about bowels. Still feels fatigued.  Objective: Vital signs in last 24 hours: Temp:  [97.8 F (36.6 C)-98 F (36.7 C)] 97.8 F (36.6 C) (08/31 0442) Pulse Rate:  [79-92] 79 (08/31 0444) Cardiac Rhythm: Normal sinus rhythm (08/30 1925) Resp:  [16-23] 16 (08/31 0444) BP: (111-124)/(56-67) 124/67 (08/31 0444) SpO2:  [94 %-97 %] 95 % (08/31 0444) Weight:  [102.2 kg] 102.2 kg (08/31 0444)     Intake/Output from previous day: 08/30 0701 - 08/31 0700 In: 350 [P.O.:350] Out: -  Intake/Output this shift: No intake/output data recorded.  General appearance: alert, cooperative and no distress Heart: regular rate and rhythm, S1, S2 normal, no murmur, click, rub or gallop Lungs: clear to auscultation bilaterally Abdomen: soft, non-tender; bowel sounds normal; no masses,  no organomegaly Extremities: extremities normal, atraumatic, no cyanosis or edema Wound: clean and dry  Lab Results: Recent Labs    03/14/19 0356 03/15/19 0305  WBC 10.3 7.6  HGB 8.6* 8.7*  HCT 26.0* 25.7*  PLT 176 163   BMET:  Recent Labs    03/14/19 0356 03/15/19 0305  NA 138 142  K 3.6 4.1  CL 106 108  CO2 24 27  GLUCOSE 97 98  BUN 10 10  CREATININE 0.86 0.86  CALCIUM 8.1* 8.6*    PT/INR: No results for input(s): LABPROT, INR in the last 72 hours. ABG    Component Value Date/Time   PHART 7.376 03/12/2019 1803   HCO3 23.7 03/12/2019 1803   TCO2 25 03/12/2019 1808   ACIDBASEDEF 2.0 03/12/2019 1803   O2SAT 99.0 03/12/2019 1803   CBG (last 3)  Recent Labs    03/13/19 1120 03/13/19 1537 03/13/19 1950  GLUCAP 107* 134* 130*    Assessment/Plan: S/P Procedure(s) (LRB): AORTIC VALVE REPLACEMENT (AVR) (N/A) TRANSESOPHAGEAL ECHOCARDIOGRAM (TEE) (N/A)   1. CV-NSR in the 70s-80s. BP well controlled. Continue ASA, statin, and BB.  2. Pulm-tolerating room air with good oxygen saturation 3. Renal-creatinine 0.86, electrolytes okay. Mag replacement ordered. Lasix last dose yesterday. Weight continues to trend down.  4. H and H 8.7/25.7, expected acute blood loss anemia 5. Endo-blood glucose well controlled.   Plan: Discontinue EPW since rhythm has been stable. Changed miralax to daily- hx of bowel surgery. Will need a BM before discharge. Will order PT since no one has worked with her for mobility.    LOS: 4 days    Elgie Collard 03/16/2019   Chart reviewed, patient examined, agree with above. Feels well. Had BM today. VS have been stable and maintaining sinus rhythm without arrhyhmias. I think she can go home tomorrow if no changes.

## 2019-03-16 NOTE — Progress Notes (Signed)
CARDIAC REHAB PHASE I   PRE:  Rate/Rhythm: 76 SR  BP:  Supine:   Sitting: 128/63  Standing:    SaO2: 95%RA  MODE:  Ambulation: 370 ft   POST:  Rate/Rhythm: 107 ST  BP:  Supine:   Sitting: 118/67  Standing:    SaO2: 96%RA 0906-0945 Pt walked 370 ft on RA with rolling walker and minimal asst. Stopped once to rest. To recliner after walk. Pt asking about CRP 2. Discussed and referred to [place] CRP 2. Pt encouraged to drink liquids as she has not ordered breakfast yet due to feeling constipated. Pt going to order breakfast and eat before pacing wire removal. Pt stated her goal is to get back to water aquatics. Discussed staying in the tube and that walking is ex for now until sternum healed and then she can discuss water ex with surgeon.   Graylon Good, RN BSN  03/16/2019 9:41 AM

## 2019-03-16 NOTE — Addendum Note (Signed)
Addendum  created 03/16/19 0951 by Josephine Igo, CRNA   Order list changed

## 2019-03-16 NOTE — Care Management Important Message (Signed)
Important Message  Patient Details  Name: Tammy Hunt MRN: [id number] Date of Birth: 1945-08-26   Medicare Important Message Given:  Yes     Shelda Altes 03/16/2019, 1:46 PM

## 2019-03-16 NOTE — Progress Notes (Signed)
EPWs removed per order.  Pt tolerated well.  Sites unremarkable, all tips intact.  CCMD notified, will monitor closely

## 2019-03-17 MED ORDER — ASPIRIN 325 MG PO TBEC: 325.0000 mg | DELAYED_RELEASE_TABLET | Freq: Every day | ORAL | 0 refills | Status: DC

## 2019-03-17 MED ORDER — ACETAMINOPHEN 500 MG PO TABS: 1000.0000 mg | ORAL_TABLET | Freq: Four times a day (QID) | ORAL | 0 refills | Status: DC

## 2019-03-17 MED ORDER — OXYCODONE HCL 5 MG PO TABS: 5.0000 mg | ORAL_TABLET | Freq: Four times a day (QID) | ORAL | 0 refills | Status: DC | PRN

## 2019-03-17 MED ORDER — METOPROLOL TARTRATE 25 MG PO TABS: 12.5000 mg | ORAL_TABLET | Freq: Two times a day (BID) | ORAL | 1 refills | Status: DC

## 2019-03-17 MED FILL — Heparin Sodium (Porcine) Inj 1000 Unit/ML: INTRAMUSCULAR | Qty: 10 | Status: AC

## 2019-03-17 MED FILL — Mannitol IV Soln 20%: INTRAVENOUS | Qty: 500 | Status: AC

## 2019-03-17 MED FILL — Lidocaine HCl Local Soln Prefilled Syringe 100 MG/5ML (2%): INTRAMUSCULAR | Qty: 5 | Status: AC

## 2019-03-17 MED FILL — Sodium Bicarbonate IV Soln 8.4%: INTRAVENOUS | Qty: 50 | Status: AC

## 2019-03-17 MED FILL — Sodium Chloride IV Soln 0.9%: INTRAVENOUS | Qty: 2000 | Status: AC

## 2019-03-17 MED FILL — Electrolyte-R (PH 7.4) Solution: INTRAVENOUS | Qty: 4000 | Status: AC

## 2019-03-17 NOTE — Progress Notes (Signed)
0915-1000 Pt seen by PT. Discussed with pt importance of IS, walking for ex, heart healthy food choices, CRP 2 and staying in the tube. Referring to [place] CRP 2. Pt voiced understanding of ed. Graylon Good RN BSN 03/17/2019 10:00 AM

## 2019-03-17 NOTE — Discharge Instructions (Signed)
Discharge Instructions:  1. You may shower, please wash incisions daily with soap and water and keep dry.  If you wish to cover wounds with dressing you may do so but please keep clean and change daily.  No tub baths or swimming until incisions have completely healed.  If your incisions become red or develop any drainage please call our office at [phone]  2. No Driving until cleared by our office and you are no longer using narcotic pain medications  3. Monitor your weight daily.. Please use the same scale and weigh at same time... If you gain 3-5 lbs in 48 hours with associated lower extremity swelling, please contact our office at [phone]  4. Fever of 101.5 for at least 24 hours, please contact our office at [phone]. If you have sternal drainage which is pus-like please call for an appointment.   5. Activity- up as tolerated, please walk at least 3 times per day.  Avoid strenuous activity, no lifting, pushing, or pulling with your arms over 8-10 lbs for a minimum of 6 weeks  6. If any questions or concerns arise, please do not hesitate to contact our office at [phone]

## 2019-03-17 NOTE — Evaluation (Addendum)
Physical Therapy Evaluation and Discharge Patient Details Name: Tammy Hunt MRN: [id number] DOB: May 21, 1946 Today's Date: 03/17/2019   History of Present Illness  73 year old woman with a history of osteopenia, hyperlipidemia, hypothyroidism, and aortic stenosis with aortic insufficiency; presented 03/12/19 with the diagnosis of SEVERE AS for AVR via median sternotomy. 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  Clinical Impression   Patient evaluated by Physical Therapy with no further acute PT needs identified. All education has been completed and the patient has no further questions. Patient asking excellent questions and reviewed potential need for tub bench and how to use. Reviewed that insurance does not cover these. She plans to look into ordering one from [employer]. See below for any follow-up Physical Therapy or equipment needs. PT is signing off. Thank you for this referral.     Follow Up Recommendations      Equipment Recommendations       Recommendations for Other Services       Precautions / Restrictions Precautions Precautions: Sternal;Fall Precaution Booklet Issued: No Restrictions Other Position/Activity Restrictions: sternal precautions      Mobility  Bed Mobility               General bed mobility comments: pt plans to sleep in her recliner due to her bed is too high  Transfers Overall transfer level: Modified independent Equipment used: None             General transfer comment: steadied herself with bed rail; from chair with no UE support  Ambulation/Gait Ambulation/Gait assistance: Independent Gait Distance (Feet): 15 Feet(25) Assistive device: None Gait Pattern/deviations: Step-through pattern;Decreased stride length;Wide base of support     General Gait Details: educated pt on proper use of walking stick (simulated) for when she does walk outside as she reports very unlevel ground at her home. She does not  plan to walk outside regularly yet due to heat and unlevel ground.   Stairs Stairs: (Reviewed safer entrance for her to use (front) and safe tech)          Wheelchair Mobility    Modified Rankin (Stroke Patients Only)       Balance Overall balance assessment: Modified Independent                               Standardized Balance Assessment Standardized Balance Assessment : Berg Balance Test Berg Balance Test Sit to Stand: Able to stand without using hands and stabilize independently Standing Unsupported: Able to stand safely 2 minutes Sitting with Back Unsupported but Feet Supported on Floor or Stool: Able to sit safely and securely 2 minutes Stand to Sit: Sits safely with minimal use of hands Transfers: Able to transfer safely, minor use of hands Standing Unsupported with Eyes Closed: Able to stand 10 seconds safely Standing Ubsupported with Feet Together: Able to place feet together independently and stand 1 minute safely From Standing, Reach Forward with Outstretched Arm: Can reach forward >12 cm safely (5") From Standing Position, Pick up Object from Floor: Able to pick up shoe, needs supervision From Standing Position, Turn to Look Behind Over each Shoulder: Looks behind from both sides and weight shifts well Turn 360 Degrees: Able to turn 360 degrees safely but slowly Standing on One Leg: Able to lift leg independently and hold equal to or more than 3 seconds         Pertinent Vitals/Pain Pain Assessment:  No/denies pain    Home Living Family/patient expects to be discharged to:: Private residence Living Arrangements: Spouse/significant other Available Help at Discharge: Family;Available 24 hours/day Type of Home: House Home Access: Stairs to enter(front ) Entrance Stairs-Rails: Right Entrance Stairs-Number of Steps: 4+1 Home Layout: Two level Home Equipment: Walker - 2 wheels;Walker - 4 wheels;Grab bars - tub/shower(can borrow walkers; walking  stick ) Additional Comments: windowsill is right beside tub; has suction     Prior Function Level of Independence: Independent         Comments: denies falls, but would stagger; Worked on an [employer], [employer] in [employer] and could feel off-balance when she would leave this room     Hand Dominance        Extremity/Trunk Assessment   Upper Extremity Assessment Upper Extremity Assessment: Overall WFL for tasks assessed(within sternal precautions)    Lower Extremity Assessment Lower Extremity Assessment: Overall WFL for tasks assessed    Cervical / Trunk Assessment Cervical / Trunk Assessment: Other exceptions Cervical / Trunk Exceptions: obese  Communication   Communication: No difficulties  Cognition Arousal/Alertness: Awake/alert Behavior During Therapy: WFL for tasks assessed/performed Overall Cognitive Status: Within Functional Limits for tasks assessed                                        General Comments General comments (skin integrity, edema, etc.): Standing eyes closed, feet together 10 sec no imbalance; denies numbness, tingling, burning in feet    Exercises Other Exercises Other Exercises: educated in proper use of IS and proper frequency; pt return demo'd technique and pulled 1250 ml Other Exercises: single leg standing with light UE support for balance training   Assessment/Plan    PT Assessment Patent does not need any further PT services  PT Problem List         PT Treatment Interventions      PT Goals (Current goals can be found in the Care Plan section)  Acute Rehab PT Goals PT Goal Formulation: All assessment and education complete, DC therapy    Frequency     Barriers to discharge        Co-evaluation               AM-PAC PT "6 Clicks" Mobility  Outcome Measure Help needed turning from your back to your side while in a flat bed without using bedrails?: None Help needed moving from lying on your back to sitting on  the side of a flat bed without using bedrails?: A Little Help needed moving to and from a bed to a chair (including a wheelchair)?: None Help needed standing up from a chair using your arms (e.g., wheelchair or bedside chair)?: None Help needed to walk in hospital room?: None Help needed climbing 3-5 steps with a railing? : A Little 6 Click Score: 22    End of Session   Activity Tolerance: Patient tolerated treatment well Patient left: in chair;with call bell/phone within reach   PT Visit Diagnosis: Difficulty in walking, not elsewhere classified (R26.2)    Time: [id number] PT Time Calculation (min) (ACUTE ONLY): 44 min   Charges:   PT Evaluation $PT Eval Low Complexity: 1 Low PT Treatments $Therapeutic Activity: 8-22 mins $Self Care/Home Management: 8-22          Barry Brunner, PT      Tammy Hunt 03/17/2019, 9:30 AM

## 2019-03-17 NOTE — Progress Notes (Addendum)
      [address]       [place] [address]             [phone]      5 Days Post-Op Procedure(s) (LRB): AORTIC VALVE REPLACEMENT (AVR) (N/A) TRANSESOPHAGEAL ECHOCARDIOGRAM (TEE) (N/A) Subjective: Feels good this morning. Ready for discharge home.  Objective: Vital signs in last 24 hours: Temp:  [97.6 F (36.4 C)-98.1 F (36.7 C)] 98.1 F (36.7 C) (09/01 0500) Pulse Rate:  [71-78] 78 (09/01 0500) Cardiac Rhythm: Normal sinus rhythm (08/31 1902) Resp:  [12-18] 16 (09/01 0500) BP: (102-158)/(47-78) 123/60 (09/01 0500) SpO2:  [94 %-97 %] 97 % (09/01 0500) Weight:  [101.8 kg] 101.8 kg (09/01 0500)     Intake/Output from previous day: 08/31 0701 - 09/01 0700 In: 240 [P.O.:240] Out: -  Intake/Output this shift: No intake/output data recorded.  General appearance: alert, cooperative and no distress Heart: regular rate and rhythm, S1, S2 normal, no murmur, click, rub or gallop Lungs: clear to auscultation bilaterally Abdomen: soft, non-tender; bowel sounds normal; no masses,  no organomegaly Extremities: extremities normal, atraumatic, no cyanosis or edema Wound: clean and dry  Lab Results: Recent Labs    03/15/19 0305  WBC 7.6  HGB 8.7*  HCT 25.7*  PLT 163   BMET:  Recent Labs    03/15/19 0305  NA 142  K 4.1  CL 108  CO2 27  GLUCOSE 98  BUN 10  CREATININE 0.86  CALCIUM 8.6*    PT/INR: No results for input(s): LABPROT, INR in the last 72 hours. ABG    Component Value Date/Time   PHART 7.376 03/12/2019 1803   HCO3 23.7 03/12/2019 1803   TCO2 25 03/12/2019 1808   ACIDBASEDEF 2.0 03/12/2019 1803   O2SAT 99.0 03/12/2019 1803   CBG (last 3)  No results for input(s): GLUCAP in the last 72 hours.  Assessment/Plan: S/P Procedure(s) (LRB): AORTIC VALVE REPLACEMENT (AVR) (N/A) TRANSESOPHAGEAL ECHOCARDIOGRAM (TEE) (N/A)  1. CV-NSR in the 70s-80s. BP well controlled. Continue ASA, statin, and BB.  2. Pulm-tolerating room air with  good oxygen saturation 3. Renal-creatinine 0.86, electrolytes okay. Mag replacement ordered. Lasix last dose yesterday. Weight continues to trend down.  4. H and H 8.7/25.7, expected acute blood loss anemia 5. Endo-blood glucose well controlled.   Plan: Ready for discharge today. No medication changes. Getting around much better today. She does not think she will need a walker at home. Her husband will be available to help.    LOS: 5 days    Elgie Collard 03/17/2019   Chart reviewed, patient examined, agree with above. She feels well and wants to go home this am.

## 2019-03-17 NOTE — Progress Notes (Signed)
Pt DC'd home with husband.  All instructions given and reviewed, all questions answered.   

## 2019-03-24 ENCOUNTER — Other Ambulatory Visit: Payer: Self-pay

## 2019-03-24 ENCOUNTER — Encounter (INDEPENDENT_AMBULATORY_CARE_PROVIDER_SITE_OTHER): Payer: Self-pay

## 2019-03-24 DIAGNOSIS — Z4802 Encounter for removal of sutures: Secondary | ICD-10-CM

## 2019-03-24 NOTE — Telephone Encounter (Signed)
Dr. Bettina Gavia attempted to contact patient twice more with no answer.

## 2019-03-25 ENCOUNTER — Encounter: Payer: Self-pay | Admitting: Family

## 2019-03-25 DIAGNOSIS — F419 Anxiety disorder, unspecified: Secondary | ICD-10-CM | POA: Insufficient documentation

## 2019-03-25 DIAGNOSIS — I2510 Atherosclerotic heart disease of native coronary artery without angina pectoris: Secondary | ICD-10-CM | POA: Insufficient documentation

## 2019-03-25 DIAGNOSIS — I350 Nonrheumatic aortic (valve) stenosis: Secondary | ICD-10-CM | POA: Insufficient documentation

## 2019-03-25 DIAGNOSIS — M1990 Unspecified osteoarthritis, unspecified site: Secondary | ICD-10-CM | POA: Insufficient documentation

## 2019-03-27 ENCOUNTER — Encounter: Payer: Self-pay | Admitting: Family

## 2019-03-27 ENCOUNTER — Other Ambulatory Visit: Payer: Self-pay

## 2019-03-27 ENCOUNTER — Ambulatory Visit (INDEPENDENT_AMBULATORY_CARE_PROVIDER_SITE_OTHER): Payer: Medicare Other | Admitting: Family

## 2019-03-27 ENCOUNTER — Ambulatory Visit: Payer: Medicare Other | Admitting: Family

## 2019-03-27 VITALS — BP 136/74 | HR 63 | Ht 59.0 in | Wt 220.1 lb

## 2019-03-27 DIAGNOSIS — I2510 Atherosclerotic heart disease of native coronary artery without angina pectoris: Secondary | ICD-10-CM | POA: Diagnosis not present

## 2019-03-27 DIAGNOSIS — Z952 Presence of prosthetic heart valve: Secondary | ICD-10-CM

## 2019-03-27 DIAGNOSIS — R0609 Other forms of dyspnea: Secondary | ICD-10-CM

## 2019-03-27 DIAGNOSIS — D649 Anemia, unspecified: Secondary | ICD-10-CM

## 2019-03-27 DIAGNOSIS — E782 Mixed hyperlipidemia: Secondary | ICD-10-CM

## 2019-03-27 NOTE — Patient Instructions (Signed)
Medication Instructions:   START children's iron tablet daily.   If after one week no signs of constipation, change to twice daily.   If you need a refill on your cardiac medications before your next appointment, please call your pharmacy.   Lab work: Your physician recommends that you return for lab work today: BMET, CBC  If you have labs (blood work) drawn today and your tests are completely normal, you will receive your results only by: Marland Kitchen MyChart Message (if you have MyChart) OR . A paper copy in the mail If you have any lab test that is abnormal or we need to change your treatment, we will call you to review the results.  Testing/Procedures: You had an EKG today.   Follow-Up: At [facility] you and your health needs are our priority.  As part of our continuing mission to provide you with exceptional heart care, we have created designated Provider Care Teams.  These Care Teams include your primary Cardiologist (physician) and Advanced Practice Providers (APPs -  Physician Assistants and Nurse Practitioners) who all work together to provide you with the care you need, when you need it. You will need a follow up appointment in 3 months. You may see Shirlee More, MD or another member of our [facility] Provider Team in [place]: Jenne Campus, MD . Jyl Heinz, MD  Any Other Special Instructions Will Be Listed Below (If Applicable).   Please check your blood pressure daily and keep a log. If your systolic blood pressure (the top number) is consistently >130 please call our office. Send a report of your blood pressures in 2 weeks.   Your hemoglobin was low in the hospital likely due to surgery. We will check it today. Below are iron rich foods.    Iron-Rich Diet  Iron is a mineral that helps your body to produce hemoglobin. Hemoglobin is a protein in red blood cells that carries oxygen to your body's tissues. Eating too little iron may cause you to feel weak and tired,  and it can increase your risk of infection. Iron is naturally found in many foods, and many foods have iron added to them (iron-fortified foods). You may need to follow an iron-rich diet if you do not have enough iron in your body due to certain medical conditions. The amount of iron that you need each day depends on your age, your sex, and any medical conditions you have. Follow instructions from your health care provider or a diet and nutrition specialist (dietitian) about how much iron you should eat each day. What are tips for following this plan? Reading food labels  Check food labels to see how many milligrams (mg) of iron are in each serving. Cooking  Cook foods in pots and pans that are made from iron.  Take these steps to make it easier for your body to absorb iron from certain foods: ? Soak beans overnight before cooking. ? Soak whole grains overnight and drain them before using. ? Ferment flours before baking, such as by using yeast in bread dough. Meal planning  When you eat foods that contain iron, you should eat them with foods that are high in vitamin C. These include oranges, peppers, tomatoes, potatoes, and mango. Vitamin C helps your body to absorb iron. General information  Take iron supplements only as told by your health care provider. An overdose of iron can be life-threatening. If you were prescribed iron supplements, take them with orange juice or a vitamin C supplement.  When you eat iron-fortified foods or take an iron supplement, you should also eat foods that naturally contain iron, such as meat, poultry, and fish. Eating naturally iron-rich foods helps your body to absorb the iron that is added to other foods or contained in a supplement.  Certain foods and drinks prevent your body from absorbing iron properly. Avoid eating these foods in the same meal as iron-rich foods or with iron supplements. These foods include: ? Coffee, black tea, and red wine. ? Milk,  dairy products, and foods that are high in calcium. ? Beans and soybeans. ? Whole grains. What foods should I eat? Fruits Prunes. Raisins. Eat fruits high in vitamin C, such as oranges, grapefruits, and strawberries, alongside iron-rich foods. Vegetables Spinach (cooked). Green peas. Broccoli. Fermented vegetables. Eat vegetables high in vitamin C, such as leafy greens, potatoes, bell peppers, and tomatoes, alongside iron-rich foods. Grains Iron-fortified breakfast cereal. Iron-fortified whole-wheat bread. Enriched rice. Sprouted grains. Meats and other proteins Beef liver. Oysters. Beef. Shrimp. [place]. Chicken. [place]. Sardines. Chickpeas. Nuts. Tofu. Pumpkin seeds. Beverages Tomato juice. Fresh orange juice. Prune juice. Hibiscus tea. Fortified instant breakfast shakes. Sweets and desserts Blackstrap molasses. Seasonings and condiments Tahini. Fermented soy sauce. Other foods Wheat germ. The items listed above may not be a complete list of recommended foods and beverages. Contact a dietitian for more information. What foods should I avoid? Grains Whole grains. Bran cereal. Bran flour. Oats. Meats and other proteins Soybeans. Products made from soy protein. Black beans. Lentils. Mung beans. Split peas. Dairy Milk. Cream. Cheese. Yogurt. Cottage cheese. Beverages Coffee. Black tea. Red wine. Sweets and desserts Cocoa. Chocolate. Ice cream. Other foods Basil. Oregano. Large amounts of parsley. The items listed above may not be a complete list of foods and beverages to avoid. Contact a dietitian for more information. Summary  Iron is a mineral that helps your body to produce hemoglobin. Hemoglobin is a protein in red blood cells that carries oxygen to your body's tissues.  Iron is naturally found in many foods, and many foods have iron added to them (iron-fortified foods).  When you eat foods that contain iron, you should eat them with foods that are high in vitamin C. Vitamin  C helps your body to absorb iron.  Certain foods and drinks prevent your body from absorbing iron properly, such as whole grains and dairy products. You should avoid eating these foods in the same meal as iron-rich foods or with iron supplements. This information is not intended to replace advice given to you by your health care provider. Make sure you discuss any questions you have with your health care provider. Document Released: 02/13/2005 Document Revised: 06/14/2017 Document Reviewed: 05/28/2017 Elsevier Patient Education  2020 [employer].

## 2019-03-27 NOTE — Progress Notes (Signed)
Office Visit    Patient Name: Tammy Hunt Date of Encounter: 03/29/2019  Primary Care Provider:  Angelina Sheriff, MD Primary Cardiologist:  Shirlee More, MD Electrophysiologist:  None   Chief Complaint    Tammy Hunt is a 73 y.o. female with a hx of severe AS and AI s/p AVR, HLD, hypothyroidism presents today for follow up after SAVR.   Past Medical History    Past Medical History:  Diagnosis Date  . Anxiety   . Arthritis   . Depression   . Diverticulitis   . Heart murmur   . Hyperlipidemia   . Mild nonobstructive coronary artery disease    (a) 02/13/19 cath with 40% stenosis to LAD and RCA - mild nonobstructive disease rec for medical mgmt  . Mitral valve regurgitation   . Osteopenia   . Palpitations   . Papilloma of breast   . S/P AVR (aortic valve replacement) 03/12/2019   (a) SAVR with 16mm [place] percardial valve  . Severe aortic stenosis    s/p SAVR 03/12/19  . Thyroid disease    Past Surgical History:  Procedure Laterality Date  . AORTIC VALVE REPLACEMENT N/A 03/12/2019   Procedure: AORTIC VALVE REPLACEMENT (AVR);  Surgeon: Gaye Pollack, MD;  Location: [place];  Service: Open Heart Surgery;  Laterality: N/A;  . BIOPSY THYROID  JAN 2015  . BOWEL RESECTION    . BREAST SURGERY     Left breast  . CESAREAN SECTION    . CHOLECYSTECTOMY    . COLONOSCOPY  07/26/2014   Colonic polyps statuas post polypectomy. Mild neo diverticulosis. External hemorrhoids.   . COLOSTOMY TAKEDOWN    . CORONARY ANGIOPLASTY    . FINGER SURGERY    . LEFT HEART CATH AND CORONARY ANGIOGRAPHY N/A 02/13/2019   Procedure: LEFT HEART CATH AND CORONARY ANGIOGRAPHY;  Surgeon: Sherren Mocha, MD;  Location: [place] [facility];  Service: Cardiovascular;  Laterality: N/A;  . TEE WITHOUT CARDIOVERSION N/A 03/12/2019   Procedure: TRANSESOPHAGEAL ECHOCARDIOGRAM (TEE);  Surgeon: Gaye Pollack, MD;  Location: [place];  Service: Open Heart Surgery;  Laterality: N/A;  . TUBAL  LIGATION      Allergies  No Known Allergies  History of Present Illness    Tammy Hunt is a 73 y.o. female with a hx of hypothyroidism, HLD, AS and AI s/p AVR (69mm [place] INSPIRIS RESILIA). She presents today for follow up after AVR.   Last seen by Dr. Bettina Gavia 01/30/19 after echocardiogram showed severe AS which was symptomatic. She underwent cardiac catheterization on 02/13/19 by Dr. Burt Knack. Evaluation by multidisciplinary valve team determined open surgery was the best option. Underwent SAVR on 03/12/19 with Dr. Cyndia Bent. Her post operative course was unremarkable.  Labs 03/15/2019 were notable for creatinine 0.86, GFR greater than 60, hemoglobin 8.7. She was discharged home on 03/17/19.   Reports feeling overall well since discharge.  Tells me she has been doing 10-minute walks around her home 3 times per day as it is still too warm to walk outside. She reports no chest pain since discharge from the hospital.  Reports she still has some dyspnea on exertion that was present prior to surgery and she was hoping this would go away completely with the valve replacement.  Encouraged her to be patient is healing as a process.  She does tell me her DOE and fatigue have been improving each day.   Plans to attend orientation with cardiac rehab at [facility] on 03/31/19.   EKGs/Labs/Other Studies Reviewed:   The following studies were reviewed today:  Echo 01/15/2019  1. The left ventricle has normal systolic function with an ejection fraction of 60-65%. The cavity size was normal. There is moderately increased left ventricular wall thickness. Left ventricular diastolic Doppler parameters are consistent with impaired  relaxation.  2. The right ventricle has normal systolic function. The cavity was normal. There is no increase in right ventricular wall thickness.  3. Left atrial size was mild-moderately dilated.  4. The tricuspid valve is grossly normal.  5. The aortic valve is tricuspid. Moderate  calcification of the aortic valve. Aortic valve regurgitation is mild by color flow Doppler. Severe stenosis of the aortic valve.  Cardiac catheterization 02/13/19 1.  Mild diffuse calcific coronary artery disease, primarily affecting the LAD and RCA territories 2.  Severe aortic stenosis with peak instantaneous gradient 51 mmHg, peak to peak gradient 44 mmHg, mean gradient 33 mmHg   Recommendations: Multidisciplinary heart valve team evaluation for treatment of severe symptomatic aortic stenosis.  Medical therapy for nonobstructive coronary artery disease.   Aortic Valve There is severe aortic valve stenosis. The aortic valve is calcified. There is restricted aortic valve motion. There is moderate aortic valve calcification and restriction.  The peak instantaneous gradient is 51 mmHg, Mean gradient 33 mmHg, and peak to peak gradient 44 mmHg.   Diagnostic Dominance: Right    EKG:  EKG is ordered today.  The ekg ordered today demonstrates SR rate 62 bpm with no acute ST/T wave changes.   Recent Labs: 03/10/2019: ALT 31 03/13/2019: Magnesium 2.1 03/27/2019: BUN 13; Creatinine, Ser 1.05; Hemoglobin 9.1; Platelets 510; Potassium 4.9; Sodium 140  Recent Lipid Panel    Component Value Date/Time   CHOL 191 03/28/2018 1542   TRIG 164 (H) 03/28/2018 1542   HDL 60 03/28/2018 1542   CHOLHDL 3.2 03/28/2018 1542   LDLCALC 98 03/28/2018 1542    Home Medications   Current Meds  Medication Sig  . acetaminophen (TYLENOL) 500 MG tablet Take 2 tablets (1,000 mg total) by mouth every 6 (six) hours.  Marland Kitchen aspirin EC 325 MG EC tablet Take 1 tablet (325 mg total) by mouth daily.  Marland Kitchen buPROPion (WELLBUTRIN XL) 300 MG 24 hr tablet Take 300 mg by mouth daily.   . Calcium Carbonate-Vitamin D (CALCIUM 600/VITAMIN D PO) Take 1 tablet by mouth daily.  . clonazePAM (KLONOPIN) 0.5 MG tablet Take 0.5 mg by mouth daily as needed for anxiety.   Marland Kitchen levothyroxine (SYNTHROID, LEVOTHROID) 75 MCG tablet Take 1 tablet (75 mcg  total) by mouth daily before breakfast.  . Magnesium 500 MG TABS Take 500 mg by mouth daily.   . metoprolol tartrate (LOPRESSOR) 25 MG tablet Take 0.5 tablets (12.5 mg total) by mouth 2 (two) times daily.  . Omega-3 Fatty Acids (FISH OIL) 1000 MG CAPS Take 2,000 mg by mouth 2 (two) times daily.   Marland Kitchen oxyCODONE (OXY IR/ROXICODONE) 5 MG immediate release tablet Take 1 tablet (5 mg total) by mouth every 6 (six) hours as needed for severe pain.  . polyethylene glycol (MIRALAX / GLYCOLAX) 17 g packet Take 17 g by mouth daily.  . pravastatin (PRAVACHOL) 40 MG tablet Take 40 mg by mouth daily.  . Vitamin D, Cholecalciferol, 50 MCG (2000 UT) CAPS Take 2,000 Units by mouth 2 (two) times a day.      Review of Systems      Review of Systems  Constitution: Negative for chills, fever  and malaise/fatigue.  Cardiovascular: Positive for dyspnea on exertion. Negative for chest pain, irregular heartbeat, leg swelling, near-syncope, orthopnea and palpitations.  Respiratory: Negative for cough, shortness of breath and wheezing.   Gastrointestinal: Negative for constipation, nausea and vomiting.  Neurological: Negative for dizziness, light-headedness and weakness.   All other systems reviewed and are otherwise negative except as noted above.  Physical Exam    VS:  BP 136/74   Pulse 63   Ht 4\' 11"  (1.499 m)   Wt 220 lb 1.9 oz (99.8 kg)   SpO2 96%   BMI 44.46 kg/m  , BMI Body mass index is 44.46 kg/m. GEN: Well nourished, well developed, in no acute distress. HEENT: normal. Neck: Supple, no JVD, carotid bruits, or masses. Cardiac: RRR, no murmurs, rubs, or gallops. No clubbing, cyanosis, edema.  Radials/DP/PT 2+ and equal bilaterally.  Respiratory:  Respirations regular and unlabored, clear to auscultation bilaterally. GI: Soft, nontender, nondistended, BS + x 4. MS: No deformity or atrophy. Skin: Warm and dry, no rash.  Midsternal surgical incision as well as sites from chest tubes are clean, dry,  intact with no erythema nor signs of infection. Neuro:  Strength and sensation are intact. Psych: Normal affect.  Accessory Clinical Findings    ECG personally reviewed by me today - SR rate 62 bpm with no acute ST/T wave changes - no acute changes.  Assessment & Plan    1. Severe AS s/p SAVR- SAVR with 78mm [place] INSPIRIS RESILIA by Dr. Cyndia Bent on 03/12/2019.  Surgical sites clean, dry, intact. Has been walking 10 minutes 3 times per day, encouraged to continue. Upcoming orientation with cardiac rehab at [facility] on 03/31/19. Follow up with Dr. Cyndia Bent 04/08/19. Continue Aspirin 325 mg daily. BMET today.   2. DOE - Reports some improvement since surgery but still persists.  Etiology recent surgery versus deconditioning versus anemia.   3. Anemia - Hb 8.7 on 03/15/2019 while hospitalized.  Received no blood products while hospitalized.  Hemoglobin in June was 13.  Likely etiology of her DOE. CBC, anemia panel today.  She will start an iron tablet daily and if she tolerates well change to 1 tablet twice daily.  Education provided on iron rich foods.   4. Mild nonobstructive CAD - Cath 02/13/19 with mild diffuse calcific CAD; 40% in LAD and RCA. Denies chest pain. Continue GDMT of aspirin, beta blocker, statin.   5. Hypothyroidism - Follows with her PCP. TSH 3.59 on 12/2018. Continue Synthroid.   6. HLD - Presently on Pravastatin. New goal LDL <70 in setting of mild nonobstructive CAD discovered on cath 02/13/19. LDL 12/31/18 was 152. Plan to check fasting lipid profile at next visit.   Disposition: Follow up in 3 month(s) with Dr. Earney Navy, NP 03/29/2019, 8:31 PM

## 2019-03-28 LAB — BASIC METABOLIC PANEL
BUN/Creatinine Ratio: 12 (ref 12–28)
BUN: 13 mg/dL (ref 8–27)
CO2: 22 mmol/L (ref 20–29)
Calcium: 9.6 mg/dL (ref 8.7–10.3)
Chloride: 105 mmol/L (ref 96–106)
Creatinine, Ser: 1.05 mg/dL — ABNORMAL HIGH (ref 0.57–1.00)
GFR calc Af Amer: 62 mL/min/{1.73_m2} (ref >59)
GFR calc non Af Amer: 54 mL/min/{1.73_m2} — ABNORMAL LOW (ref >59)
Glucose: 90 mg/dL (ref 65–99)
Potassium: 4.9 mmol/L (ref 3.5–5.2)
Sodium: 140 mmol/L (ref 134–144)

## 2019-03-28 LAB — CBC
Hematocrit: 28.3 % — ABNORMAL LOW (ref 34.0–46.6)
Hemoglobin: 9.1 g/dL — ABNORMAL LOW (ref 11.1–15.9)
MCH: 26.8 pg (ref 26.6–33.0)
MCHC: 32.2 g/dL (ref 31.5–35.7)
MCV: 83 fL (ref 79–97)
Platelets: 510 10*3/uL — ABNORMAL HIGH (ref 150–450)
RBC: 3.4 x10E6/uL — ABNORMAL LOW (ref 3.77–5.28)
RDW: 14.2 % (ref 11.7–15.4)
WBC: 8.4 10*3/uL (ref 3.4–10.8)

## 2019-03-28 LAB — IRON,TIBC AND FERRITIN PANEL
Ferritin: 163 ng/mL — ABNORMAL HIGH (ref 15–150)
Iron Saturation: 8 % — CL (ref 15–55)
Iron: 30 ug/dL (ref 27–139)
Total Iron Binding Capacity: 361 ug/dL (ref 250–450)
UIBC: 331 ug/dL (ref 118–369)

## 2019-03-29 ENCOUNTER — Encounter: Payer: Self-pay | Admitting: Family

## 2019-04-06 ENCOUNTER — Other Ambulatory Visit: Payer: Self-pay | Admitting: Surgery

## 2019-04-06 DIAGNOSIS — Z952 Presence of prosthetic heart valve: Secondary | ICD-10-CM

## 2019-04-08 ENCOUNTER — Ambulatory Visit (INDEPENDENT_AMBULATORY_CARE_PROVIDER_SITE_OTHER): Payer: Self-pay | Admitting: Surgery

## 2019-04-08 ENCOUNTER — Ambulatory Visit
Admission: RE | Admit: 2019-04-08 | Discharge: 2019-04-08 | Disposition: A | Discharge status: Home / Self Care | Payer: Medicare Other | Source: Ambulatory Visit | Attending: Surgery | Admitting: Surgery

## 2019-04-08 ENCOUNTER — Encounter: Payer: Self-pay | Admitting: Surgery

## 2019-04-08 ENCOUNTER — Other Ambulatory Visit: Payer: Self-pay

## 2019-04-08 VITALS — BP 119/62 | HR 68 | Temp 97.3°F | Resp 18 | Ht 59.0 in | Wt 219.0 lb

## 2019-04-08 DIAGNOSIS — Z952 Presence of prosthetic heart valve: Secondary | ICD-10-CM

## 2019-04-08 NOTE — Progress Notes (Signed)
HPI: Patient returns for routine postoperative follow-up having undergone aortic valve replacement using a 21 mm INSPIRIS pericardial valve on 03/12/2019. The patient's early postoperative recovery while in the hospital was notable for an uncomplicated postop course. Since hospital discharge the patient reports that she has been feeling well.  She is walking some in the house and is doing Silver sneakers videos at home for exercise.  [place] level placed walk outside.  She has been evaluated by cardiac rehab at [facility] but says that she does not feel comfortable with going to cardiac rehab and would like to do her own exercises at home and in the swimming pool.   Current Outpatient Medications  Medication Sig Dispense Refill  . acetaminophen (TYLENOL) 500 MG tablet Take 2 tablets (1,000 mg total) by mouth every 6 (six) hours. 30 tablet 0  . aspirin EC 325 MG EC tablet Take 1 tablet (325 mg total) by mouth daily. 30 tablet 0  . buPROPion (WELLBUTRIN XL) 300 MG 24 hr tablet Take 300 mg by mouth daily.   0  . Calcium Carbonate-Vitamin D (CALCIUM 600/VITAMIN D PO) Take 1 tablet by mouth daily.    . clonazePAM (KLONOPIN) 0.5 MG tablet Take 0.5 mg by mouth daily as needed for anxiety.     . ferrous sulfate 325 (65 FE) MG tablet Take 18 mg by mouth daily with breakfast.    . levothyroxine (SYNTHROID, LEVOTHROID) 75 MCG tablet Take 1 tablet (75 mcg total) by mouth daily before breakfast. 90 tablet 3  . Magnesium 500 MG TABS Take 500 mg by mouth daily.     . metoprolol tartrate (LOPRESSOR) 25 MG tablet Take 0.5 tablets (12.5 mg total) by mouth 2 (two) times daily. 30 tablet 1  . Omega-3 Fatty Acids (FISH OIL) 1000 MG CAPS Take 2,000 mg by mouth 2 (two) times daily.     . polyethylene glycol (MIRALAX / GLYCOLAX) 17 g packet Take 17 g by mouth daily.    . pravastatin (PRAVACHOL) 40 MG tablet Take 40 mg by mouth daily.    . Vitamin D, Cholecalciferol,  50 MCG (2000 UT) CAPS Take 2,000 Units by mouth 2 (two) times a day.     No current facility-administered medications for this visit.     Physical Exam: BP 119/62 (BP Location: Right Arm, Patient Position: Sitting, Cuff Size: Large)   Pulse 68   Temp (!) 97.3 F (36.3 C)   Resp 18   Ht 4\' 11"  (1.499 m)   Wt 219 lb (99.3 kg)   SpO2 96% Comment: RA  BMI 44.23 kg/m  She looks well. Cardiac exam shows a regular rate and rhythm with no heart sounds. Lungs are clear. Chest incision is healing well and sternum is stable. There is no peripheral edema.  Diagnostic Tests:  CLINICAL DATA:  Status post AVR  EXAM: CHEST - 2 VIEW  COMPARISON:  None.  FINDINGS: The heart size is enlarged. The patient is status post prior median sternotomy. There is no pneumothorax. No large pleural effusion. No acute osseous abnormality. The patient is status post valve replacement.  IMPRESSION: No active cardiopulmonary disease.   Electronically Signed   By: Constance Holster M.D.   On: 04/08/2019 14:38   Impression:  Overall I think she is making a good recovery following her surgery.  I encouraged her to continue walking as much as possible.  I told her that she could  return to her exercises in her swimming pool at 6 weeks postoperatively.  I told her that she can return to driving a car but should refrain from lifting anything heavier than 10 pounds for 3 months postoperatively.  Plan:  She is going to follow-up with Dr. Lin Landsman and Dr. Bettina Gavia and will return to see me if she has any problems with her incisions.   Gaye Pollack, MD [facility] [phone]

## 2019-04-09 ENCOUNTER — Encounter: Payer: Self-pay | Admitting: Family

## 2019-04-14 ENCOUNTER — Telehealth: Payer: Self-pay | Admitting: Cardiology

## 2019-04-14 NOTE — Telephone Encounter (Signed)
FYI

## 2019-04-14 NOTE — Telephone Encounter (Signed)
Pt declined cardiac rehab

## 2019-05-10 ENCOUNTER — Encounter: Payer: Self-pay | Admitting: Family

## 2019-05-10 DIAGNOSIS — I2510 Atherosclerotic heart disease of native coronary artery without angina pectoris: Secondary | ICD-10-CM

## 2019-05-10 DIAGNOSIS — Z952 Presence of prosthetic heart valve: Secondary | ICD-10-CM

## 2019-05-11 MED ORDER — METOPROLOL TARTRATE 25 MG PO TABS: 12.5000 mg | ORAL_TABLET | Freq: Two times a day (BID) | ORAL | 0 refills | Status: DC

## 2019-06-25 NOTE — Progress Notes (Signed)
Cardiology Office Note:    Date:  06/26/2019   ID:  Tammy Hunt, DOB Dec 20, 1945, MRN [id number]  PCP:  Angelina Sheriff, MD  Cardiologist:  Shirlee More, MD    Referring MD: Angelina Sheriff, MD    ASSESSMENT:    1. S/P AVR (aortic valve replacement)   2. Mild CAD   3. Mixed hyperlipidemia   4. Anemia due to other cause, not classified    PLAN:    In order of problems listed above:  1. Stable she did an exceptional recovery reduce aspirin 81 daily after 90 days check baseline echocardiogram. 2. Stable asymptomatic on statin check lipid profile 3. Check CBC and if normal stop iron. 4. Hypertension continue beta-blocker   Next appointment: In 6 months   Medication Adjustments/Labs and Tests Ordered: Current medicines are reviewed at length with the patient today.  Concerns regarding medicines are outlined above.  No orders of the defined types were placed in this encounter.  No orders of the defined types were placed in this encounter.   Chief Complaint  Patient presents with  . Follow-up  . Aortic Stenosis    after SAVR 04/08/2019    History of Present Illness:    Tammy Hunt is a 73 y.o. female with a hx of symptomatic AS and surgical  aortic valve replacement using a 21 mm INSPIRIS pericardial valve last seen 04/08/2019.  Prior to surgery or cardiac imaging studies were reviewed and her CTA studies with the multidisciplinary heart valve team. She has a left main height of 10.6 mm and the RCA height of 9.5 mm which are borderline for TAVR. In addition she has relatively small sinuses and a 22 to 24 mm range. It is felt that she would be at increased risk for coronary occlusion from the native valve leaflets and that open surgery would  be the best option for her valvular aortic stenosis.  Compliance with diet, lifestyle and medications: Yes  She had acceptable recovery from her valve surgery feels very well her previous shortness of breath and exercise  tolerance is resolved and she said she has not felt this well in years no incisional pain and she is pleased with the residual scar.  No shortness of breath palpitation or syncope.  She is taking high-dose aspirin reduced 81 and January 1 is taking iron for postoperative anemia we will check a CBC and if normal stop her iron supplements.  She is on a low-dose beta-blocker for hypertension at target continue the same.  She needs an immediate postoperative echocardiogram is a fingerprint and has been ordered.  Left heart cath 02/13/2019: Coronary Diagram Dominance: Right   Past Medical History:  Diagnosis Date  . Anxiety   . Arthritis   . Depression   . Diverticulitis   . Heart murmur   . Hyperlipidemia   . Mild nonobstructive coronary artery disease    (a) 02/13/19 cath with 40% stenosis to LAD and RCA - mild nonobstructive disease rec for medical mgmt  . Mitral valve regurgitation   . Osteopenia   . Palpitations   . Papilloma of breast   . S/P AVR (aortic valve replacement) 03/12/2019   (a) SAVR with 26mm [place] percardial valve  . Severe aortic stenosis    s/p SAVR 03/12/19  . Thyroid disease     Past Surgical History:  Procedure Laterality Date  . AORTIC VALVE REPLACEMENT N/A 03/12/2019   Procedure: AORTIC VALVE REPLACEMENT (AVR);  Surgeon: Gaye Pollack, MD;  Location: [place];  Service: Open Heart Surgery;  Laterality: N/A;  . BIOPSY THYROID  JAN 2015  . BOWEL RESECTION    . BREAST SURGERY     Left breast  . CESAREAN SECTION    . CHOLECYSTECTOMY    . COLONOSCOPY  07/26/2014   Colonic polyps statuas post polypectomy. Mild neo diverticulosis. External hemorrhoids.   . COLOSTOMY TAKEDOWN    . CORONARY ANGIOPLASTY    . FINGER SURGERY    . LEFT HEART CATH AND CORONARY ANGIOGRAPHY N/A 02/13/2019   Procedure: LEFT HEART CATH AND CORONARY ANGIOGRAPHY;  Surgeon: Sherren Mocha, MD;  Location: [place] [facility];  Service: Cardiovascular;  Laterality: N/A;  . TEE  WITHOUT CARDIOVERSION N/A 03/12/2019   Procedure: TRANSESOPHAGEAL ECHOCARDIOGRAM (TEE);  Surgeon: Gaye Pollack, MD;  Location: [place];  Service: Open Heart Surgery;  Laterality: N/A;  . TUBAL LIGATION      Current Medications: No outpatient medications have been marked as taking for the 06/26/19 encounter (Office Visit) with Richardo Priest, MD.     Allergies:   Patient has no known allergies.   Social History   Socioeconomic History  . Marital status: Married    Spouse name: Not on file  . Number of children: Not on file  . Years of education: Not on file  . Highest education level: Not on file  Occupational History  . Not on file  Tobacco Use  . Smoking status: Former Smoker    Types: Cigarettes  . Smokeless tobacco: Never Used  Substance and Sexual Activity  . Alcohol use: No  . Drug use: No  . Sexual activity: Not on file  Other Topics Concern  . Not on file  Social History Narrative  . Not on file   Social Determinants of Health   Financial Resource Strain:   . Difficulty of Paying Living Expenses: Not on file  Food Insecurity:   . Worried About Charity fundraiser in the Last Year: Not on file  . Ran Out of Food in the Last Year: Not on file  Transportation Needs:   . Lack of Transportation (Medical): Not on file  . Lack of Transportation (Non-Medical): Not on file  Physical Activity:   . Days of Exercise per Week: Not on file  . Minutes of Exercise per Session: Not on file  Stress:   . Feeling of Stress : Not on file  Social Connections:   . Frequency of Communication with Friends and Family: Not on file  . Frequency of Social Gatherings with Friends and Family: Not on file  . Attends Religious Services: Not on file  . Active Member of Clubs or Organizations: Not on file  . Attends Archivist Meetings: Not on file  . Marital Status: Not on file     Family History: The patient's family history includes Breast cancer in her maternal aunt;  Cancer in her maternal aunt, maternal grandmother, maternal uncle, and mother; Heart disease in her father; Lung cancer in her mother. ROS:   Please see the history of present illness.    All other systems reviewed and are negative.  EKGs/Labs/Other Studies Reviewed:    The following studies were reviewed today:  CXR 04/08/2019: FINDINGS: The heart size is enlarged. The patient is status post prior median sternotomy. There is no pneumothorax. No large pleural effusion. No acute osseous abnormality. The patient is status post valve replacement. IMPRESSION: No active cardiopulmonary disease.  Recent Labs: 03/10/2019: ALT 31 03/13/2019: Magnesium 2.1 03/27/2019: BUN 13; Creatinine, Ser 1.05; Hemoglobin 9.1; Platelets 510; Potassium 4.9; Sodium 140  Recent Lipid Panel    Component Value Date/Time   CHOL 191 03/28/2018 1542   TRIG 164 (H) 03/28/2018 1542   HDL 60 03/28/2018 1542   CHOLHDL 3.2 03/28/2018 1542   LDLCALC 98 03/28/2018 1542    Physical Exam:    VS:  There were no vitals taken for this visit.    Wt Readings from Last 3 Encounters:  04/08/19 219 lb (99.3 kg)  03/27/19 220 lb 1.9 oz (99.8 kg)  03/17/19 224 lb 6.4 oz (101.8 kg)     GEN:  Well nourished, well developed in no acute distress she has no pallor HEENT: Normal NECK: No JVD; No carotid bruits LYMPHATICS: No lymphadenopathy CARDIAC: There is no murmur RRR, no murmurs, rubs, gallops she has a very minimal soft thin sternal scar RESPIRATORY:  Clear to auscultation without rales, wheezing or rhonchi  ABDOMEN: Soft, non-tender, non-distended MUSCULOSKELETAL:  No edema; No deformity  SKIN: Warm and dry NEUROLOGIC:  Alert and oriented x 3 PSYCHIATRIC:  Normal affect    Signed, Shirlee More, MD  06/26/2019 3:21 PM    [facility]

## 2019-06-25 NOTE — Telephone Encounter (Signed)
Patient with large abdominal wall hernia May have to see her in person Please make sure we have CT scan report at visit  Thanks  RG

## 2019-06-26 ENCOUNTER — Encounter: Payer: Self-pay | Admitting: Cardiology

## 2019-06-26 ENCOUNTER — Other Ambulatory Visit: Payer: Self-pay

## 2019-06-26 ENCOUNTER — Ambulatory Visit (INDEPENDENT_AMBULATORY_CARE_PROVIDER_SITE_OTHER): Payer: Medicare Other | Admitting: Cardiology

## 2019-06-26 VITALS — BP 122/70 | HR 70 | Ht 59.0 in | Wt 224.0 lb

## 2019-06-26 DIAGNOSIS — I2510 Atherosclerotic heart disease of native coronary artery without angina pectoris: Secondary | ICD-10-CM

## 2019-06-26 DIAGNOSIS — D6489 Other specified anemias: Secondary | ICD-10-CM

## 2019-06-26 DIAGNOSIS — E782 Mixed hyperlipidemia: Secondary | ICD-10-CM

## 2019-06-26 DIAGNOSIS — Z952 Presence of prosthetic heart valve: Secondary | ICD-10-CM

## 2019-06-26 NOTE — Telephone Encounter (Signed)
Patient returned call to the office-patient informed of MD recommendations; patient is agreeable with plan of care and verified f/u appt date/time (06/29/2019 at 11:20 am) to be in person to be assessed by MD; Patient verbalized understanding of information/instructions;  Patient was advised to call the office at [phone] if questions/concerns arise;   Dr. Ria Comment CTA abd/pelvis was ordered by Dr. Burt Knack and is in the results for 02/20/2019 for review

## 2019-06-26 NOTE — Patient Instructions (Signed)
Medication Instructions:  Your physician has recommended you make the following change in your medication:   DECREASE aspirin from 325 to 81 mg 1 tablet daily starting 07/17/2019  *If you need a refill on your cardiac medications before your next appointment, please call your pharmacy*  Lab Work: Your physician recommends that you return for lab work today: CMP, lipid panel, CBC.   If you have labs (blood work) drawn today and your tests are completely normal, you will receive your results only by: Marland Kitchen MyChart Message (if you have MyChart) OR . A paper copy in the mail If you have any lab test that is abnormal or we need to change your treatment, we will call you to review the results.  Testing/Procedures: Your physician has requested that you have an echocardiogram. Echocardiography is a painless test that uses sound waves to create images of your heart. It provides your doctor with information about the size and shape of your heart and how well your heart's chambers and valves are working. This procedure takes approximately one hour. There are no restrictions for this procedure.  Follow-Up: At [facility], you and your health needs are our priority.  As part of our continuing mission to provide you with exceptional heart care, we have created designated Provider Care Teams.  These Care Teams include your primary Cardiologist (physician) and Advanced Practice Providers (APPs -  Physician Assistants and Nurse Practitioners) who all work together to provide you with the care you need, when you need it.  Your next appointment:   6 month(s)  The format for your next appointment:   In Person  Provider:   Shirlee More, MD

## 2019-06-26 NOTE — Telephone Encounter (Signed)
Left message for patient to call back to the office;  

## 2019-06-27 LAB — COMPREHENSIVE METABOLIC PANEL
ALT: 29 IU/L (ref 0–32)
AST: 22 IU/L (ref 0–40)
Albumin/Globulin Ratio: 2 (ref 1.2–2.2)
Albumin: 4.4 g/dL (ref 3.7–4.7)
Alkaline Phosphatase: 78 IU/L (ref 39–117)
BUN/Creatinine Ratio: 15 (ref 12–28)
BUN: 15 mg/dL (ref 8–27)
Bilirubin Total: 0.3 mg/dL (ref 0.0–1.2)
CO2: 23 mmol/L (ref 20–29)
Calcium: 9.7 mg/dL (ref 8.7–10.3)
Chloride: 103 mmol/L (ref 96–106)
Creatinine, Ser: 1.01 mg/dL — ABNORMAL HIGH (ref 0.57–1.00)
GFR calc Af Amer: 64 mL/min/{1.73_m2} (ref >59)
GFR calc non Af Amer: 56 mL/min/{1.73_m2} — ABNORMAL LOW (ref >59)
Globulin, Total: 2.2 g/dL (ref 1.5–4.5)
Glucose: 84 mg/dL (ref 65–99)
Potassium: 4.6 mmol/L (ref 3.5–5.2)
Sodium: 140 mmol/L (ref 134–144)
Total Protein: 6.6 g/dL (ref 6.0–8.5)

## 2019-06-27 LAB — CBC
Hematocrit: 40.7 % (ref 34.0–46.6)
Hemoglobin: 13.2 g/dL (ref 11.1–15.9)
MCH: 26.3 pg — ABNORMAL LOW (ref 26.6–33.0)
MCHC: 32.4 g/dL (ref 31.5–35.7)
MCV: 81 fL (ref 79–97)
Platelets: 269 10*3/uL (ref 150–450)
RBC: 5.01 x10E6/uL (ref 3.77–5.28)
RDW: 14.8 % (ref 11.7–15.4)
WBC: 7 10*3/uL (ref 3.4–10.8)

## 2019-06-27 LAB — LIPID PANEL
Chol/HDL Ratio: 4 ratio (ref 0.0–4.4)
Cholesterol, Total: 263 mg/dL — ABNORMAL HIGH (ref 100–199)
HDL: 65 mg/dL (ref >39)
LDL Chol Calc (NIH): 166 mg/dL — ABNORMAL HIGH (ref 0–99)
Triglycerides: 176 mg/dL — ABNORMAL HIGH (ref 0–149)
VLDL Cholesterol Cal: 32 mg/dL (ref 5–40)

## 2019-06-29 ENCOUNTER — Telehealth: Payer: Self-pay | Admitting: *Deleted

## 2019-06-29 ENCOUNTER — Encounter: Payer: Self-pay | Admitting: Gastroenterology

## 2019-06-29 ENCOUNTER — Ambulatory Visit: Payer: Medicare Other | Admitting: Gastroenterology

## 2019-06-29 ENCOUNTER — Other Ambulatory Visit: Payer: Self-pay

## 2019-06-29 VITALS — BP 122/76 | HR 58 | Temp 98.3°F | Ht 59.0 in | Wt 225.1 lb

## 2019-06-29 DIAGNOSIS — K436 Other and unspecified ventral hernia with obstruction, without gangrene: Secondary | ICD-10-CM

## 2019-06-29 NOTE — Telephone Encounter (Signed)
-----   Message from Richardo Priest, MD sent at 06/28/2019  5:50 PM EST ----- Normal or stable result  Good results except for lipids.  Her hemoglobin is normal.  We have 2 good choices the first that she could stand pravastatin if she has trouble tolerating statins start Zetia 10 mg daily follow-up lipid profile in about 6 to 8 weeks or she could switch to a more potent statin such as rosuvastatin 10 mg daily and a follow-up lipid profile in 6 to 8 weeks.

## 2019-06-29 NOTE — Patient Instructions (Signed)
If you are age 72 or older, your body mass index should be between 23-30. Your Body mass index is 45.47 kg/m. If this is out of the aforementioned range listed, please consider follow up with your Primary Care Provider.  If you are age 7 or younger, your body mass index should be between 19-25. Your Body mass index is 45.47 kg/m. If this is out of the aformentioned range listed, please consider follow up with your Primary Care Provider.   Follow up 6 months.  Thank you,  Dr. Jackquline Denmark

## 2019-06-29 NOTE — Telephone Encounter (Signed)
Patient informed of lab results and states that she has had myalgia with rosuvastatin and pravastatin. Advised that Dr. Bettina Gavia recommends her starting zetia 10 mg daily to help with her elevated cholesterol levels. Patient is hesitant and wants to think about if she wants to go back on rosuvastatin or start zetia. She will call back with her decision. Dr. Bettina Gavia made aware. No further questions.

## 2019-06-29 NOTE — Progress Notes (Signed)
Chief Complaint: Discuss colonoscopy  Referring Provider:  Angelina Sheriff, MD      ASSESSMENT AND PLAN;   #1.  H/O PSBO (June 2018) due to adhesions/internal henia - managed conservatively at [facility] (Dr Brantley Stage from Surgery). CTA chest/Abdo/pelvis- large infraumbilical ventral hernia containing multiple loops of small bowel.  No bowel incarceration or obstruction.  #2. H/O colonic polyps - next colon 07/2019  #3.  H/O diverticulitis s/p Hartman's procedure 2008 followed by takedown of ostomy with LOA (Dr Jola Schmidt)  Plan: -Continue current medications (miralax) and diet. -Patient wants to hold off on colonoscopy for now.  ? Aug 2021 -FU 6 months -I have reassured patient.  Currently she is not having any abdominal pain /N/V.  We have gone over CT scan results in detail.  If she starts having any problems including -pain, N/V-she will seek immediate care.  She will also get in touch with [name].  She agrees to hold off on elective hernia repair as it will be very extensive surgery.  Same has been recommended by Dr. Brantley Stage (surgery)-to manage conservatively.  HPI:    Tammy Hunt is a 73 y.o. female   S/P TAVR for severe aortic stenosis 03/12/2019. Doing much better from cardiac standpoint.  From GI standpoint-denies having any significant GI complaints.  Has been taking MiraLAX every day.  Denies having any constipation but does pass " small pieces" in the stool.  No abdominal pain.  No nausea, vomiting, heartburn, regurgitation, odynophagia or dysphagia.  No significant diarrhea or constipation.  No melena or hematochezia.   Had CTA chest Abdo/pelvis, for preop TAVR-showing large infraumbilical ventral hernia without incarceration or obstruction.  She would like to hold off on colonoscopy. Past Medical History:  Diagnosis Date  . Anxiety   . Arthritis   . Depression   . Diverticulitis   . Heart murmur   . Hyperlipidemia   . Mild nonobstructive coronary artery disease     (a) 02/13/19 cath with 40% stenosis to LAD and RCA - mild nonobstructive disease rec for medical mgmt  . Mitral valve regurgitation   . Osteopenia   . Palpitations   . Papilloma of breast   . S/P AVR (aortic valve replacement) 03/12/2019   (a) SAVR with 34mm [place] percardial valve  . Severe aortic stenosis    s/p SAVR 03/12/19  . Thyroid disease     Past Surgical History:  Procedure Laterality Date  . AORTIC VALVE REPLACEMENT N/A 03/12/2019   Procedure: AORTIC VALVE REPLACEMENT (AVR);  Surgeon: Gaye Pollack, MD;  Location: [place];  Service: Open Heart Surgery;  Laterality: N/A;  . BIOPSY THYROID  JAN 2015  . BOWEL RESECTION    . BREAST SURGERY     Left breast  . CESAREAN SECTION    . CHOLECYSTECTOMY    . COLONOSCOPY  07/26/2014   Colonic polyps statuas post polypectomy. Mild neo diverticulosis. External hemorrhoids.   . COLOSTOMY TAKEDOWN    . CORONARY ANGIOPLASTY    . FINGER SURGERY    . LEFT HEART CATH AND CORONARY ANGIOGRAPHY N/A 02/13/2019   Procedure: LEFT HEART CATH AND CORONARY ANGIOGRAPHY;  Surgeon: Sherren Mocha, MD;  Location: [place] [facility];  Service: Cardiovascular;  Laterality: N/A;  . TEE WITHOUT CARDIOVERSION N/A 03/12/2019   Procedure: TRANSESOPHAGEAL ECHOCARDIOGRAM (TEE);  Surgeon: Gaye Pollack, MD;  Location: [place];  Service: Open Heart Surgery;  Laterality: N/A;  . TUBAL LIGATION      Family  History  Problem Relation Age of Onset  . Lung cancer Mother   . Cancer Mother        lung-smoker  . Heart disease Father   . Cancer Maternal Grandmother        liver  . Breast cancer Maternal Aunt        age 18  . Cancer Maternal Aunt        lymphoma  . Cancer Maternal Uncle        lung    Social History   Tobacco Use  . Smoking status: Former Smoker    Types: Cigarettes  . Smokeless tobacco: Never Used  Substance Use Topics  . Alcohol use: No  . Drug use: No    Current Outpatient Medications  Medication Sig Dispense Refill   . aspirin EC 325 MG EC tablet Take 1 tablet (325 mg total) by mouth daily. 30 tablet 0  . buPROPion (WELLBUTRIN XL) 300 MG 24 hr tablet Take 300 mg by mouth daily.   0  . Calcium Carbonate-Vitamin D (CALCIUM 600/VITAMIN D PO) Take 1 tablet by mouth daily.    . clonazePAM (KLONOPIN) 0.5 MG tablet Take 0.5 mg by mouth daily as needed for anxiety.     . Ferrous Sulfate (IRON SUPPLEMENT CHILDRENS PO) Take 18 mg by mouth daily. Take 2 (9mg ) tablets daily    . levothyroxine (SYNTHROID, LEVOTHROID) 75 MCG tablet Take 1 tablet (75 mcg total) by mouth daily before breakfast. 90 tablet 3  . Magnesium 500 MG TABS Take 500 mg by mouth daily.     . metoprolol tartrate (LOPRESSOR) 25 MG tablet Take 0.5 tablets (12.5 mg total) by mouth 2 (two) times daily. 90 tablet 0  . Omega-3 Fatty Acids (FISH OIL) 1000 MG CAPS Take 2,000 mg by mouth 2 (two) times daily.     . polyethylene glycol (MIRALAX / GLYCOLAX) 17 g packet Take 17 g by mouth daily.    . pravastatin (PRAVACHOL) 40 MG tablet Take 40 mg by mouth daily.    . Vitamin D, Cholecalciferol, 50 MCG (2000 UT) CAPS Take 2,000 Units by mouth 2 (two) times a day.     No current facility-administered medications for this visit.    No Known Allergies  Review of Systems:  neg     Physical Exam:   Vitals -blood pressure 128/70, weight 220 pounds, pulse 66/min with BMI 44 Constitutional:  Well-developed, in no acute distress. Psychiatric: Normal mood and affect. Behavior is normal. HEENT: Pupils normal.  Conjunctivae are normal. No scleral icterus. Neck supple.  Cardiovascular: Normal rate, regular rhythm. No edema Pulmonary/chest: Effort normal and breath sounds normal. No wheezing, rales or rhonchi. Abdominal: Soft, nondistended. Nontender. Bowel sounds active throughout. There are no masses palpable. No hepatomegaly.  Well-healed surgical scars.  Abdominal wall ventral hernia left lower quadrant at the site of previous colostomy. Rectal:   defered Neurological: Alert and oriented to person place and time. Skin: Skin is warm and dry. No rashes noted.  Data Reviewed: I have personally reviewed following labs and imaging studies  CBC: CBC Latest Ref Rng & Units 06/26/2019 03/27/2019 03/15/2019  WBC 3.4 - 10.8 x10E3/uL 7.0 8.4 7.6  Hemoglobin 11.1 - 15.9 g/dL 13.2 9.1(L) 8.7(L)  Hematocrit 34.0 - 46.6 % 40.7 28.3(L) 25.7(L)  Platelets 150 - 450 x10E3/uL 269 510(H) 163    CMP: CMP Latest Ref Rng & Units 06/26/2019 03/27/2019 03/15/2019  Glucose 65 - 99 mg/dL 84 90 98  BUN 8 - 27  mg/dL 15 13 10   Creatinine 0.57 - 1.00 mg/dL 1.01(H) 1.05(H) 0.86  Sodium 134 - 144 mmol/L 140 140 142  Potassium 3.5 - 5.2 mmol/L 4.6 4.9 4.1  Chloride 96 - 106 mmol/L 103 105 108  CO2 20 - 29 mmol/L 23 22 27   Calcium 8.7 - 10.3 mg/dL 9.7 9.6 8.6(L)  Total Protein 6.0 - 8.5 g/dL 6.6 - -  Total Bilirubin 0.0 - 1.2 mg/dL 0.3 - -  Alkaline Phos 39 - 117 IU/L 78 - -  AST 0 - 40 IU/L 22 - -  ALT 0 - 32 IU/L 29 - -  25 minutes spent with the patient today. Greater than 50% was spent in counseling and coordination of care with the patient CT was reviewed independently and with the patient   Carmell Austria, MD 06/29/2019, 11:43 AM  Cc: Angelina Sheriff, MD

## 2019-07-01 ENCOUNTER — Telehealth: Payer: Self-pay | Admitting: Cardiology

## 2019-07-01 DIAGNOSIS — E785 Hyperlipidemia, unspecified: Secondary | ICD-10-CM

## 2019-07-01 NOTE — Telephone Encounter (Signed)
Returning your call about a decision she's supposed to make

## 2019-07-02 ENCOUNTER — Other Ambulatory Visit: Payer: Self-pay

## 2019-07-02 ENCOUNTER — Ambulatory Visit (HOSPITAL_BASED_OUTPATIENT_CLINIC_OR_DEPARTMENT_OTHER)
Admission: RE | Admit: 2019-07-02 | Discharge: 2019-07-02 | Disposition: A | Discharge status: Home / Self Care | Payer: Medicare Other | Source: Ambulatory Visit | Attending: Cardiology | Admitting: Cardiology

## 2019-07-02 DIAGNOSIS — E785 Hyperlipidemia, unspecified: Secondary | ICD-10-CM | POA: Insufficient documentation

## 2019-07-02 DIAGNOSIS — Z952 Presence of prosthetic heart valve: Secondary | ICD-10-CM | POA: Diagnosis not present

## 2019-07-02 DIAGNOSIS — Z953 Presence of xenogenic heart valve: Secondary | ICD-10-CM | POA: Diagnosis not present

## 2019-07-02 DIAGNOSIS — I2510 Atherosclerotic heart disease of native coronary artery without angina pectoris: Secondary | ICD-10-CM | POA: Diagnosis not present

## 2019-07-02 MED ORDER — ROSUVASTATIN CALCIUM 10 MG PO TABS: 10.0000 mg | ORAL_TABLET | Freq: Every day | ORAL | 1 refills | Status: DC

## 2019-07-02 NOTE — Addendum Note (Signed)
Addended by: Austin Miles on: 07/02/2019 12:32 PM   Modules accepted: Orders

## 2019-07-02 NOTE — Progress Notes (Signed)
  Echocardiogram 2D Echocardiogram has been performed.  Tammy Hunt 07/02/2019, 2:03 PM

## 2019-07-02 NOTE — Telephone Encounter (Signed)
Patient has decided that she prefers to restart rosuvastatin versus try the new cholesterol medication zetia. Dr. Bettina Gavia advised for patient to take rosuvastatin 10 mg daily. Patient is agreeable and verbalized understanding to return to our office in 6-8 weeks for repeat lab work (lipid panel), no appointment needed. She will fast beforehand. Prescription has been sent to [facility] in [place] as requested. No further questions.

## 2019-08-09 ENCOUNTER — Other Ambulatory Visit: Payer: Self-pay | Admitting: Family

## 2019-08-09 DIAGNOSIS — Z952 Presence of prosthetic heart valve: Secondary | ICD-10-CM

## 2019-08-09 DIAGNOSIS — I2510 Atherosclerotic heart disease of native coronary artery without angina pectoris: Secondary | ICD-10-CM

## 2019-08-10 MED ORDER — METOPROLOL TARTRATE 25 MG PO TABS: 12.5000 mg | ORAL_TABLET | Freq: Two times a day (BID) | ORAL | 0 refills | Status: DC

## 2019-09-25 DIAGNOSIS — E785 Hyperlipidemia, unspecified: Secondary | ICD-10-CM | POA: Diagnosis not present

## 2019-09-25 LAB — LIPID PANEL
Chol/HDL Ratio: 3.3 ratio (ref 0.0–4.4)
Cholesterol, Total: 199 mg/dL (ref 100–199)
HDL: 61 mg/dL (ref >39)
LDL Chol Calc (NIH): 108 mg/dL — ABNORMAL HIGH (ref 0–99)
Triglycerides: 174 mg/dL — ABNORMAL HIGH (ref 0–149)
VLDL Cholesterol Cal: 30 mg/dL (ref 5–40)

## 2019-11-11 ENCOUNTER — Other Ambulatory Visit: Payer: Self-pay | Admitting: Cardiology

## 2019-11-11 DIAGNOSIS — Z952 Presence of prosthetic heart valve: Secondary | ICD-10-CM

## 2019-11-11 DIAGNOSIS — I2510 Atherosclerotic heart disease of native coronary artery without angina pectoris: Secondary | ICD-10-CM

## 2019-11-18 DIAGNOSIS — Z6841 Body Mass Index (BMI) 40.0 and over, adult: Secondary | ICD-10-CM | POA: Diagnosis not present

## 2019-11-18 DIAGNOSIS — M170 Bilateral primary osteoarthritis of knee: Secondary | ICD-10-CM | POA: Diagnosis not present

## 2019-12-04 DIAGNOSIS — Z6841 Body Mass Index (BMI) 40.0 and over, adult: Secondary | ICD-10-CM | POA: Diagnosis not present

## 2019-12-04 DIAGNOSIS — H6120 Impacted cerumen, unspecified ear: Secondary | ICD-10-CM | POA: Diagnosis not present

## 2019-12-04 DIAGNOSIS — J302 Other seasonal allergic rhinitis: Secondary | ICD-10-CM | POA: Diagnosis not present

## 2019-12-15 DIAGNOSIS — J302 Other seasonal allergic rhinitis: Secondary | ICD-10-CM | POA: Diagnosis not present

## 2019-12-15 DIAGNOSIS — H6590 Unspecified nonsuppurative otitis media, unspecified ear: Secondary | ICD-10-CM | POA: Diagnosis not present

## 2019-12-15 DIAGNOSIS — H6120 Impacted cerumen, unspecified ear: Secondary | ICD-10-CM | POA: Diagnosis not present

## 2019-12-15 DIAGNOSIS — Z6841 Body Mass Index (BMI) 40.0 and over, adult: Secondary | ICD-10-CM | POA: Diagnosis not present

## 2019-12-15 DIAGNOSIS — K219 Gastro-esophageal reflux disease without esophagitis: Secondary | ICD-10-CM | POA: Diagnosis not present

## 2019-12-21 DIAGNOSIS — Z952 Presence of prosthetic heart valve: Secondary | ICD-10-CM | POA: Diagnosis not present

## 2019-12-21 DIAGNOSIS — R0609 Other forms of dyspnea: Secondary | ICD-10-CM | POA: Diagnosis not present

## 2019-12-22 ENCOUNTER — Encounter: Payer: Self-pay | Admitting: Family

## 2019-12-30 ENCOUNTER — Encounter: Payer: Self-pay | Admitting: Cardiology

## 2019-12-30 ENCOUNTER — Ambulatory Visit: Payer: Medicare PPO | Admitting: Cardiology

## 2019-12-30 ENCOUNTER — Other Ambulatory Visit: Payer: Self-pay

## 2019-12-30 VITALS — BP 130/78 | HR 62 | Ht 59.0 in | Wt 228.2 lb

## 2019-12-30 DIAGNOSIS — E782 Mixed hyperlipidemia: Secondary | ICD-10-CM | POA: Diagnosis not present

## 2019-12-30 DIAGNOSIS — Z952 Presence of prosthetic heart valve: Secondary | ICD-10-CM | POA: Diagnosis not present

## 2019-12-30 DIAGNOSIS — I2510 Atherosclerotic heart disease of native coronary artery without angina pectoris: Secondary | ICD-10-CM

## 2019-12-30 MED ORDER — ROSUVASTATIN CALCIUM 10 MG PO TABS: 10.0000 mg | ORAL_TABLET | Freq: Every day | ORAL | 3 refills | Status: DC

## 2019-12-30 NOTE — Progress Notes (Signed)
Cardiology Office Note:    Date:  12/30/2019   ID:  Tammy Hunt, DOB Mar 15, 1946, MRN [id number]  PCP:  Angelina Sheriff, MD  Cardiologist:  Shirlee More, MD    Referring MD: Angelina Sheriff, MD    ASSESSMENT:    1. S/P AVR (aortic valve replacement)   2. Mild CAD   3. Mixed hyperlipidemia   4. Hypokalemia    PLAN:    In order of problems listed above:  1. She continues to do well after TAVR New [place] Heart Association class I echocardiogram shows normal valve function continue current medical treatment including low-dose aspirin and high intensity statin and I will see back in the office 6 months 2. Stable CAD New York Heart Association class I continue treatment aspirin beta-blocker statin 3. Lipids at target continue rosuvastatin   Next appointment: 6 months   Medication Adjustments/Labs and Tests Ordered: Current medicines are reviewed at length with the patient today.  Concerns regarding medicines are outlined above.  No orders of the defined types were placed in this encounter.  No orders of the defined types were placed in this encounter.   No chief complaint on file.   History of Present Illness:    Tammy Hunt is a 74 y.o. female with a hx of symptomatic AS and surgical  aortic valve replacement using a 21 mm [place] pericardial valve  03/12/2019 hypokalemia last seen 06/26/2019. Compliance with diet, lifestyle and medications: Yes  She is fully active [facility] [employer] [facility] with [facility] no exercise intolerance shortness of breath edema chest pain palpitation or syncope.  She tolerates her statin without muscle pain or weakness.  Echocardiogram performed 06/26/2019 showed ejection fraction 60 to 65% normal biatrial size and normal TAVR function #21 [place] pericardial valve Past Medical History:  Diagnosis Date  . Anxiety   . Arthritis   . Depression   . Diverticulitis   . Heart murmur   . Hyperlipidemia   . Mild nonobstructive coronary  artery disease    (a) 02/13/19 cath with 40% stenosis to LAD and RCA - mild nonobstructive disease rec for medical mgmt  . Mitral valve regurgitation   . Osteopenia   . Palpitations   . Papilloma of breast   . S/P AVR (aortic valve replacement) 03/12/2019   (a) SAVR with 29mm [place] percardial valve  . Severe aortic stenosis    s/p SAVR 03/12/19  . Thyroid disease     Past Surgical History:  Procedure Laterality Date  . AORTIC VALVE REPLACEMENT N/A 03/12/2019   Procedure: AORTIC VALVE REPLACEMENT (AVR);  Surgeon: Gaye Pollack, MD;  Location: [place];  Service: Open Heart Surgery;  Laterality: N/A;  . BIOPSY THYROID  JAN 2015  . BOWEL RESECTION    . BREAST SURGERY     Left breast  . CESAREAN SECTION    . CHOLECYSTECTOMY    . COLONOSCOPY  07/26/2014   Colonic polyps statuas post polypectomy. Mild neo diverticulosis. External hemorrhoids.   . COLOSTOMY TAKEDOWN    . CORONARY ANGIOPLASTY    . FINGER SURGERY    . LEFT HEART CATH AND CORONARY ANGIOGRAPHY N/A 02/13/2019   Procedure: LEFT HEART CATH AND CORONARY ANGIOGRAPHY;  Surgeon: Sherren Mocha, MD;  Location: [place] [facility];  Service: Cardiovascular;  Laterality: N/A;  . TEE WITHOUT CARDIOVERSION N/A 03/12/2019   Procedure: TRANSESOPHAGEAL ECHOCARDIOGRAM (TEE);  Surgeon: Gaye Pollack, MD;  Location: [facility];  Service: Open Heart  Surgery;  Laterality: N/A;  . TUBAL LIGATION      Current Medications: Current Meds  Medication Sig  . aspirin EC 81 MG tablet Take 81 mg by mouth daily. Swallow whole.  Marland Kitchen buPROPion (WELLBUTRIN XL) 300 MG 24 hr tablet Take 300 mg by mouth daily.   . Calcium Carbonate-Vitamin D (CALCIUM 600/VITAMIN D PO) Take 1 tablet by mouth daily.  . clonazePAM (KLONOPIN) 0.5 MG tablet Take 0.5 mg by mouth daily as needed for anxiety.   . Coenzyme Q10 (CO Q-10 PO) Take 1 tablet by mouth daily.  Marland Kitchen levothyroxine (SYNTHROID, LEVOTHROID) 75 MCG tablet Take 1 tablet (75 mcg total) by mouth daily  before breakfast.  . Magnesium 500 MG TABS Take 500 mg by mouth daily.   . metoprolol tartrate (LOPRESSOR) 25 MG tablet TAKE 1/2 TABLET BY MOUTH TWICE DAILY.  Marland Kitchen Omega-3 Fatty Acids (FISH OIL) 1000 MG CAPS Take 2,000 mg by mouth 2 (two) times daily.   . polyethylene glycol (MIRALAX / GLYCOLAX) 17 g packet Take 17 g by mouth daily.  . Vitamin D, Cholecalciferol, 50 MCG (2000 UT) CAPS Take 2,000 Units by mouth 2 (two) times a day.  . [DISCONTINUED] Ferrous Sulfate (IRON SUPPLEMENT CHILDRENS PO) Take 18 mg by mouth daily. Take 2 (9mg ) tablets daily     Allergies:   Patient has no known allergies.   Social History   Socioeconomic History  . Marital status: Married    Spouse name: Not on file  . Number of children: Not on file  . Years of education: Not on file  . Highest education level: Not on file  Occupational History  . Not on file  Tobacco Use  . Smoking status: Former Smoker    Types: Cigarettes  . Smokeless tobacco: Never Used  Vaping Use  . Vaping Use: Never used  Substance and Sexual Activity  . Alcohol use: No  . Drug use: No  . Sexual activity: Not on file  Other Topics Concern  . Not on file  Social History Narrative  . Not on file   Social Determinants of Health   Financial Resource Strain:   . Difficulty of Paying Living Expenses:   Food Insecurity:   . Worried About Charity fundraiser in the Last Year:   . Arboriculturist in the Last Year:   Transportation Needs:   . Film/video editor (Medical):   Marland Kitchen Lack of Transportation (Non-Medical):   Physical Activity:   . Days of Exercise per Week:   . Minutes of Exercise per Session:   Stress:   . Feeling of Stress :   Social Connections:   . Frequency of Communication with Friends and Family:   . Frequency of Social Gatherings with Friends and Family:   . Attends Religious Services:   . Active Member of Clubs or Organizations:   . Attends Archivist Meetings:   Marland Kitchen Marital Status:      Family  History: The patient's family history includes Breast cancer in her maternal aunt; Cancer in her maternal aunt, maternal grandmother, maternal uncle, and mother; Heart disease in her father; Lung cancer in her mother. ROS:   Please see the history of present illness.    All other systems reviewed and are negative.  EKGs/Labs/Other Studies Reviewed:      Recent Labs: 03/13/2019: Magnesium 2.1 06/26/2019: ALT 29 12/21/2019: BUN 17; Creatinine, Ser 0.69; Hemoglobin 14.8; Platelets 262; Potassium 3.4; Sodium 141  Recent Lipid Panel  Component Value Date/Time   CHOL 199 09/25/2019 1047   TRIG 174 (H) 09/25/2019 1047   HDL 61 09/25/2019 1047   CHOLHDL 3.3 09/25/2019 1047   LDLCALC 108 (H) 09/25/2019 1047    Physical Exam:    VS:  BP 130/78   Pulse 62   Ht 4\' 11"  (1.499 m)   Wt 228 lb 3.2 oz (103.5 kg)   SpO2 98%   BMI 46.09 kg/m     Wt Readings from Last 3 Encounters:  12/30/19 228 lb 3.2 oz (103.5 kg)  06/29/19 225 lb 2 oz (102.1 kg)  06/26/19 224 lb (101.6 kg)     GEN:  Well nourished, well developed in no acute distress HEENT: Normal NECK: No JVD; No carotid bruits LYMPHATICS: No lymphadenopathy CARDIAC: 1/6 midsystolic ejection murmur RRR, no murmurs, rubs, gallops RESPIRATORY:  Clear to auscultation without rales, wheezing or rhonchi  ABDOMEN: Soft, non-tender, non-distended MUSCULOSKELETAL:  No edema; No deformity  SKIN: Warm and dry NEUROLOGIC:  Alert and oriented x 3 PSYCHIATRIC:  Normal affect    Signed, Shirlee More, MD  12/30/2019 2:19 PM    [facility]

## 2019-12-30 NOTE — Patient Instructions (Signed)

## 2020-01-01 ENCOUNTER — Other Ambulatory Visit: Payer: Self-pay

## 2020-01-04 ENCOUNTER — Ambulatory Visit: Payer: Medicare PPO | Admitting: Nurse Practitioner

## 2020-01-04 ENCOUNTER — Encounter: Payer: Self-pay | Admitting: Nurse Practitioner

## 2020-01-04 ENCOUNTER — Other Ambulatory Visit: Payer: Self-pay

## 2020-01-04 VITALS — BP 126/78 | Ht <= 58 in | Wt 226.0 lb

## 2020-01-04 DIAGNOSIS — Z780 Asymptomatic menopausal state: Secondary | ICD-10-CM

## 2020-01-04 DIAGNOSIS — M8589 Other specified disorders of bone density and structure, multiple sites: Secondary | ICD-10-CM

## 2020-01-04 DIAGNOSIS — Z01419 Encounter for gynecological examination (general) (routine) without abnormal findings: Secondary | ICD-10-CM | POA: Diagnosis not present

## 2020-01-04 NOTE — Patient Instructions (Signed)
Health Maintenance After Age 73 After age 73, you are at a higher risk for certain long-term diseases and infections as well as injuries from falls. Falls are a major cause of broken bones and head injuries in people who are older than age 73. Getting regular preventive care can help to keep you healthy and well. Preventive care includes getting regular testing and making lifestyle changes as recommended by your health care provider. Talk with your health care provider about:  Which screenings and tests you should have. A screening is a test that checks for a disease when you have no symptoms.  A diet and exercise plan that is right for you. What should I know about screenings and tests to prevent falls? Screening and testing are the best ways to find a health problem early. Early diagnosis and treatment give you the best chance of managing medical conditions that are common after age 73. Certain conditions and lifestyle choices may make you more likely to have a fall. Your health care provider may recommend:  Regular vision checks. Poor vision and conditions such as cataracts can make you more likely to have a fall. If you wear glasses, make sure to get your prescription updated if your vision changes.  Medicine review. Work with your health care provider to regularly review all of the medicines you are taking, including over-the-counter medicines. Ask your health care provider about any side effects that may make you more likely to have a fall. Tell your health care provider if any medicines that you take make you feel dizzy or sleepy.  Osteoporosis screening. Osteoporosis is a condition that causes the bones to get weaker. This can make the bones weak and cause them to break more easily.  Blood pressure screening. Blood pressure changes and medicines to control blood pressure can make you feel dizzy.  Strength and balance checks. Your health care provider may recommend certain tests to check your  strength and balance while standing, walking, or changing positions.  Foot health exam. Foot pain and numbness, as well as not wearing proper footwear, can make you more likely to have a fall.  Depression screening. You may be more likely to have a fall if you have a fear of falling, feel emotionally low, or feel unable to do activities that you used to do.  Alcohol use screening. Using too much alcohol can affect your balance and may make you more likely to have a fall. What actions can I take to lower my risk of falls? General instructions  Talk with your health care provider about your risks for falling. Tell your health care provider if: ? You fall. Be sure to tell your health care provider about all falls, even ones that seem minor. ? You feel dizzy, sleepy, or off-balance.  Take over-the-counter and prescription medicines only as told by your health care provider. These include any supplements.  Eat a healthy diet and maintain a healthy weight. A healthy diet includes low-fat dairy products, low-fat (lean) meats, and fiber from whole grains, beans, and lots of fruits and vegetables. Home safety  Remove any tripping hazards, such as rugs, cords, and clutter.  Install safety equipment such as grab bars in bathrooms and safety rails on stairs.  Keep rooms and walkways well-lit. Activity   Follow a regular exercise program to stay fit. This will help you maintain your balance. Ask your health care provider what types of exercise are appropriate for you.  If you need a cane or   walker, use it as recommended by your health care provider.  Wear supportive shoes that have nonskid soles. Lifestyle  Do not drink alcohol if your health care provider tells you not to drink.  If you drink alcohol, limit how much you have: ? 0-1 drink a day for women. ? 0-2 drinks a day for men.  Be aware of how much alcohol is in your drink. In the [place] one drink equals one typical bottle of beer (12  oz), one-half glass of wine (5 oz), or one shot of hard liquor (1 oz).  Do not use any products that contain nicotine or tobacco, such as cigarettes and e-cigarettes. If you need help quitting, ask your health care provider. Summary  Having a healthy lifestyle and getting preventive care can help to protect your health and wellness after age 73.  Screening and testing are the best way to find a health problem early and help you avoid having a fall. Early diagnosis and treatment give you the best chance for managing medical conditions that are more common for people who are older than age 73.  Falls are a major cause of broken bones and head injuries in people who are older than age 73. Take precautions to prevent a fall at home.  Work with your health care provider to learn what changes you can make to improve your health and wellness and to prevent falls. This information is not intended to replace advice given to you by your health care provider. Make sure you discuss any questions you have with your health care provider. Document Revised: 10/23/2018 Document Reviewed: 05/15/2017 Elsevier Patient Education  2020 [employer]  

## 2020-01-04 NOTE — Progress Notes (Signed)
   Tammy Hunt October 12, 1945 [id number]   History:  74 y.o. G2 P1 presents for breast and pelvic exam without GYN complaints. Postmenopausal-no HRT, no bleeding. History of endometrial polyp, fibroids, vitamin D deficiency, and osteopenia. Taking daily Vitamin D. PCP manages hypothyroidism, HLD, vitamin D deficiency. Aortic valve replacement 03/12/2019, followed by cardiology. Colectomy with ostomy in the past for hernia and adhesions, reversed, followed by GI.   Gynecologic History No LMP recorded. Patient is postmenopausal.   Contraception: post menopausal status Last Pap:12/21/2014 . Results were: normal.  No longer screening per patient request and current guidelines Last mammogram: 01/22/2019. Results were: normal Last Dexa: 02/11/2018. Results were: T-score -2.2, FRAX 9.6% / 1.7%  Past medical history, past surgical history, family history and social history were all reviewed and documented in the EPIC chart.  ROS:  A ROS was performed and pertinent positives and negatives are included.  Exam:  Vitals:   01/04/20 1426  Weight: 226 lb (102.5 kg)  Height: 4\' 10"  (1.473 m)   Body mass index is 47.23 kg/m.  General appearance:  Normal Thyroid:  Symmetrical, normal in size, without palpable masses or nodularity. Respiratory  Auscultation:  Clear without wheezing or rhonchi Cardiovascular  Auscultation:  Regular rate, without rubs, murmurs or gallops  Edema/varicosities:  Not grossly evident Abdominal  Soft,nontender, without masses, guarding or rebound.  Liver/spleen:  No organomegaly noted  Hernia:  None appreciated  Skin  Inspection:  Grossly normal   Breasts: Examined lying and sitting.   Right: Without masses, retractions, discharge or axillary adenopathy.   Left: Without masses, retractions, discharge or axillary adenopathy. Gentitourinary   Inguinal/mons:  Normal without inguinal adenopathy  External genitalia:  Normal  BUS/Urethra/Skene's glands:  Normal  Vagina:   Normal, atrophic changes  Cervix:  Normal  Uterus:  Normal in size, shape and contour.  Midline and mobile  Adnexa/parametria:     Rt: Without masses or tenderness.   Lt: Without masses or tenderness.  Anus and perineum: Normal  Digital rectal exam: Normal sphincter tone without palpated masses or tenderness  Assessment/Plan:  74 y.o. G2 P1 for breast and pelvic exam without GYN complaints.   Well female exam with routine gynecological exam - Education provided on SBEs, importance of preventative screenings, current guidelines, high calcium diet, daily vitamin D supplement, regular exercise, and multivitamin daily.   Osteopenia of multiple sites -continue daily vitamin D supplement, regular exercise with weightbearing exercises.  Fall prevention and home safety.  Postmenopausal - Plan: DG Bone Density.  She will schedule this at the breast center  Screening for breast cancer -mammogram scheduled for next month  Follow-up in 2 years for physical       Olga, 2:29 PM 01/04/2020

## 2020-01-15 DIAGNOSIS — D225 Melanocytic nevi of trunk: Secondary | ICD-10-CM | POA: Diagnosis not present

## 2020-01-15 DIAGNOSIS — L820 Inflamed seborrheic keratosis: Secondary | ICD-10-CM | POA: Diagnosis not present

## 2020-01-15 DIAGNOSIS — D2239 Melanocytic nevi of other parts of face: Secondary | ICD-10-CM | POA: Diagnosis not present

## 2020-01-15 DIAGNOSIS — D1801 Hemangioma of skin and subcutaneous tissue: Secondary | ICD-10-CM | POA: Diagnosis not present

## 2020-01-15 DIAGNOSIS — L814 Other melanin hyperpigmentation: Secondary | ICD-10-CM | POA: Diagnosis not present

## 2020-02-03 ENCOUNTER — Encounter: Payer: Self-pay | Admitting: Nurse Practitioner

## 2020-02-03 DIAGNOSIS — Z1231 Encounter for screening mammogram for malignant neoplasm of breast: Secondary | ICD-10-CM | POA: Diagnosis not present

## 2020-02-03 DIAGNOSIS — M85852 Other specified disorders of bone density and structure, left thigh: Secondary | ICD-10-CM | POA: Diagnosis not present

## 2020-02-03 DIAGNOSIS — M85851 Other specified disorders of bone density and structure, right thigh: Secondary | ICD-10-CM | POA: Diagnosis not present

## 2020-02-08 NOTE — Progress Notes (Signed)
Order(s) created erroneously. Erroneous order ID: [id number]  Order moved by: Pete Pelt  Order move date/time: 02/08/2020 3:24 PM  Source Patient: [id number]  Source Contact: 03/27/2019  Destination Patient: [id number]  Destination Contact: 02/20/2018

## 2020-02-08 NOTE — Progress Notes (Signed)
Order(s) created erroneously. Erroneous order ID: [id number]  Order moved by: Pete Pelt  Order move date/time: 02/08/2020 3:26 PM  Source Patient: [id number]  Source Contact: 03/27/2019  Destination Patient: [id number]  Destination Contact: 02/20/2018

## 2020-02-12 ENCOUNTER — Encounter: Payer: Self-pay | Admitting: Nurse Practitioner

## 2020-02-14 DIAGNOSIS — U071 COVID-19: Secondary | ICD-10-CM | POA: Insufficient documentation

## 2020-02-14 HISTORY — DX: COVID-19: U07.1

## 2020-03-07 DIAGNOSIS — Z20828 Contact with and (suspected) exposure to other viral communicable diseases: Secondary | ICD-10-CM | POA: Diagnosis not present

## 2020-03-07 DIAGNOSIS — E039 Hypothyroidism, unspecified: Secondary | ICD-10-CM | POA: Diagnosis not present

## 2020-03-07 DIAGNOSIS — J329 Chronic sinusitis, unspecified: Secondary | ICD-10-CM | POA: Diagnosis not present

## 2020-03-07 DIAGNOSIS — J40 Bronchitis, not specified as acute or chronic: Secondary | ICD-10-CM | POA: Diagnosis not present

## 2020-04-13 DIAGNOSIS — M170 Bilateral primary osteoarthritis of knee: Secondary | ICD-10-CM | POA: Diagnosis not present

## 2020-04-13 DIAGNOSIS — Z6841 Body Mass Index (BMI) 40.0 and over, adult: Secondary | ICD-10-CM | POA: Diagnosis not present

## 2020-05-03 DIAGNOSIS — Z23 Encounter for immunization: Secondary | ICD-10-CM | POA: Diagnosis not present

## 2020-05-23 ENCOUNTER — Other Ambulatory Visit: Payer: Self-pay | Admitting: Cardiology

## 2020-05-23 DIAGNOSIS — I2510 Atherosclerotic heart disease of native coronary artery without angina pectoris: Secondary | ICD-10-CM

## 2020-05-23 DIAGNOSIS — Z952 Presence of prosthetic heart valve: Secondary | ICD-10-CM

## 2020-05-24 ENCOUNTER — Ambulatory Visit: Payer: Medicare PPO | Admitting: Gastroenterology

## 2020-05-24 ENCOUNTER — Telehealth: Payer: Self-pay

## 2020-05-24 ENCOUNTER — Encounter: Payer: Self-pay | Admitting: Gastroenterology

## 2020-05-24 VITALS — BP 116/70 | HR 69 | Ht <= 58 in | Wt 227.5 lb

## 2020-05-24 DIAGNOSIS — Z85038 Personal history of other malignant neoplasm of large intestine: Secondary | ICD-10-CM

## 2020-05-24 DIAGNOSIS — D126 Benign neoplasm of colon, unspecified: Secondary | ICD-10-CM

## 2020-05-24 NOTE — Telephone Encounter (Signed)
[facility] Pre-operative Risk Assessment     Request for surgical clearance:     Endoscopy Procedure  What type of surgery is being performed?     Colonscopy   When is this surgery scheduled?     07/2020  What type of clearance is required ?   Pharmacy  Are there any medications that need to be held prior to surgery and how long? None/ Cardiac Clearance needed   Practice name and name of physician performing surgery?      [facility]  What is your office phone and fax number?      Phone- [phone]  [phone]  Anesthesia type (None, local, MAC, general) ?       MAC

## 2020-05-24 NOTE — Progress Notes (Signed)
Chief Complaint: Discuss colonoscopy  Referring Provider:  Angelina Sheriff, MD      ASSESSMENT AND PLAN;   #1. H/O colonic polyps - next colon 07/2019  #3.  H/O diverticulitis s/p Hartman's procedure 2008 followed by takedown of ostomy with LOA (Dr Jola Schmidt)  #3. 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 CTA chest/Abdo/pelvis- large infraumbilical ventral hernia containing multiple loops of small bowel.  No bowel incarceration or obstruction.  Plan: -Continue miralax 17g po qd -Colon with MiraLAX prep after cardio clearance (from Dr. Bettina Gavia) at [facility].  Risks and benefits were discussed.  She does satisfy LEC criteria.  HPI:    Tammy Hunt is a 74 y.o. female   S/P TAVR for severe aortic stenosis 03/12/2019 (bioprosthetic valve, no AC) Doing much better from cardiac standpoint.  Seen recently by Dr. Bettina Gavia.  Echo showed normal EF 06/2019.  From GI standpoint-denies having any significant GI complaints.  Has been taking MiraLAX every day.  Denies having any constipation but does pass " small pieces" in the stool.  No abdominal pain.  No nausea, vomiting, heartburn, regurgitation, odynophagia or dysphagia.  No significant diarrhea or constipation.  No melena or hematochezia.   Had CTA chest Abdo/pelvis, for preop TAVR-showing large infraumbilical ventral hernia without incarceration or obstruction.  She would like to proceed with colonoscopy due to family history and previous history of colonic polyps. Past Medical History:  Diagnosis Date  . Anxiety   . Arthritis   . Depression   . Diverticulitis   . Heart murmur   . Hyperlipidemia   . Mild nonobstructive coronary artery disease    (a) 02/13/19 cath with 40% stenosis to LAD and RCA - mild nonobstructive disease rec for medical mgmt  . Mitral valve regurgitation   . Osteopenia   . Palpitations   . Papilloma of breast   . S/P AVR (aortic valve  replacement) 03/12/2019   (a) SAVR with 35mm [place] percardial valve  . Severe aortic stenosis    s/p SAVR 03/12/19  . Thyroid disease     Past Surgical History:  Procedure Laterality Date  . AORTIC VALVE REPLACEMENT N/A 03/12/2019   Procedure: AORTIC VALVE REPLACEMENT (AVR);  Surgeon: Gaye Pollack, MD;  Location: [place];  Service: Open Heart Surgery;  Laterality: N/A;  . BIOPSY THYROID  JAN 2015  . BOWEL RESECTION    . BREAST SURGERY     Left breast  . CESAREAN SECTION    . CHOLECYSTECTOMY    . COLONOSCOPY  07/26/2014   Colonic polyps statuas post polypectomy. Mild neo diverticulosis. External hemorrhoids.   . COLOSTOMY TAKEDOWN    . CORONARY ANGIOPLASTY    . FINGER SURGERY    . LEFT HEART CATH AND CORONARY ANGIOGRAPHY N/A 02/13/2019   Procedure: LEFT HEART CATH AND CORONARY ANGIOGRAPHY;  Surgeon: Sherren Mocha, MD;  Location: [place] [facility];  Service: Cardiovascular;  Laterality: N/A;  . TEE WITHOUT CARDIOVERSION N/A 03/12/2019   Procedure: TRANSESOPHAGEAL ECHOCARDIOGRAM (TEE);  Surgeon: Gaye Pollack, MD;  Location: [place];  Service: Open Heart Surgery;  Laterality: N/A;  . TUBAL LIGATION      Family History  Problem Relation Age of Onset  . Lung cancer Mother   . Cancer Mother        lung-smoker  . Heart disease Father   . Cancer Maternal Grandmother        liver  .  Breast cancer Maternal Aunt        age 73  . Cancer Maternal Aunt        lymphoma  . Cancer Maternal Uncle        lung  . Colon cancer Neg Hx     Social History   Tobacco Use  . Smoking status: Former Smoker    Types: Cigarettes  . Smokeless tobacco: Never Used  Vaping Use  . Vaping Use: Never used  Substance Use Topics  . Alcohol use: No  . Drug use: No    Current Outpatient Medications  Medication Sig Dispense Refill  . aspirin EC 81 MG tablet Take 81 mg by mouth daily. Swallow whole.    Marland Kitchen buPROPion (WELLBUTRIN XL) 300 MG 24 hr tablet Take 300 mg by mouth daily.    0  . Calcium Carbonate-Vitamin D (CALCIUM 600/VITAMIN D PO) Take 1 tablet by mouth daily.    . clonazePAM (KLONOPIN) 0.5 MG tablet Take 0.5 mg by mouth daily as needed for anxiety.     Marland Kitchen levothyroxine (SYNTHROID, LEVOTHROID) 75 MCG tablet Take 1 tablet (75 mcg total) by mouth daily before breakfast. 90 tablet 3  . Magnesium 500 MG TABS Take 500 mg by mouth daily.     . metoprolol tartrate (LOPRESSOR) 25 MG tablet TAKE 1/2 TABLET BY MOUTH TWICE DAILY. 90 tablet 1  . Omega-3 Fatty Acids (FISH OIL) 1000 MG CAPS Take 2,000 mg by mouth 2 (two) times daily.     . polyethylene glycol (MIRALAX / GLYCOLAX) 17 g packet Take 17 g by mouth daily.    . rosuvastatin (CRESTOR) 10 MG tablet Take 1 tablet (10 mg total) by mouth daily. 90 tablet 3  . Vitamin D, Cholecalciferol, 50 MCG (2000 UT) CAPS Take 2,000 Units by mouth daily.      No current facility-administered medications for this visit.    No Known Allergies  Review of Systems:  neg     Physical Exam:   Vitals -blood pressure 128/70, weight 220 pounds, pulse 66/min with BMI 44 Constitutional:  Well-developed, in no acute distress. Psychiatric: Normal mood and affect. Behavior is normal. HEENT: Pupils normal.  Conjunctivae are normal. No scleral icterus. Neck supple.  Cardiovascular: Normal rate, regular rhythm. No edema Pulmonary/chest: Effort normal and breath sounds normal. No wheezing, rales or rhonchi. Abdominal: Soft, nondistended. Nontender. Bowel sounds active throughout. There are no masses palpable. No hepatomegaly.  Well-healed surgical scars.  Abdominal wall ventral hernia left lower quadrant at the site of previous colostomy. Rectal:  defered Neurological: Alert and oriented to person place and time. Skin: Skin is warm and dry. No rashes noted.  Data Reviewed: I have personally reviewed following labs and imaging studies  CBC: CBC Latest Ref Rng & Units 06/26/2019 03/27/2019 03/15/2019  WBC 3.4 - 10.8 x10E3/uL 7.0 8.4 7.6   Hemoglobin 11.1 - 15.9 g/dL 13.2 9.1(L) 8.7(L)  Hematocrit 34.0 - 46.6 % 40.7 28.3(L) 25.7(L)  Platelets 150 - 450 x10E3/uL 269 510(H) 163    CMP: CMP Latest Ref Rng & Units 06/26/2019 03/27/2019 03/15/2019  Glucose 65 - 99 mg/dL 84 90 98  BUN 8 - 27 mg/dL 15 13 10   Creatinine 0.57 - 1.00 mg/dL 1.01(H) 1.05(H) 0.86  Sodium 134 - 144 mmol/L 140 140 142  Potassium 3.5 - 5.2 mmol/L 4.6 4.9 4.1  Chloride 96 - 106 mmol/L 103 105 108  CO2 20 - 29 mmol/L 23 22 27   Calcium 8.7 - 10.3 mg/dL 9.7 9.6 8.6(L)  Total Protein 6.0 - 8.5 g/dL 6.6 - -  Total Bilirubin 0.0 - 1.2 mg/dL 0.3 - -  Alkaline Phos 39 - 117 IU/L 78 - -  AST 0 - 40 IU/L 22 - -  ALT 0 - 32 IU/L 29 - -     Carmell Austria, MD 05/24/2020, 3:19 PM  Cc: Angelina Sheriff, MD

## 2020-05-24 NOTE — Patient Instructions (Addendum)
If you are age 73 or older, your body mass index should be between 23-30. Your Body mass index is 47.55 kg/m. If this is out of the aforementioned range listed, please consider follow up with your Primary Care Provider.  If you are age 22 or younger, your body mass index should be between 19-25. Your Body mass index is 47.55 kg/m. If this is out of the aformentioned range listed, please consider follow up with your Primary Care Provider.   You have been scheduled for a colonoscopy. Please follow written instructions given to you at your visit today.  Please pick up your prep supplies at the pharmacy within the next 1-3 days. If you use inhalers (even only as needed), please bring them with you on the day of your procedure.   Thank you,  Dr. Jackquline Denmark

## 2020-05-25 NOTE — Telephone Encounter (Signed)
   Primary Cardiologist: Shirlee More, MD  Chart reviewed as part of pre-operative protocol coverage. Patient was contacted 05/25/2020 in reference to pre-operative risk assessment for pending surgery as outlined below.  Tammy Hunt was last seen on 12/30/19 by Dr. Bettina Gavia.  Since that day, Tammy Hunt has done fine from a cardiac standpoint. She has chronic DOE, though no significant change recently. She can complete 4 METs without anginal complaints.  Therefore, based on ACC/AHA guidelines, the patient would be at acceptable risk for the planned procedure without further cardiovascular testing.   The patient was advised that if she develops new symptoms prior to surgery to contact our office to arrange for a follow-up visit, and she verbalized understanding.  I will route this recommendation to the requesting party via Epic fax function and remove from pre-op pool. Please call with questions.  Abigail Butts, PA-C 05/25/2020, 12:00 PM

## 2020-05-29 NOTE — Telephone Encounter (Signed)
Cleared by cardiology for colonoscopy RG

## 2020-06-17 DIAGNOSIS — R002 Palpitations: Secondary | ICD-10-CM | POA: Insufficient documentation

## 2020-06-17 DIAGNOSIS — J329 Chronic sinusitis, unspecified: Secondary | ICD-10-CM | POA: Diagnosis not present

## 2020-06-17 DIAGNOSIS — E079 Disorder of thyroid, unspecified: Secondary | ICD-10-CM | POA: Insufficient documentation

## 2020-06-17 DIAGNOSIS — Z20828 Contact with and (suspected) exposure to other viral communicable diseases: Secondary | ICD-10-CM | POA: Diagnosis not present

## 2020-06-17 DIAGNOSIS — J40 Bronchitis, not specified as acute or chronic: Secondary | ICD-10-CM | POA: Diagnosis not present

## 2020-06-17 DIAGNOSIS — R011 Cardiac murmur, unspecified: Secondary | ICD-10-CM | POA: Insufficient documentation

## 2020-06-28 ENCOUNTER — Encounter: Payer: Medicare PPO | Admitting: Gastroenterology

## 2020-07-05 ENCOUNTER — Ambulatory Visit: Payer: Medicare PPO | Admitting: Cardiology

## 2020-07-05 ENCOUNTER — Other Ambulatory Visit: Payer: Self-pay

## 2020-07-05 ENCOUNTER — Encounter: Payer: Self-pay | Admitting: Cardiology

## 2020-07-05 VITALS — BP 128/72 | HR 66 | Ht <= 58 in | Wt 227.0 lb

## 2020-07-05 DIAGNOSIS — Z952 Presence of prosthetic heart valve: Secondary | ICD-10-CM | POA: Diagnosis not present

## 2020-07-05 DIAGNOSIS — E782 Mixed hyperlipidemia: Secondary | ICD-10-CM

## 2020-07-05 DIAGNOSIS — I2510 Atherosclerotic heart disease of native coronary artery without angina pectoris: Secondary | ICD-10-CM

## 2020-07-05 NOTE — Progress Notes (Addendum)
Cardiology Office Note:    Date:  07/05/2020   ID:  [name], DOB 1945-12-18, MRN [id number]  PCP:  Angelina Sheriff, MD  Cardiologist:  Shirlee More, MD    Referring MD: Angelina Sheriff, MD    ASSESSMENT:    1. S/P AVR (aortic valve replacement)   2. Mild CAD   3. Mixed hyperlipidemia    PLAN:    In order of problems listed above:  1. Stable asymptomatic we will simply hold aspirin around the time of colonoscopy. 2. Stable mild CAD having no angina on current medical therapy continue aspirin beta-blocker and high intensity statin 3. Recheck CMP and lipids now that she is back on a high intensity statin   Next appointment: 1 year   Medication Adjustments/Labs and Tests Ordered: Current medicines are reviewed at length with the patient today.  Concerns regarding medicines are outlined above.  Orders Placed This Encounter  Procedures  . Comp Met (CMET)  . Lipid panel  . EKG 12-Lead   No orders of the defined types were placed in this encounter.   Chief Complaint  Patient presents with  . Follow-up    She has had a tissue AVR     History of Present Illness:    Tammy Hunt is a 74 y.o. female with a hx of symptomatic severe aortic stenosis status post surgical aortic valve replacement using a 21 InspirEase pericardial valve 03/12/2019.  Other problems include mild CAD hypokalemia and hyperlipidemia.  Echocardiogram performed 06/26/2019 shows EF of 60 to 65% and normal aortic valve function.  She was last seen 12/30/2019. Compliance with diet, lifestyle and medications: Yes  She is puzzled why in the past she underwent colonoscopy without any concerns and now needed clearance I told her without organization anytime people had valve replacement it triggers a process automatically.  From a cardiology perspective doing well no edema shortness of breath chest pain palpitations syncope back on rosuvastatin tolerating a lower dose and will recheck liver renal  function and lipid profile.  I told her she would simply hold her aspirin for 1 week before and 1 to 2 days after colonoscopy depending on biopsies. Past Medical History:  Diagnosis Date  . Anxiety   . Arthritis   . COVID-19 virus infection 02/2020   had vaccine in March 2021 and breakthough in 02/2020 and the antibodiy infusion   . Depression   . Diverticulitis   . Heart murmur   . Hyperlipidemia   . Mild nonobstructive coronary artery disease    (a) 02/13/19 cath with 40% stenosis to LAD and RCA - mild nonobstructive disease rec for medical mgmt  . Mitral valve regurgitation   . Osteopenia   . Palpitations   . Papilloma of breast   . S/P AVR (aortic valve replacement) 03/12/2019   (a) SAVR with 14m [place]apercardial valve  . Severe aortic stenosis    s/p SAVR 03/12/19  . Thyroid disease     Past Surgical History:  Procedure Laterality Date  . AORTIC VALVE REPLACEMENT N/A 03/12/2019   Procedure: AORTIC VALVE REPLACEMENT (AVR);  Surgeon: BGaye Pollack MD;  Location: [place]  Service: Open Heart Surgery;  Laterality: N/A;  . BIOPSY THYROID  JAN 2015  . BOWEL RESECTION    . BREAST SURGERY     Left breast  . CESAREAN SECTION    . CHOLECYSTECTOMY    . COLONOSCOPY  07/26/2014   Colonic polyps statuas post  polypectomy. Mild neo diverticulosis. External hemorrhoids.   . COLOSTOMY TAKEDOWN    . CORONARY ANGIOPLASTY    . FINGER SURGERY    . LEFT HEART CATH AND CORONARY ANGIOGRAPHY N/A 02/13/2019   Procedure: LEFT HEART CATH AND CORONARY ANGIOGRAPHY;  Surgeon: Sherren Mocha, MD;  Location: [place] [facility];  Service: Cardiovascular;  Laterality: N/A;  . TEE WITHOUT CARDIOVERSION N/A 03/12/2019   Procedure: TRANSESOPHAGEAL ECHOCARDIOGRAM (TEE);  Surgeon: Gaye Pollack, MD;  Location: [place];  Service: Open Heart Surgery;  Laterality: N/A;  . TUBAL LIGATION      Current Medications: Current Meds  Medication Sig  . aspirin EC 81 MG tablet Take 81 mg by mouth  daily. Swallow whole.  Marland Kitchen buPROPion (WELLBUTRIN XL) 300 MG 24 hr tablet Take 300 mg by mouth daily.   . Calcium Carbonate-Vitamin D (CALCIUM 600/VITAMIN D PO) Take 1 tablet by mouth daily.  . clonazePAM (KLONOPIN) 0.5 MG tablet Take 0.5 mg by mouth daily as needed for anxiety.   Marland Kitchen levothyroxine (SYNTHROID, LEVOTHROID) 75 MCG tablet Take 1 tablet (75 mcg total) by mouth daily before breakfast.  . Magnesium 500 MG TABS Take 500 mg by mouth daily.   . metoprolol tartrate (LOPRESSOR) 25 MG tablet TAKE 1/2 TABLET BY MOUTH TWICE DAILY.  Marland Kitchen Omega-3 Fatty Acids (FISH OIL) 1000 MG CAPS Take 2,000 mg by mouth 2 (two) times daily.   . polyethylene glycol (MIRALAX / GLYCOLAX) 17 g packet Take 17 g by mouth daily.  . rosuvastatin (CRESTOR) 10 MG tablet Take 1 tablet (10 mg total) by mouth daily.  . Vitamin D, Cholecalciferol, 50 MCG (2000 UT) CAPS Take 2,000 Units by mouth daily.      Allergies:   Patient has no known allergies.   Social History   Socioeconomic History  . Marital status: Married    Spouse name: Not on file  . Number of children: Not on file  . Years of education: Not on file  . Highest education level: Not on file  Occupational History  . Not on file  Tobacco Use  . Smoking status: Former Smoker    Types: Cigarettes  . Smokeless tobacco: Never Used  Vaping Use  . Vaping Use: Never used  Substance and Sexual Activity  . Alcohol use: No  . Drug use: No  . Sexual activity: Not Currently    Birth control/protection: Surgical  Other Topics Concern  . Not on file  Social History Narrative  . Not on file   Social Determinants of Health   Financial Resource Strain: Not on file  Food Insecurity: Not on file  Transportation Needs: Not on file  Physical Activity: Not on file  Stress: Not on file  Social Connections: Not on file     Family History: The patient's family history includes Breast cancer in her maternal aunt; Cancer in her maternal aunt, maternal grandmother,  maternal uncle, and mother; Heart disease in her father; Lung cancer in her mother. There is no history of Colon cancer. ROS:   Please see the history of present illness.    All other systems reviewed and are negative.  EKGs/Labs/Other Studies Reviewed:    The following studies were reviewed today:    Recent Labs: No results found for requested labs within last 8760 hours.  Recent Lipid Panel    Component Value Date/Time   CHOL 199 09/25/2019 1047   TRIG 174 (H) 09/25/2019 1047   HDL 61 09/25/2019 1047   CHOLHDL 3.3 09/25/2019  1047   LDLCALC 108 (H) 09/25/2019 1047    Physical Exam:    VS:  BP 128/72   Pulse 66   Ht 4' 10" (1.473 m)   Wt 227 lb (103 kg)   SpO2 99%   BMI 47.44 kg/m     Wt Readings from Last 3 Encounters:  07/05/20 227 lb (103 kg)  05/24/20 227 lb 8 oz (103.2 kg)  01/04/20 226 lb (102.5 kg)     GEN:  Well nourished, well developed in no acute distress HEENT: Normal NECK: No JVD; No carotid bruits LYMPHATICS: No lymphadenopathy CARDIAC: Grade 1/6 to 2/6 localized aortic midsystolic murmur S2 normal no AR RRR, no murmurs, rubs, gallops RESPIRATORY:  Clear to auscultation without rales, wheezing or rhonchi  ABDOMEN: Soft, non-tender, non-distended MUSCULOSKELETAL:  No edema; No deformity  SKIN: Warm and dry NEUROLOGIC:  Alert and oriented x 3 PSYCHIATRIC:  Normal affect    Signed, Shirlee More, MD  07/05/2020 2:34 PM    [place]

## 2020-07-05 NOTE — Patient Instructions (Signed)
Medication Instructions:  No medication changes. *If you need a refill on your cardiac medications before your next appointment, please call your pharmacy*   Lab Work: Your physician recommends that you have labs done in the office today. Your test included  complete metabolic panel and lipids.  If you have labs (blood work) drawn today and your tests are completely normal, you will receive your results only by: MyChart Message (if you have MyChart) OR A paper copy in the mail If you have any lab test that is abnormal or we need to change your treatment, we will call you to review the results.   Testing/Procedures: None ordered   Follow-Up: At [facility], you and your health needs are our priority.  As part of our continuing mission to provide you with exceptional heart care, we have created designated Provider Care Teams.  These Care Teams include your primary Cardiologist (physician) and Advanced Practice Providers (APPs -  Physician Assistants and Nurse Practitioners) who all work together to provide you with the care you need, when you need it.  We recommend signing up for the patient portal called "MyChart".  Sign up information is provided on this After Visit Summary.  MyChart is used to connect with patients for Virtual Visits (Telemedicine).  Patients are able to view lab/test results, encounter notes, upcoming appointments, etc.  Non-urgent messages can be sent to your provider as well.   To learn more about what you can do with MyChart, go to [email or web address].    Your next appointment:   12 month(s)  The format for your next appointment:   In Person  Provider:   Brian Munley, MD   Other Instructions NA  

## 2020-07-05 NOTE — Addendum Note (Signed)
Addended by: Truddie Hidden on: 07/05/2020 02:36 PM   Modules accepted: Orders

## 2020-07-06 ENCOUNTER — Telehealth: Payer: Self-pay | Admitting: *Deleted

## 2020-07-06 DIAGNOSIS — E785 Hyperlipidemia, unspecified: Secondary | ICD-10-CM

## 2020-07-06 LAB — COMPREHENSIVE METABOLIC PANEL
ALT: 28 IU/L (ref 0–32)
AST: 21 IU/L (ref 0–40)
Albumin/Globulin Ratio: 1.6 (ref 1.2–2.2)
Albumin: 4.1 g/dL (ref 3.7–4.7)
Alkaline Phosphatase: 67 IU/L (ref 44–121)
BUN/Creatinine Ratio: 18 (ref 12–28)
BUN: 17 mg/dL (ref 8–27)
Bilirubin Total: 0.4 mg/dL (ref 0.0–1.2)
CO2: 24 mmol/L (ref 20–29)
Calcium: 9.9 mg/dL (ref 8.7–10.3)
Chloride: 102 mmol/L (ref 96–106)
Creatinine, Ser: 0.97 mg/dL (ref 0.57–1.00)
GFR calc Af Amer: 67 mL/min/{1.73_m2} (ref >59)
GFR calc non Af Amer: 58 mL/min/{1.73_m2} — ABNORMAL LOW (ref >59)
Globulin, Total: 2.5 g/dL (ref 1.5–4.5)
Glucose: 83 mg/dL (ref 65–99)
Potassium: 5.1 mmol/L (ref 3.5–5.2)
Sodium: 140 mmol/L (ref 134–144)
Total Protein: 6.6 g/dL (ref 6.0–8.5)

## 2020-07-06 LAB — LIPID PANEL
Chol/HDL Ratio: 4 ratio (ref 0.0–4.4)
Cholesterol, Total: 241 mg/dL — ABNORMAL HIGH (ref 100–199)
HDL: 60 mg/dL (ref >39)
LDL Chol Calc (NIH): 149 mg/dL — ABNORMAL HIGH (ref 0–99)
Triglycerides: 176 mg/dL — ABNORMAL HIGH (ref 0–149)
VLDL Cholesterol Cal: 32 mg/dL (ref 5–40)

## 2020-07-06 NOTE — Telephone Encounter (Signed)
Patient made aware to go to the [place] office in 2 months for lab work and to be fasting.

## 2020-07-06 NOTE — Telephone Encounter (Signed)
-----   Message from Richardo Priest, MD sent at 07/06/2020 11:42 AM EST ----- Lipids remain quite high please add Zetia 10 mg daily to her statin and recheck in about 2 months

## 2020-07-06 NOTE — Telephone Encounter (Signed)
Okay, let us also check her lipids in 2 months

## 2020-07-06 NOTE — Telephone Encounter (Signed)
Results called to pt. Pt verbalized understanding.  Patient would like to hold off on adding another medication. She has had a hard time the past few months and is going to start going back to the [facility]. She really wants to try it without any more medication.  States she really wants to get herself back on track.  Advised her I would make Dr Bettina Gavia aware and make sure its ok for her to get repeat lab work still in 2 months to check on her progress. She is aware we will let her know if he advised anything otherwise.

## 2020-07-11 DIAGNOSIS — Z0000 Encounter for general adult medical examination without abnormal findings: Secondary | ICD-10-CM | POA: Diagnosis not present

## 2020-07-11 DIAGNOSIS — Z6841 Body Mass Index (BMI) 40.0 and over, adult: Secondary | ICD-10-CM | POA: Diagnosis not present

## 2020-07-11 DIAGNOSIS — Z1331 Encounter for screening for depression: Secondary | ICD-10-CM | POA: Diagnosis not present

## 2020-07-11 DIAGNOSIS — M1990 Unspecified osteoarthritis, unspecified site: Secondary | ICD-10-CM | POA: Diagnosis not present

## 2020-07-19 ENCOUNTER — Ambulatory Visit (AMBULATORY_SURGERY_CENTER): Payer: Medicare PPO | Admitting: Gastroenterology

## 2020-07-19 ENCOUNTER — Other Ambulatory Visit: Payer: Self-pay

## 2020-07-19 ENCOUNTER — Encounter: Payer: Self-pay | Admitting: Gastroenterology

## 2020-07-19 VITALS — BP 130/72 | HR 58 | Temp 97.1°F | Resp 13 | Ht <= 58 in | Wt 227.0 lb

## 2020-07-19 DIAGNOSIS — Z85038 Personal history of other malignant neoplasm of large intestine: Secondary | ICD-10-CM

## 2020-07-19 DIAGNOSIS — D120 Benign neoplasm of cecum: Secondary | ICD-10-CM | POA: Diagnosis not present

## 2020-07-19 DIAGNOSIS — Z86010 Personal history of colonic polyps: Secondary | ICD-10-CM | POA: Diagnosis not present

## 2020-07-19 MED ORDER — SODIUM CHLORIDE 0.9 % IV SOLN: 500.0000 mL | INTRAVENOUS | Status: DC

## 2020-07-19 NOTE — Progress Notes (Signed)
PT taken to PACU. Monitors in place. VSS. Report given to RN. 

## 2020-07-19 NOTE — Patient Instructions (Signed)
Handouts given for polyps and diverticulosis.  YOU HAD AN ENDOSCOPIC PROCEDURE TODAY AT THE [facility]:   Refer to the procedure report that was given to you for any specific questions about what was found during the examination.  If the procedure report does not answer your questions, please call your gastroenterologist to clarify.  If you requested that your care partner not be given the details of your procedure findings, then the procedure report has been included in a sealed envelope for you to review at your convenience later.  YOU SHOULD EXPECT: Some feelings of bloating in the abdomen. Passage of more gas than usual.  Walking can help get rid of the air that was put into your GI tract during the procedure and reduce the bloating. If you had a lower endoscopy (such as a colonoscopy or flexible sigmoidoscopy) you may notice spotting of blood in your stool or on the toilet paper. If you underwent a bowel prep for your procedure, you may not have a normal bowel movement for a few days.  Please Note:  You might notice some irritation and congestion in your nose or some drainage.  This is from the oxygen used during your procedure.  There is no need for concern and it should clear up in a day or so.  SYMPTOMS TO REPORT IMMEDIATELY:  Following lower endoscopy (colonoscopy or flexible sigmoidoscopy):  Excessive amounts of blood in the stool  Significant tenderness or worsening of abdominal pains  Swelling of the abdomen that is new, acute  Fever of 100F or higher  For urgent or emergent issues, a gastroenterologist can be reached at any hour by calling [phone]. Do not use MyChart messaging for urgent concerns.    DIET:  We do recommend a small meal at first, but then you may proceed to your regular diet.  Drink plenty of fluids but you should avoid alcoholic beverages for 24 hours.  ACTIVITY:  You should plan to take it easy for the rest of today and you should NOT DRIVE  or use heavy machinery until tomorrow (because of the sedation medicines used during the test).    FOLLOW UP: Our staff will call the number listed on your records 48-72 hours following your procedure to check on you and address any questions or concerns that you may have regarding the information given to you following your procedure. If we do not reach you, we will leave a message.  We will attempt to reach you two times.  During this call, we will ask if you have developed any symptoms of COVID 19. If you develop any symptoms (ie: fever, flu-like symptoms, shortness of breath, cough etc.) before then, please call [phone].  If you test positive for Covid 19 in the 2 weeks post procedure, please call and report this information to us.    If any biopsies were taken you will be contacted by phone or by letter within the next 1-3 weeks.  Please call us at [phone] if you have not heard about the biopsies in 3 weeks.    SIGNATURES/CONFIDENTIALITY: You and/or your care partner have signed paperwork which will be entered into your electronic medical record.  These signatures attest to the fact that that the information above on your After Visit Summary has been reviewed and is understood.  Full responsibility of the confidentiality of this discharge information lies with you and/or your care-partner.  

## 2020-07-19 NOTE — Op Note (Signed)
[place] Patient Name: Tammy Hunt Procedure Date: 07/19/2020 2:04 PM MRN: [id number] Endoscopist: Jackquline Denmark , MD Age: 75 Referring MD:  Date of Birth: [birth year] Gender: Female Account #: [id number] Procedure:                Colonoscopy Indications:              High risk colon cancer surveillance: Personal                            history of colonic polyps Medicines:                Monitored Anesthesia Care Procedure:                Pre-Anesthesia Assessment:                           - Prior to the procedure, a History and Physical                            was performed, and patient medications and                            allergies were reviewed. The patient's tolerance of                            previous anesthesia was also reviewed. The risks                            and benefits of the procedure and the sedation                            options and risks were discussed with the patient.                            All questions were answered, and informed consent                            was obtained. Prior Anticoagulants: The patient has                            taken no previous anticoagulant or antiplatelet                            agents. ASA Grade Assessment: III - A patient with                            severe systemic disease. After reviewing the risks                            and benefits, the patient was deemed in                            satisfactory condition to undergo the procedure.  After obtaining informed consent, the colonoscope                            was passed under direct vision. Throughout the                            procedure, the patient's blood pressure, pulse, and                            oxygen saturations were monitored continuously. The                            Olympus PCF-H190DL (#[id number]) Colonoscope was                            introduced through the anus and advanced to  the the                            cecum, identified by appendiceal orifice and                            ileocecal valve. The colonoscopy was performed                            without difficulty. The patient tolerated the                            procedure well. The quality of the bowel                            preparation was good. The ileocecal valve,                            appendiceal orifice, and rectum were photographed. Scope In: 2:08:56 PM Scope Out: 2:23:35 PM Scope Withdrawal Time: 0 hours 9 minutes 12 seconds  Total Procedure Duration: 0 hours 14 minutes 39 seconds  Findings:                 A 4 mm polyp was found in the cecum. The polyp was                            sessile. The polyp was removed with a cold biopsy                            forceps. Resection and retrieval were complete.                           There was evidence of a prior end-to-end                            colo-colonic anastomosis in the sigmoid colon at 30                            cm from the anal  verge. This was patent and was                            characterized by healthy appearing mucosa.                           Multiple medium-mouthed diverticula were found in                            the distal sigmoid colon.                           Non-bleeding internal hemorrhoids were found during                            retroflexion. The hemorrhoids were small.                           The exam was otherwise without abnormality on                            direct and retroflexion views. Complications:            No immediate complications. Estimated Blood Loss:     Estimated blood loss: none. Impression:               - One 4 mm polyp in the cecum, removed with a cold                            biopsy forceps. Resected and retrieved.                           - Patent end-to-end colo-colonic anastomosis,                            characterized by healthy appearing mucosa.                            - Diverticulosis in the distal sigmoid colon.                           - Non-bleeding internal hemorrhoids.                           - The examination was otherwise normal on direct                            and retroflexion views. Recommendation:           - Patient has a contact number available for                            emergencies. The signs and symptoms of potential                            delayed complications were discussed with the  patient. Return to normal activities tomorrow.                            Written discharge instructions were provided to the                            patient.                           - Resume previous diet.                           - Continue present medications.                           - Await pathology results.                           - Repeat colonoscopy is not recommended for                            screening purposes d/t age.                           - Return to GI clinic PRN.                           - The findings and recommendations were discussed                            wih [name]. Jackquline Denmark, MD 07/19/2020 2:31:07 PM This report has been signed electronically.

## 2020-07-20 MED ORDER — EZETIMIBE 10 MG PO TABS: 10.0000 mg | ORAL_TABLET | Freq: Every day | ORAL | 3 refills | Status: DC

## 2020-07-21 ENCOUNTER — Telehealth: Payer: Self-pay

## 2020-07-21 NOTE — Telephone Encounter (Signed)
  Follow up Call-  Call back number 07/19/2020  Post procedure Call Back phone  # [phone]  Permission to leave phone message Yes  Some recent data might be hidden     Patient questions:  Do you have a fever, pain , or abdominal swelling? No. Pain Score  0 *  Have you tolerated food without any problems? Yes.    Have you been able to return to your normal activities? Yes.    Do you have any questions about your discharge instructions: Diet   No. Medications  No. Follow up visit  No.  Do you have questions or concerns about your Care? No.  Actions: * If pain score is 4 or above: No action needed, pain <4.  1. Have you developed a fever since your procedure? no  2.   Have you had an respiratory symptoms (SOB or cough) since your procedure? no  3.   Have you tested positive for COVID 19 since your procedure no  4.   Have you had any family members/close contacts diagnosed with the COVID 19 since your procedure?  no   If yes to any of these questions please route to Laverna Peace, RN and Karlton Lemon, RN

## 2020-07-27 NOTE — Telephone Encounter (Signed)
Tammy Hunt, Can we have Sheri or someone remove-personal history of colon cancer. I did notice that at the time of procedure. I know her well.  She just has history of polyps.  The colonoscopy report reflects it correctly  RG

## 2020-08-01 ENCOUNTER — Encounter: Payer: Self-pay | Admitting: Gastroenterology

## 2020-08-07 IMAGING — DX PORTABLE CHEST - 1 VIEW
1 series · 1 of 1 positions shown · non-contrast
Comparison: Chest x-ray 12/03/2018.

CLINICAL DATA: AVR.

EXAM:
PORTABLE CHEST 1 VIEW

[chest ap]
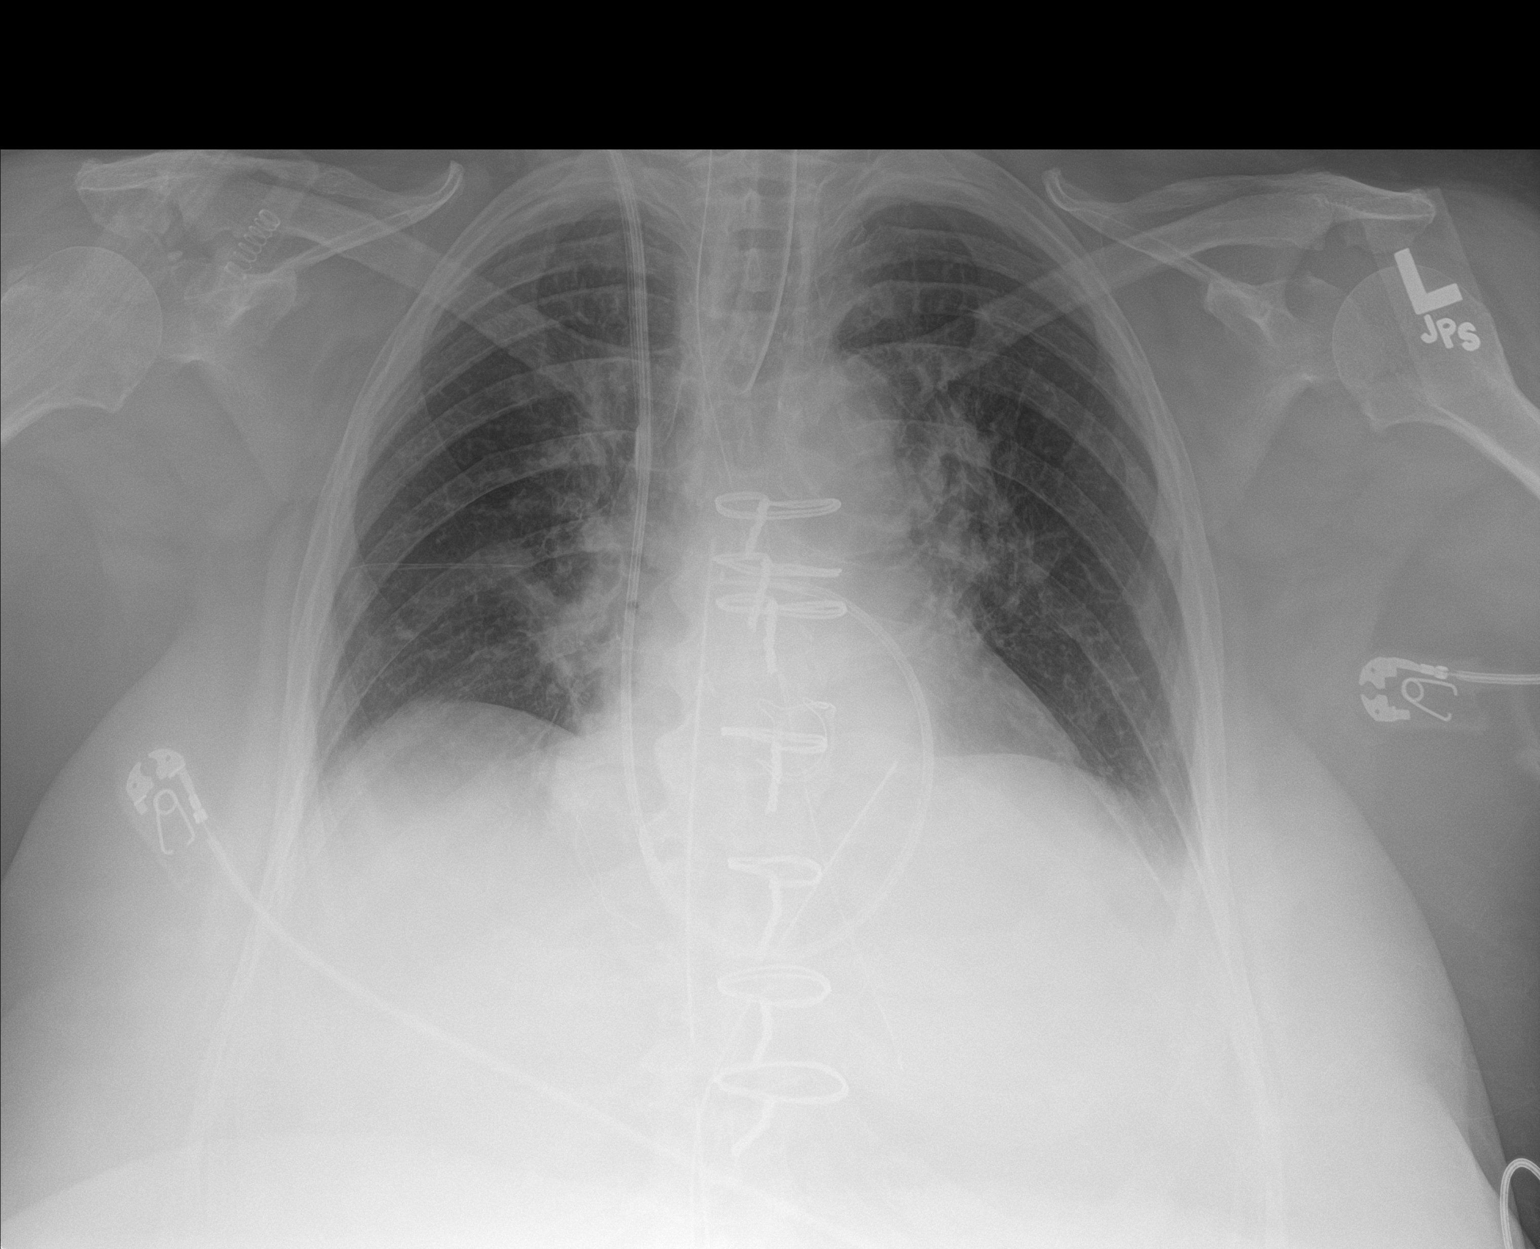

[1 of 1 positions shown; findings below may reference images not displayed]

FINDINGS: Endotracheal tube noted with its tip 2 cm above the carina. NG tube
noted with tip in the upper stomach. Mediastinal drainage catheter
over the mid mediastinum. Swan-Ganz catheter noted with tip over the
pulmonary outflow tract. Left chest tube noted with tip over left
lower chest. No pneumothorax. Mild bibasilar atelectasis. Small left
pleural effusion cannot be excluded. Prior cardiac valve
replacement. Heart size normal. Degenerative change thoracic spine.
IMPRESSION: Lines and tubes in good anatomic position as described above.

2. Prior cardiac valve replacement. Heart size normal. Mild
bibasilar atelectasis. Tiny left pleural effusion cannot be
excluded.

## 2020-08-08 IMAGING — DX PORTABLE CHEST - 1 VIEW
1 series · 1 of 1 positions shown · non-contrast
Comparison: Radiograph March 12, 2019.

CLINICAL DATA: Status post aortic valve repair.

EXAM:
PORTABLE CHEST 1 VIEW

[chest]
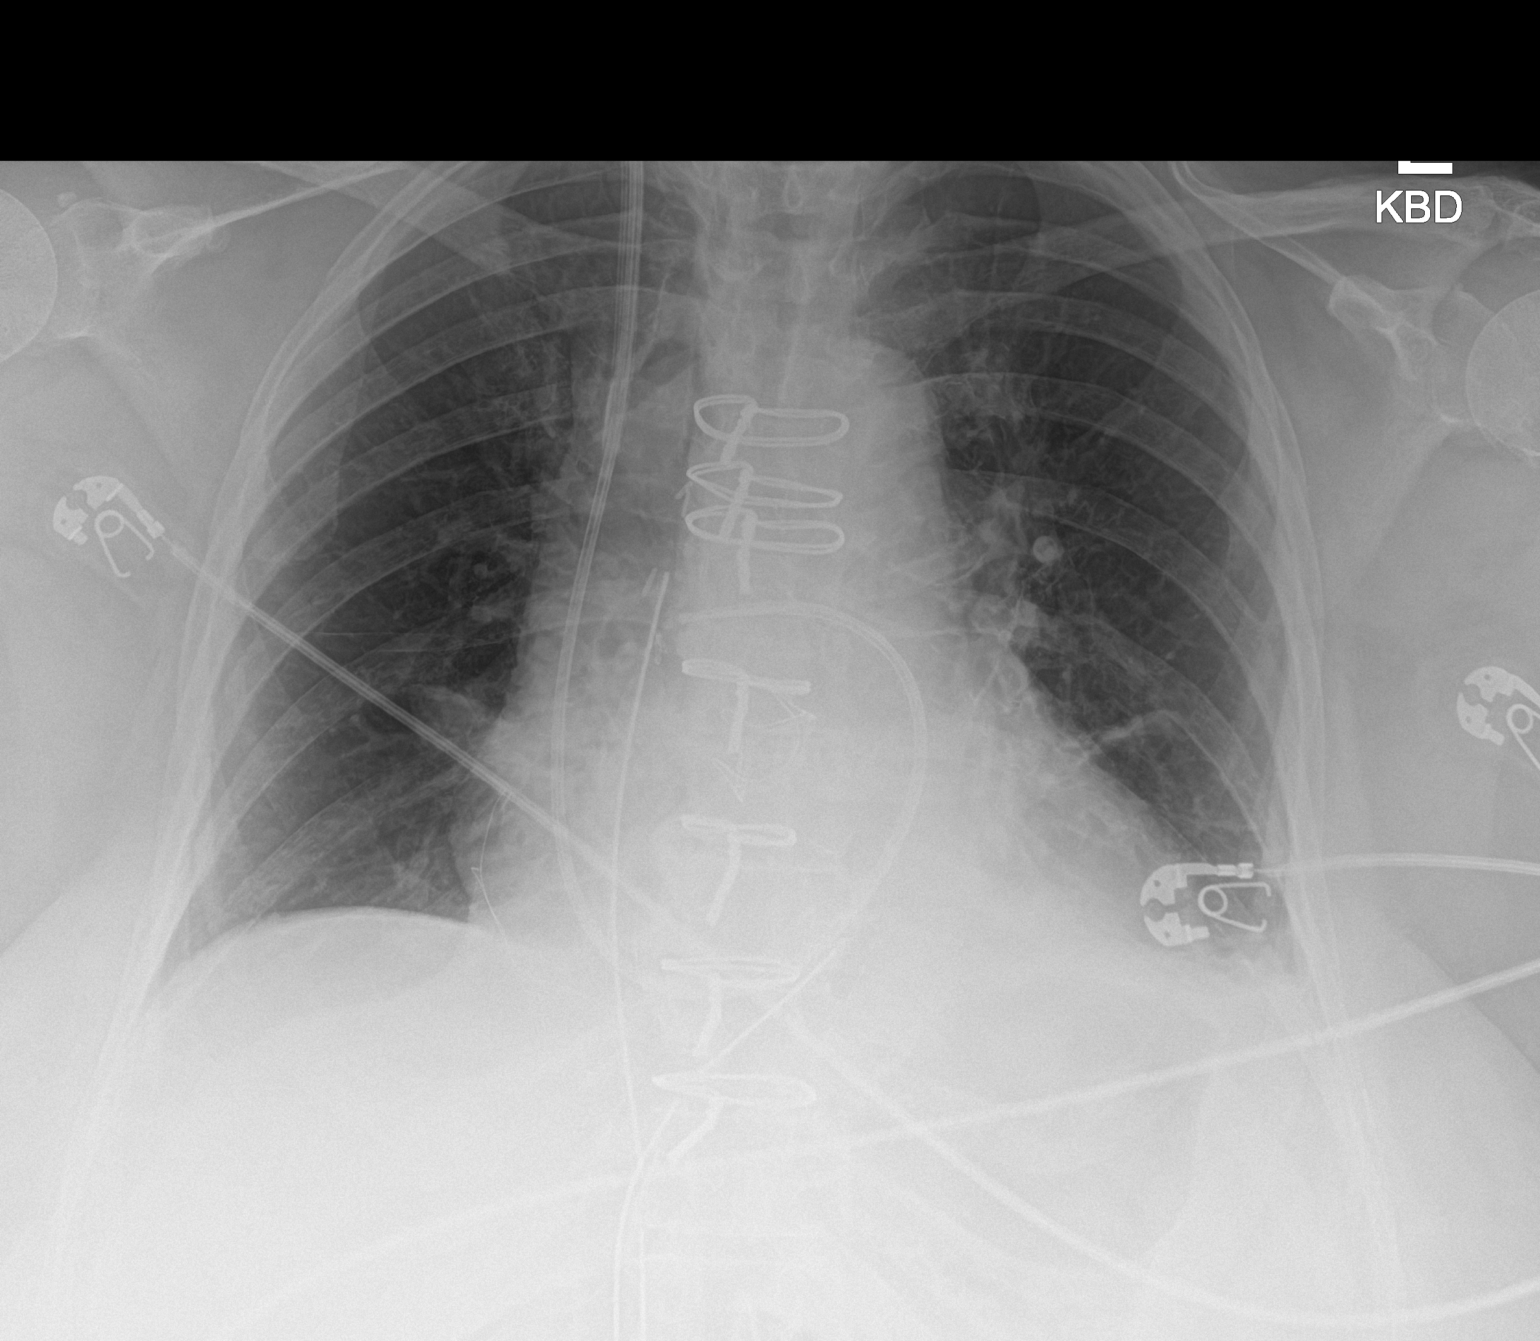

[1 of 1 positions shown; findings below may reference images not displayed]

FINDINGS: Stable cardiomediastinal silhouette. Aortic valve prosthesis is
noted. Endotracheal and nasogastric tubes have been removed. Right
internal jugular catheter Swan-Ganz catheter is noted with tip
directed toward right pulmonary artery. No pneumothorax is noted.
Right lung is clear. Mild left basilar subsegmental atelectasis is
noted with minimal left pleural effusion. Bony thorax is
unremarkable.
IMPRESSION: Endotracheal and nasogastric tubes have been removed. Mild left
basilar subsegmental atelectasis is noted with minimal left pleural
effusion.

## 2020-08-09 IMAGING — DX PORTABLE CHEST - 1 VIEW
1 series · 1 of 1 positions shown · non-contrast
Comparison: Chest x-ray from yesterday.

CLINICAL DATA: Recent AVR.

EXAM:
PORTABLE CHEST 1 VIEW

[chest ap]
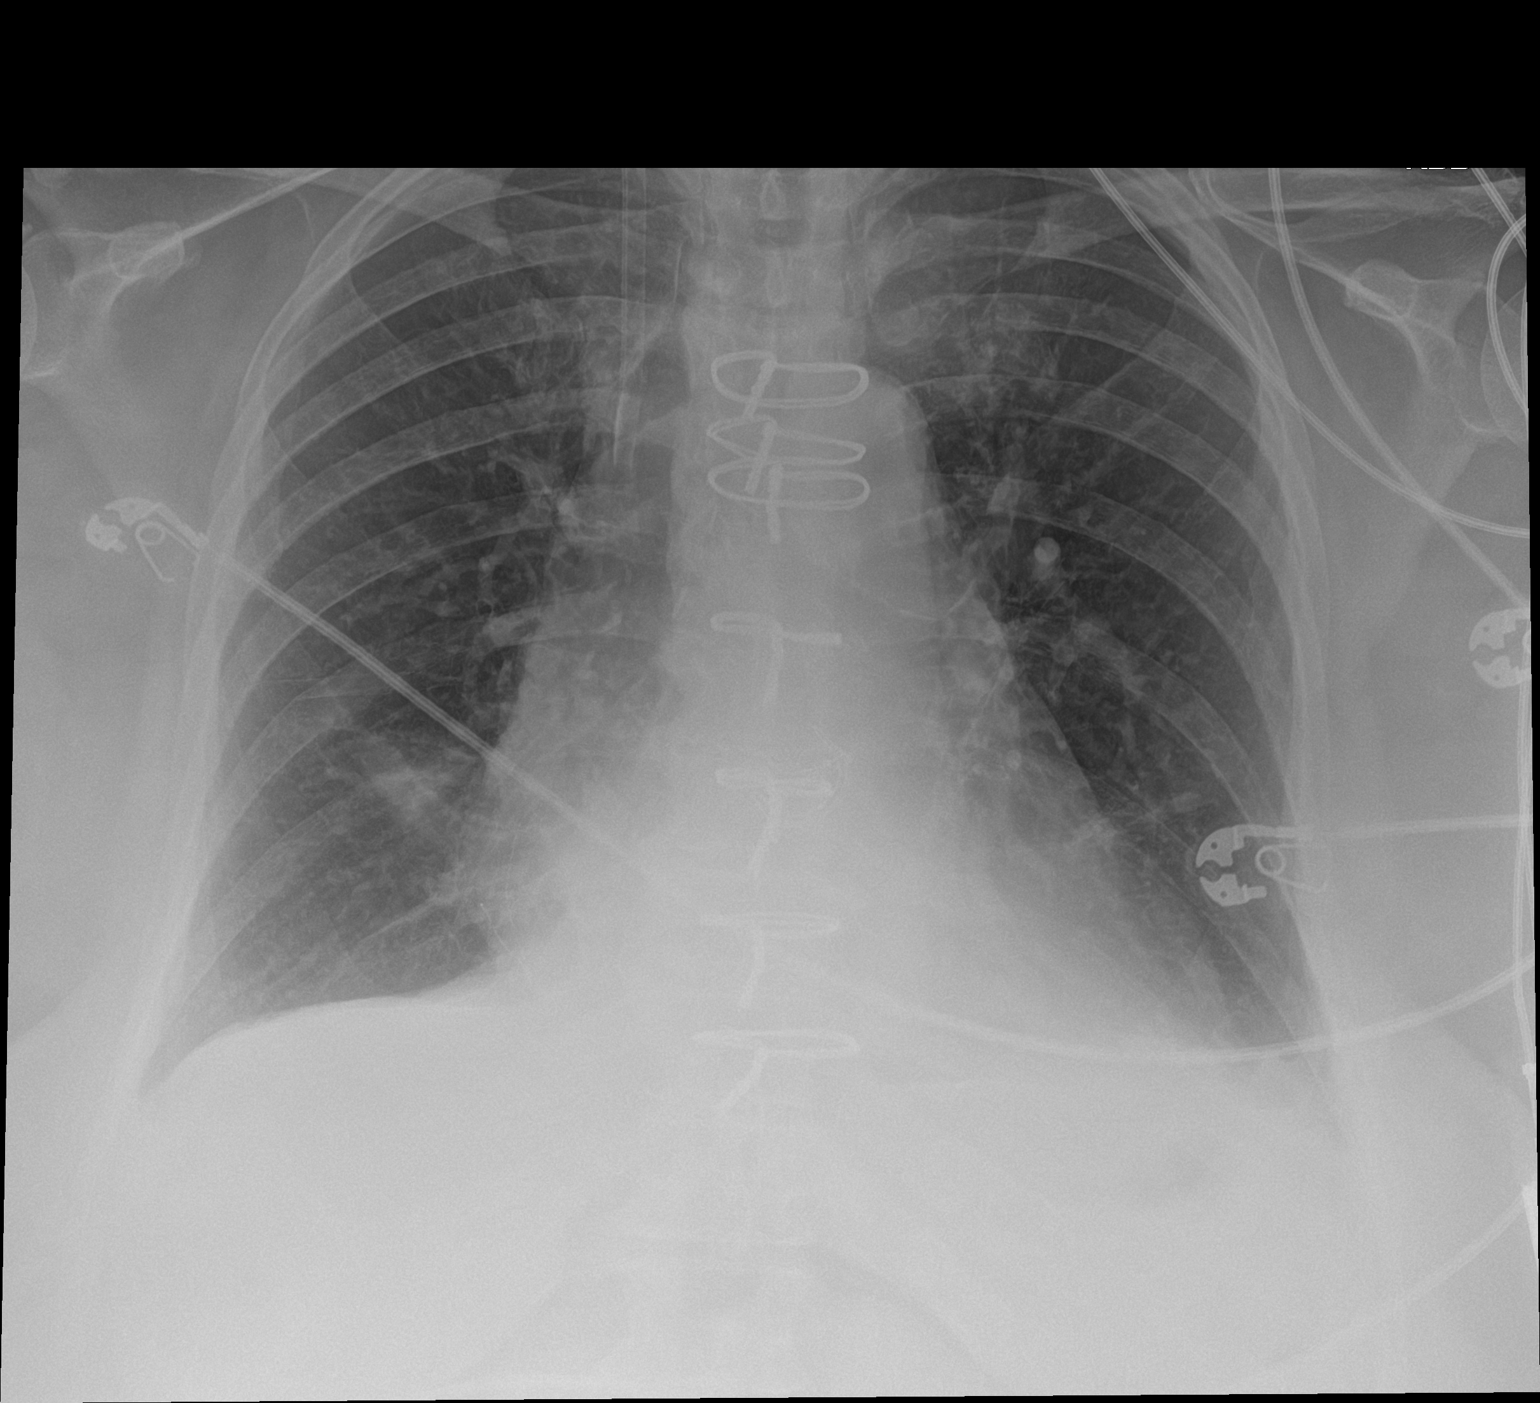

[1 of 1 positions shown; findings below may reference images not displayed]

FINDINGS: Interval removal of the Swan-Ganz catheter. The right internal
jugular sheath remains in place. Interval removal of the mediastinal
drains.

Stable mild cardiomegaly status post AVR. Normal pulmonary
vascularity. Unchanged small left pleural effusion with adjacent
mild basilar atelectasis. No pneumothorax. No acute osseous
abnormality.
IMPRESSION: 1. Unchanged small left pleural effusion with adjacent mild basilar
atelectasis.
2. Interval removal of the Swan-Ganz catheter and mediastinal
drains.

## 2020-08-10 IMAGING — DX CHEST - 2 VIEW
2 series · 2 of 2 positions shown · non-contrast
Comparison: 03/14/2019

CLINICAL DATA: Follow-up valve replacement surgery

EXAM:
CHEST - 2 VIEW

[chest pa]
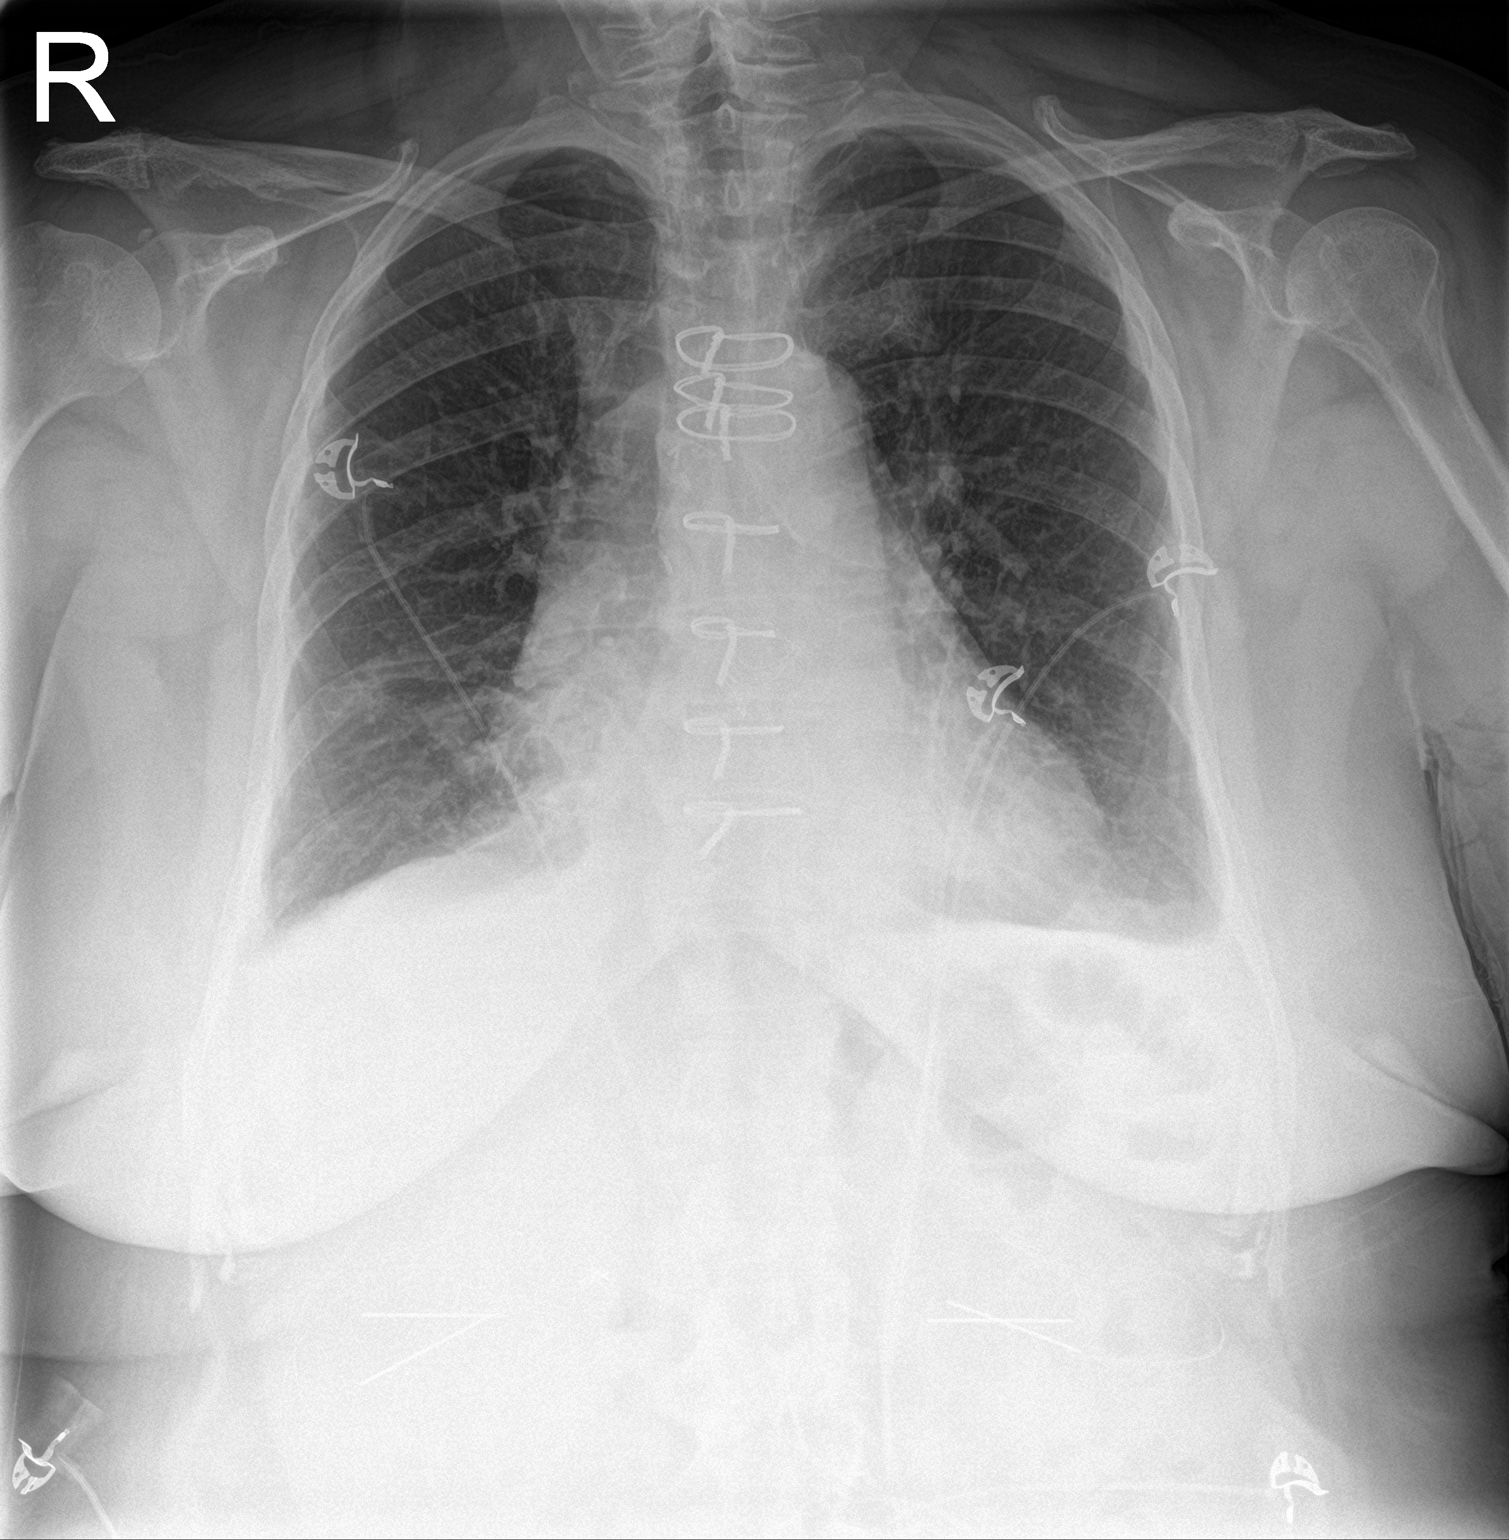

[chest lat]
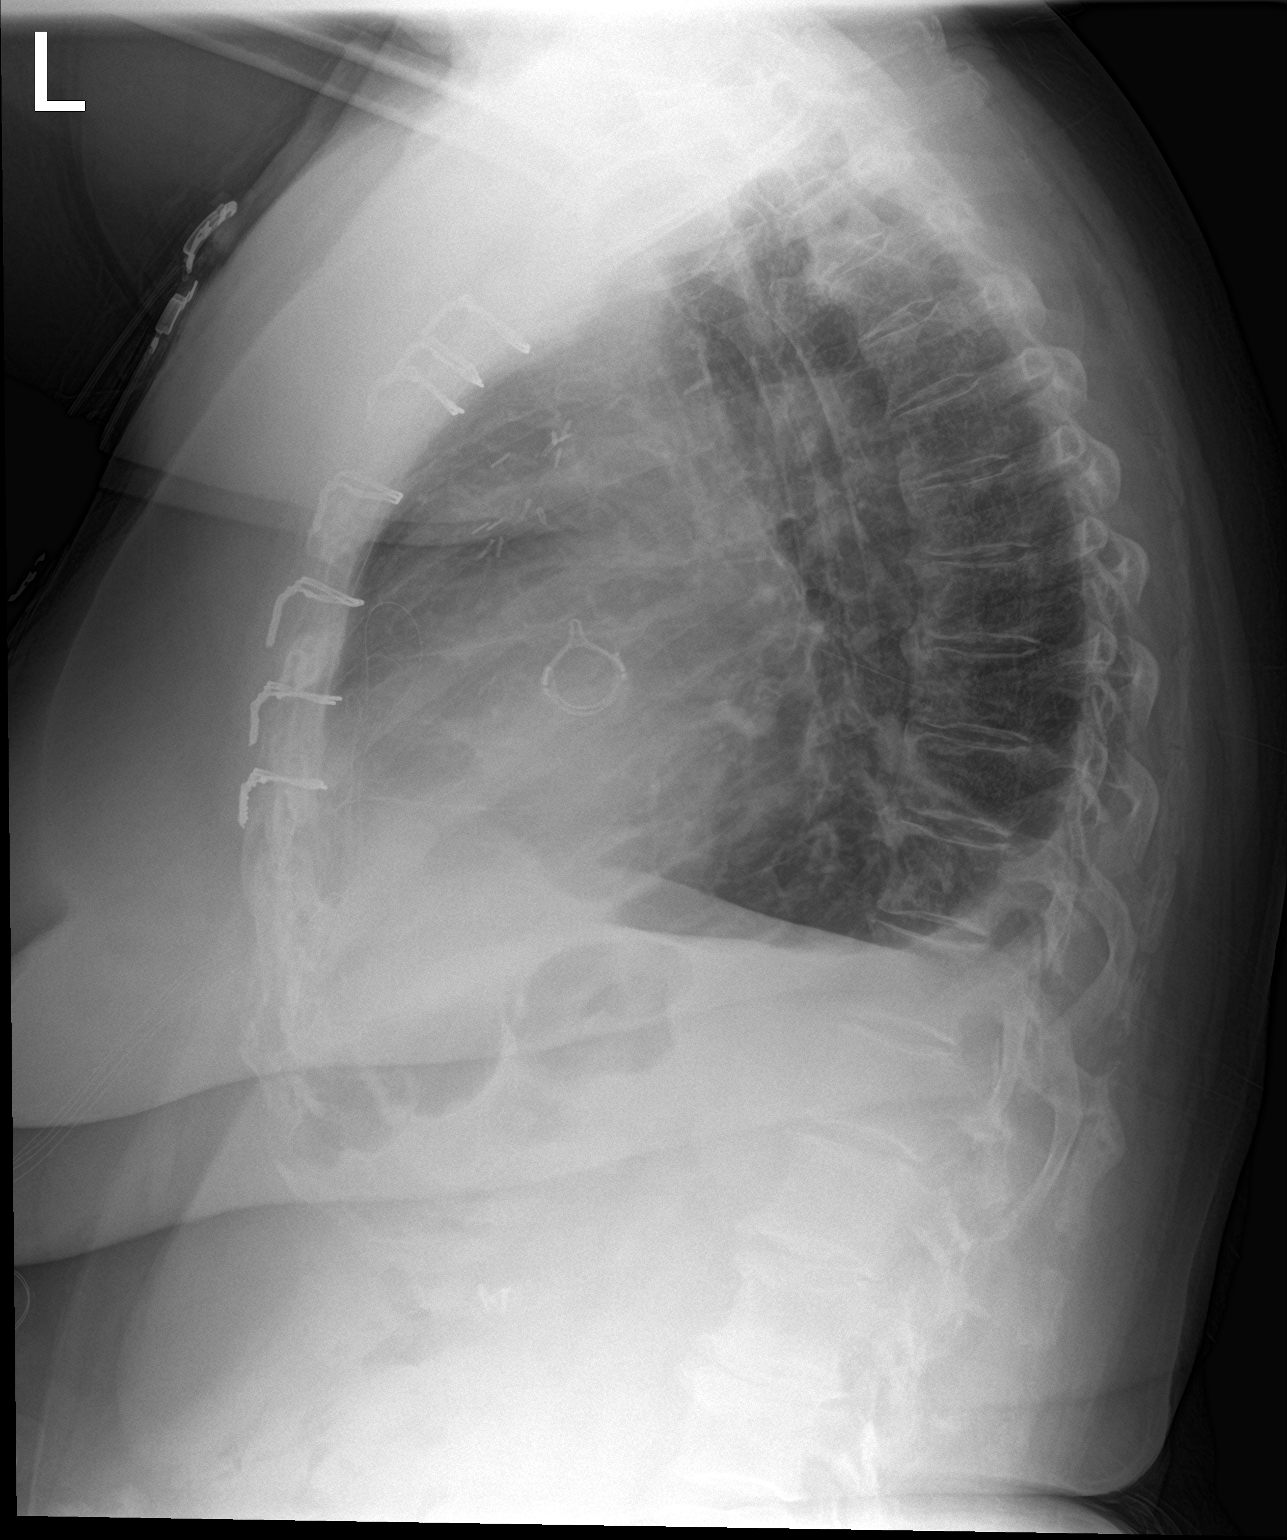

[2 of 2 positions shown; findings below may reference images not displayed]

FINDINGS: Previous median sternotomy and aortic valve replacement. Heart size
upper limits of normal. Right internal jugular venous access sheath
is been removed. Small amount of pleural fluid and minimal basilar
atelectasis persists. No pneumothorax.
IMPRESSION: Venous access sheath removed. Small amount of pleural fluid and
minimal basilar atelectasis. No pneumothorax.

## 2020-10-07 DIAGNOSIS — M25562 Pain in left knee: Secondary | ICD-10-CM | POA: Diagnosis not present

## 2020-10-07 DIAGNOSIS — M25652 Stiffness of left hip, not elsewhere classified: Secondary | ICD-10-CM | POA: Diagnosis not present

## 2020-10-07 DIAGNOSIS — M25561 Pain in right knee: Secondary | ICD-10-CM | POA: Diagnosis not present

## 2020-11-30 ENCOUNTER — Other Ambulatory Visit: Payer: Self-pay | Admitting: Cardiology

## 2020-11-30 DIAGNOSIS — Z952 Presence of prosthetic heart valve: Secondary | ICD-10-CM

## 2020-11-30 DIAGNOSIS — I2510 Atherosclerotic heart disease of native coronary artery without angina pectoris: Secondary | ICD-10-CM

## 2021-01-13 DIAGNOSIS — M1990 Unspecified osteoarthritis, unspecified site: Secondary | ICD-10-CM | POA: Diagnosis not present

## 2021-01-13 DIAGNOSIS — M79672 Pain in left foot: Secondary | ICD-10-CM | POA: Diagnosis not present

## 2021-01-13 DIAGNOSIS — E7800 Pure hypercholesterolemia, unspecified: Secondary | ICD-10-CM | POA: Diagnosis not present

## 2021-01-13 DIAGNOSIS — E042 Nontoxic multinodular goiter: Secondary | ICD-10-CM | POA: Diagnosis not present

## 2021-01-13 DIAGNOSIS — Z6841 Body Mass Index (BMI) 40.0 and over, adult: Secondary | ICD-10-CM | POA: Diagnosis not present

## 2021-01-13 DIAGNOSIS — Z79899 Other long term (current) drug therapy: Secondary | ICD-10-CM | POA: Diagnosis not present

## 2021-01-19 ENCOUNTER — Other Ambulatory Visit: Payer: Self-pay | Admitting: Cardiology

## 2021-01-23 DIAGNOSIS — E042 Nontoxic multinodular goiter: Secondary | ICD-10-CM | POA: Diagnosis not present

## 2021-02-08 DIAGNOSIS — Z1231 Encounter for screening mammogram for malignant neoplasm of breast: Secondary | ICD-10-CM | POA: Diagnosis not present

## 2021-02-09 ENCOUNTER — Encounter: Payer: Self-pay | Admitting: Gynecology

## 2021-02-13 DIAGNOSIS — Z6841 Body Mass Index (BMI) 40.0 and over, adult: Secondary | ICD-10-CM | POA: Diagnosis not present

## 2021-02-13 DIAGNOSIS — M7500 Adhesive capsulitis of unspecified shoulder: Secondary | ICD-10-CM | POA: Diagnosis not present

## 2021-02-13 DIAGNOSIS — E039 Hypothyroidism, unspecified: Secondary | ICD-10-CM | POA: Diagnosis not present

## 2021-02-14 DIAGNOSIS — M67911 Unspecified disorder of synovium and tendon, right shoulder: Secondary | ICD-10-CM | POA: Diagnosis not present

## 2021-02-14 DIAGNOSIS — M25512 Pain in left shoulder: Secondary | ICD-10-CM | POA: Diagnosis not present

## 2021-02-14 DIAGNOSIS — M67912 Unspecified disorder of synovium and tendon, left shoulder: Secondary | ICD-10-CM | POA: Diagnosis not present

## 2021-02-14 DIAGNOSIS — M25511 Pain in right shoulder: Secondary | ICD-10-CM | POA: Diagnosis not present

## 2021-02-16 DIAGNOSIS — R928 Other abnormal and inconclusive findings on diagnostic imaging of breast: Secondary | ICD-10-CM | POA: Diagnosis not present

## 2021-02-16 DIAGNOSIS — R922 Inconclusive mammogram: Secondary | ICD-10-CM | POA: Diagnosis not present

## 2021-03-06 DIAGNOSIS — M25562 Pain in left knee: Secondary | ICD-10-CM | POA: Diagnosis not present

## 2021-03-06 DIAGNOSIS — E039 Hypothyroidism, unspecified: Secondary | ICD-10-CM | POA: Diagnosis not present

## 2021-03-06 DIAGNOSIS — Z6841 Body Mass Index (BMI) 40.0 and over, adult: Secondary | ICD-10-CM | POA: Diagnosis not present

## 2021-03-14 DIAGNOSIS — Z23 Encounter for immunization: Secondary | ICD-10-CM | POA: Diagnosis not present

## 2021-03-28 DIAGNOSIS — M25562 Pain in left knee: Secondary | ICD-10-CM | POA: Diagnosis not present

## 2021-03-28 DIAGNOSIS — M1712 Unilateral primary osteoarthritis, left knee: Secondary | ICD-10-CM

## 2021-03-28 DIAGNOSIS — E669 Obesity, unspecified: Secondary | ICD-10-CM

## 2021-03-28 DIAGNOSIS — M170 Bilateral primary osteoarthritis of knee: Secondary | ICD-10-CM | POA: Diagnosis not present

## 2021-03-28 DIAGNOSIS — Z6841 Body Mass Index (BMI) 40.0 and over, adult: Secondary | ICD-10-CM | POA: Diagnosis not present

## 2021-03-28 HISTORY — DX: Unilateral primary osteoarthritis, left knee: M17.12

## 2021-03-28 HISTORY — DX: Obesity, unspecified: E66.9

## 2021-04-10 DIAGNOSIS — M1990 Unspecified osteoarthritis, unspecified site: Secondary | ICD-10-CM | POA: Diagnosis not present

## 2021-04-10 DIAGNOSIS — M25561 Pain in right knee: Secondary | ICD-10-CM | POA: Diagnosis not present

## 2021-04-10 DIAGNOSIS — M25562 Pain in left knee: Secondary | ICD-10-CM | POA: Diagnosis not present

## 2021-04-10 DIAGNOSIS — Z6841 Body Mass Index (BMI) 40.0 and over, adult: Secondary | ICD-10-CM | POA: Diagnosis not present

## 2021-04-25 DIAGNOSIS — Z23 Encounter for immunization: Secondary | ICD-10-CM | POA: Diagnosis not present

## 2021-05-02 NOTE — Progress Notes (Signed)
Cardiology Office Note:    Date:  05/03/2021   ID:  Tammy Hunt, DOB 1945/12/22, MRN [id number]  PCP:  Tammy Sheriff, MD  Cardiologist:  Tammy More, MD    Referring MD: Tammy Sheriff, MD    ASSESSMENT:    1. S/P AVR (aortic valve replacement)   2. Mixed hyperlipidemia   3. Mild nonobstructive coronary artery disease    PLAN:    In order of problems listed above:  Tammy Hunt continues to do well following TAVR.  On appropriate medical therapy including antiplatelet lipid-lowering statin plus Zetia recheck echocardiogram its been 2 years in my opinion she is optimized for planned total knee arthroplasty. Recheck lipid profile today in combination high intensity statin and Zetia Stable having no angina continue aspirin beta-blocker and lipid-lowering   Next appointment: 1 year   Medication Adjustments/Labs and Tests Ordered: Current medicines are reviewed at length with the patient today.  Concerns regarding medicines are outlined above.  No orders of the defined types were placed in this encounter.  No orders of the defined types were placed in this encounter.   Chief Complaint  Patient presents with   Annual Exam    History of Present Illness:    Tammy Hunt is a 75 y.o. female with a hx of  severe aortic stenosis status post surgical aortic valve replacement using a 21 InspirEase pericardial valve mild CAD.  She was last seen 07/05/2020.  Compliance with diet, lifestyle and medications: Yes  She anticipates total knee arthroplasty will be seeing Dr. Laurance Hunt in [facility] orthopedic joint surgeon. Her BMI is 46 and she understands the necessity of weight loss and is working very hard to achieve goal She has had a good response to TAVR and is not having cardiovascular symptoms of edema shortness of breath orthopnea chest pain palpitation or syncope. Her lipid treatment was intensified with the addition of Zetia recheck lipid profile today. He tolerates his  statin without muscle pain or weakness Exercise tolerance is greater than 4 METS Past Medical History:  Diagnosis Date   Anxiety    Arthritis    COVID-19 virus infection 02/2020   had vaccine in March 2021 and breakthough in 02/2020 and the antibodiy infusion    Depression    Diverticulitis    Heart murmur    Hyperlipidemia    Mild nonobstructive coronary artery disease    (a) 02/13/19 cath with 40% stenosis to LAD and RCA - mild nonobstructive disease rec for medical mgmt   Mitral valve regurgitation    Osteopenia    Palpitations    Papilloma of breast    S/P AVR (aortic valve replacement) 03/12/2019   (a) SAVR with 30mm [place] valve   Severe aortic stenosis    s/p SAVR 03/12/19   Thyroid disease     Past Surgical History:  Procedure Laterality Date   AORTIC VALVE REPLACEMENT N/A 03/12/2019   Procedure: AORTIC VALVE REPLACEMENT (AVR);  Surgeon: Gaye Pollack, MD;  Location: [facility];  Service: Open Heart Surgery;  Laterality: N/A;   BIOPSY THYROID  JAN 2015   BOWEL RESECTION     BREAST SURGERY     Left breast   CESAREAN SECTION     CHOLECYSTECTOMY     COLONOSCOPY  07/26/2014   Colonic polyps statuas post polypectomy. Mild neo diverticulosis. External hemorrhoids.    COLOSTOMY TAKEDOWN     CORONARY ANGIOPLASTY     FINGER SURGERY  LEFT HEART CATH AND CORONARY ANGIOGRAPHY N/A 02/13/2019   Procedure: LEFT HEART CATH AND CORONARY ANGIOGRAPHY;  Surgeon: Sherren Mocha, MD;  Location: [facility];  Service: Cardiovascular;  Laterality: N/A;   TEE WITHOUT CARDIOVERSION N/A 03/12/2019   Procedure: TRANSESOPHAGEAL ECHOCARDIOGRAM (TEE);  Surgeon: Gaye Pollack, MD;  Location: [facility];  Service: Open Heart Surgery;  Laterality: N/A;   TUBAL LIGATION      Current Medications: Current Meds  Medication Sig   aspirin EC 81 MG tablet Take 81 mg by mouth daily. Swallow whole.   buPROPion (WELLBUTRIN XL) 300 MG 24 hr tablet Take 300 mg by mouth daily.     Calcium Carbonate-Vitamin D (CALCIUM 600/VITAMIN D PO) Take 1 tablet by mouth daily.   clonazePAM (KLONOPIN) 0.5 MG tablet Take 0.5 mg by mouth daily as needed for anxiety.    ezetimibe (ZETIA) 10 MG tablet Take 1 tablet (10 mg total) by mouth daily.   levothyroxine (SYNTHROID, LEVOTHROID) 75 MCG tablet Take 1 tablet (75 mcg total) by mouth daily before breakfast.   Magnesium 500 MG TABS Take 500 mg by mouth daily.    metoprolol tartrate (LOPRESSOR) 25 MG tablet TAKE 1/2 TABLET BY MOUTH TWICE DAILY.   Omega-3 Fatty Acids (FISH OIL) 1000 MG CAPS Take 2,000 mg by mouth 2 (two) times daily.    polyethylene glycol (MIRALAX / GLYCOLAX) 17 g packet Take 17 g by mouth daily.   rosuvastatin (CRESTOR) 10 MG tablet TAKE 1 TABLET BY MOUTH ONCE DAILY   Vitamin D, Cholecalciferol, 50 MCG (2000 UT) CAPS Take 2,000 Units by mouth daily.      Allergies:   Patient has no known allergies.   Social History   Socioeconomic History   Marital status: Married    Spouse name: Not on file   Number of children: Not on file   Years of education: Not on file   Highest education level: Not on file  Occupational History   Not on file  Tobacco Use   Smoking status: Former    Types: Cigarettes    Quit date: 07/19/2005    Years since quitting: 15.8   Smokeless tobacco: Never  Vaping Use   Vaping Use: Never used  Substance and Sexual Activity   Alcohol use: No   Drug use: No   Sexual activity: Not Currently    Birth control/protection: Surgical  Other Topics Concern   Not on file  Social History Narrative   Not on file   Social Determinants of Health   Financial Resource Strain: Not on file  Food Insecurity: Not on file  Transportation Needs: Not on file  Physical Activity: Not on file  Stress: Not on file  Social Connections: Not on file     Family History: The patient's family history includes Breast cancer in her maternal aunt; Cancer in her maternal aunt, maternal grandmother, maternal  uncle, and mother; Heart disease in her father; Lung cancer in her mother. There is no history of Colon cancer. ROS:   Please see the history of present illness.    All other systems reviewed and are negative.  EKGs/Labs/Other Studies Reviewed:    The following studies were reviewed today:    Recent Labs: 07/05/2020: ALT 28; BUN 17; Creatinine, Ser 0.97; Potassium 5.1; Sodium 140  Recent Lipid Panel    Component Value Date/Time   CHOL 241 (H) 07/05/2020 1439   TRIG 176 (H) 07/05/2020 1439   HDL 60 07/05/2020 1439   CHOLHDL 4.0  07/05/2020 1439   LDLCALC 149 (H) 07/05/2020 1439    Physical Exam:    VS:  BP 140/90 (BP Location: Right Arm, Patient Position: Sitting, Cuff Size: Normal)   Pulse 73   Ht 4\' 10"  (1.473 m)   Wt 217 lb (98.4 kg)   SpO2 98%   BMI 45.35 kg/m     Wt Readings from Last 3 Encounters:  05/03/21 217 lb (98.4 kg)  07/19/20 227 lb (103 kg)  07/05/20 227 lb (103 kg)     GEN:  Well nourished, well developed in no acute distress HEENT: Normal NECK: No JVD; No carotid bruits LYMPHATICS: No lymphadenopathy CARDIAC: Grade 1/6 soft midsystolic ejection murmur aortic area no aortic regurgitation RRR, no murmurs, rubs, gallops RESPIRATORY:  Clear to auscultation without rales, wheezing or rhonchi  ABDOMEN: Soft, non-tender, non-distended MUSCULOSKELETAL:  No edema; No deformity  SKIN: Warm and dry NEUROLOGIC:  Alert and oriented x 3 PSYCHIATRIC:  Normal affect    Signed, Tammy More, MD  05/03/2021 9:56 AM    [place]

## 2021-05-03 ENCOUNTER — Ambulatory Visit: Payer: Medicare PPO | Admitting: Cardiology

## 2021-05-03 ENCOUNTER — Other Ambulatory Visit: Payer: Self-pay

## 2021-05-03 ENCOUNTER — Encounter: Payer: Self-pay | Admitting: Cardiology

## 2021-05-03 VITALS — BP 140/90 | HR 73 | Ht <= 58 in | Wt 217.0 lb

## 2021-05-03 DIAGNOSIS — I2510 Atherosclerotic heart disease of native coronary artery without angina pectoris: Secondary | ICD-10-CM

## 2021-05-03 DIAGNOSIS — Z952 Presence of prosthetic heart valve: Secondary | ICD-10-CM

## 2021-05-03 DIAGNOSIS — E782 Mixed hyperlipidemia: Secondary | ICD-10-CM

## 2021-05-03 NOTE — Patient Instructions (Signed)
Medication Instructions:  Your physician recommends that you continue on your current medications as directed. Please refer to the Current Medication list given to you today.  *If you need a refill on your cardiac medications before your next appointment, please call your pharmacy*   Lab Work: Your physician recommends that you return for lab work in: [place], Lipids If you have labs (blood work) drawn today and your tests are completely normal, you will receive your results only by: [place] (if you have MyChart) OR A paper copy in the mail If you have any lab test that is abnormal or we need to change your treatment, we will call you to review the results.   Testing/Procedures: Your physician has requested that you have an echocardiogram. Echocardiography is a painless test that uses sound waves to create images of your heart. It provides your doctor with information about the size and shape of your heart and how well your heart's chambers and valves are working. This procedure takes approximately one hour. There are no restrictions for this procedure.    Follow-Up: At [facility], you and your health needs are our priority.  As part of our continuing mission to provide you with exceptional heart care, we have created designated Provider Care Teams.  These Care Teams include your primary Cardiologist (physician) and Advanced Practice Providers (APPs -  Physician Assistants and Nurse Practitioners) who all work together to provide you with the care you need, when you need it.  We recommend signing up for the patient portal called "MyChart".  Sign up information is provided on this After Visit Summary.  MyChart is used to connect with patients for Virtual Visits (Telemedicine).  Patients are able to view lab/test results, encounter notes, upcoming appointments, etc.  Non-urgent messages can be sent to your provider as well.   To learn more about what you can do with MyChart, go to  [email or web address].    Your next appointment:   1 year(s)  The format for your next appointment:   In Person  Provider:   Shirlee More, MD   Other Instructions

## 2021-05-04 ENCOUNTER — Ambulatory Visit (INDEPENDENT_AMBULATORY_CARE_PROVIDER_SITE_OTHER): Payer: Medicare PPO

## 2021-05-04 ENCOUNTER — Telehealth: Payer: Self-pay

## 2021-05-04 DIAGNOSIS — Z952 Presence of prosthetic heart valve: Secondary | ICD-10-CM

## 2021-05-04 LAB — COMPREHENSIVE METABOLIC PANEL
ALT: 26 IU/L (ref 0–32)
AST: 19 IU/L (ref 0–40)
Albumin/Globulin Ratio: 2.3 — ABNORMAL HIGH (ref 1.2–2.2)
Albumin: 4.4 g/dL (ref 3.7–4.7)
Alkaline Phosphatase: 67 IU/L (ref 44–121)
BUN/Creatinine Ratio: 12 (ref 12–28)
BUN: 13 mg/dL (ref 8–27)
Bilirubin Total: 0.5 mg/dL (ref 0.0–1.2)
CO2: 23 mmol/L (ref 20–29)
Calcium: 10 mg/dL (ref 8.7–10.3)
Chloride: 105 mmol/L (ref 96–106)
Creatinine, Ser: 1.12 mg/dL — ABNORMAL HIGH (ref 0.57–1.00)
Globulin, Total: 1.9 g/dL (ref 1.5–4.5)
Glucose: 96 mg/dL (ref 70–99)
Potassium: 4.5 mmol/L (ref 3.5–5.2)
Sodium: 142 mmol/L (ref 134–144)
Total Protein: 6.3 g/dL (ref 6.0–8.5)
eGFR: 52 mL/min/{1.73_m2} — ABNORMAL LOW (ref >59)

## 2021-05-04 LAB — LIPID PANEL
Chol/HDL Ratio: 2.9 ratio (ref 0.0–4.4)
Cholesterol, Total: 193 mg/dL (ref 100–199)
HDL: 67 mg/dL (ref >39)
LDL Chol Calc (NIH): 107 mg/dL — ABNORMAL HIGH (ref 0–99)
Triglycerides: 107 mg/dL (ref 0–149)
VLDL Cholesterol Cal: 19 mg/dL (ref 5–40)

## 2021-05-04 LAB — ECHOCARDIOGRAM COMPLETE
AR max vel: 1.23 cm2
AV Area VTI: 1.23 cm2
AV Area mean vel: 1.31 cm2
AV Mean grad: 17.5 mmHg
AV Peak grad: 33.5 mmHg
Ao pk vel: 2.9 m/s
Area-P 1/2: 2.95 cm2
S' Lateral: 3.4 cm

## 2021-05-04 NOTE — Telephone Encounter (Signed)
Spoke with patient regarding results and recommendation.  Patient verbalizes understanding and is agreeable to plan of care. Advised patient to call back with any issues or concerns.  

## 2021-05-04 NOTE — Telephone Encounter (Signed)
-----   Message from Richardo Priest, MD sent at 05/04/2021  7:33 AM EDT ----- Normal or stable result  No changes

## 2021-05-05 ENCOUNTER — Telehealth: Payer: Self-pay

## 2021-05-05 NOTE — Telephone Encounter (Signed)
-----   Message from Richardo Priest, MD sent at 05/05/2021 10:32 AM EDT ----- Normal or stable result  Good result TAVR function is normal

## 2021-05-05 NOTE — Telephone Encounter (Signed)
Spoke with patient regarding results and recommendation.  Patient verbalizes understanding and is agreeable to plan of care. Advised patient to call back with any issues or concerns.  

## 2021-05-24 ENCOUNTER — Other Ambulatory Visit: Payer: Self-pay | Admitting: Cardiology

## 2021-05-24 DIAGNOSIS — I2510 Atherosclerotic heart disease of native coronary artery without angina pectoris: Secondary | ICD-10-CM

## 2021-05-24 DIAGNOSIS — Z952 Presence of prosthetic heart valve: Secondary | ICD-10-CM

## 2021-08-01 ENCOUNTER — Other Ambulatory Visit: Payer: Self-pay | Admitting: Cardiology

## 2021-08-07 DIAGNOSIS — M25561 Pain in right knee: Secondary | ICD-10-CM | POA: Diagnosis not present

## 2021-08-07 DIAGNOSIS — Z6841 Body Mass Index (BMI) 40.0 and over, adult: Secondary | ICD-10-CM | POA: Diagnosis not present

## 2021-08-07 DIAGNOSIS — F331 Major depressive disorder, recurrent, moderate: Secondary | ICD-10-CM | POA: Diagnosis not present

## 2021-08-07 DIAGNOSIS — M25562 Pain in left knee: Secondary | ICD-10-CM | POA: Diagnosis not present

## 2021-08-07 DIAGNOSIS — E039 Hypothyroidism, unspecified: Secondary | ICD-10-CM | POA: Diagnosis not present

## 2021-08-08 DIAGNOSIS — Z0000 Encounter for general adult medical examination without abnormal findings: Secondary | ICD-10-CM | POA: Diagnosis not present

## 2021-11-02 ENCOUNTER — Other Ambulatory Visit: Payer: Self-pay | Admitting: Cardiology

## 2021-11-02 DIAGNOSIS — M1712 Unilateral primary osteoarthritis, left knee: Secondary | ICD-10-CM | POA: Diagnosis not present

## 2021-11-02 DIAGNOSIS — M25562 Pain in left knee: Secondary | ICD-10-CM | POA: Diagnosis not present

## 2021-11-20 ENCOUNTER — Telehealth: Payer: Self-pay

## 2021-11-20 ENCOUNTER — Telehealth: Payer: Self-pay | Admitting: *Deleted

## 2021-11-20 NOTE — Telephone Encounter (Signed)
Pt is scheduled for telephone clearance on 5/10. Consent in chart. Medications reconciled.  ? ?  ?Patient Consent for Virtual Visit  ? ? ?   ? ?Tammy Hunt has provided verbal consent on 11/20/2021 for a virtual visit (video or telephone). ? ? ?CONSENT FOR VIRTUAL VISIT FOR:  Tammy Hunt  ?By participating in this virtual visit I agree to the following: ? ?I hereby voluntarily request, consent and authorize [facility] and its employed or contracted physicians, physician assistants, nurse practitioners or other licensed health care professionals (the Practitioner), to provide me with telemedicine health care services (the ?Services") as deemed necessary by the treating Practitioner. I acknowledge and consent to receive the Services by the Practitioner via telemedicine. I understand that the telemedicine visit will involve communicating with the Practitioner through live audiovisual communication technology and the disclosure of certain medical information by electronic transmission. I acknowledge that I have been given the opportunity to request an in-person assessment or other available alternative prior to the telemedicine visit and am voluntarily participating in the telemedicine visit. ? ?I understand that I have the right to withhold or withdraw my consent to the use of telemedicine in the course of my care at any time, without affecting my right to future care or treatment, and that the Practitioner or I may terminate the telemedicine visit at any time. I understand that I have the right to inspect all information obtained and/or recorded in the course of the telemedicine visit and may receive copies of available information for a reasonable fee.  I understand that some of the potential risks of receiving the Services via telemedicine include:  ?Delay or interruption in medical evaluation due to technological equipment failure or disruption; ?Information transmitted may not be sufficient (e.g. poor  resolution of images) to allow for appropriate medical decision making by the Practitioner; and/or  ?In rare instances, security protocols could fail, causing a breach of personal health information. ? ?Furthermore, I acknowledge that it is my responsibility to provide information about my medical history, conditions and care that is complete and accurate to the best of my ability. I acknowledge that Practitioner's advice, recommendations, and/or decision may be based on factors not within their control, such as incomplete or inaccurate data provided by me or distortions of diagnostic images or specimens that may result from electronic transmissions. I understand that the practice of medicine is not an exact science and that Practitioner makes no warranties or guarantees regarding treatment outcomes. I acknowledge that a copy of this consent can be made available to me via my patient portal ([facility]), or I can request a printed copy by calling the office of [facility].   ? ?I understand that my insurance will be billed for this visit.  ? ?I have read or had this consent read to me. ?I understand the contents of this consent, which adequately explains the benefits and risks of the Services being provided via telemedicine.  ?I have been provided ample opportunity to ask questions regarding this consent and the Services and have had my questions answered to my satisfaction. ?I give my informed consent for the services to be provided through the use of telemedicine in my medical care ? ?  ?

## 2021-11-20 NOTE — Telephone Encounter (Signed)
? ? ?  Name: Tammy Hunt  ?DOB: 02-24-1946  ?MRN: [id number] ? ?Primary Cardiologist: Shirlee More, MD ? ? ?Preoperative team, please contact this patient and set up a phone call appointment for further preoperative risk assessment. Please obtain consent and complete medication review. Thank you for your help. ? ?I confirm that guidance regarding antiplatelet and oral anticoagulation therapy has been completed and, if necessary, noted below. ? ? ? ?Leanor Kail, PA ?11/20/2021, 10:25 AM ?[facility] [address] ?[place], [place] [address] ?  ?

## 2021-11-20 NOTE — Telephone Encounter (Signed)
Pt returning call. Transferred to [facility].  ?

## 2021-11-20 NOTE — Telephone Encounter (Signed)
? ?  Pre-operative Risk Assessment  ?  ?Patient Name: Tammy Hunt  ?DOB: 1946-07-07 ?MRN: [id number]  ? ?  ? ?Request for Surgical Clearance   ? ?Procedure:   Total Knee Arthroplasty ? ?Date of Surgery:  Clearance TBD                              ?   ?Surgeon:  Doreene Burke, MD ?Surgeon's Group or Practice Name:  [facility] ?Phone number:  [phone] ?Fax number:  [phone] ?  ?Type of Clearance Requested:   ?- Medical  ?  ?Type of Anesthesia:  Spinal and adductor block for pain ?  ?Additional requests/questions:   Surgeons office will complete pre-operative testing in their office only if these test are not performed by our office or are out of date per hospital guidelines ? ?Signed, ?Toni Arthurs   ?11/20/2021, 9:58 AM   ?

## 2021-11-20 NOTE — Telephone Encounter (Signed)
Lmtcb to set up phone visit.  ?

## 2021-11-22 ENCOUNTER — Ambulatory Visit (INDEPENDENT_AMBULATORY_CARE_PROVIDER_SITE_OTHER): Payer: Medicare PPO | Admitting: Physician Assistant

## 2021-11-22 DIAGNOSIS — Z01810 Encounter for preprocedural cardiovascular examination: Secondary | ICD-10-CM | POA: Diagnosis not present

## 2021-11-22 NOTE — Progress Notes (Signed)
Per Almyra Deforest, PAC, pt would like a copy of clearance notes to be mailed to her. PAC confirmed the mailing address with the pt. I have placed the clearance notes in the mail to the pt.  ?

## 2021-11-22 NOTE — Progress Notes (Signed)
? ?Virtual Visit via Telephone Note  ? ?This visit type was conducted due to national recommendations for restrictions regarding the COVID-19 Pandemic (e.g. social distancing) in an effort to limit this patient's exposure and mitigate transmission in our community.  Due to her co-morbid illnesses, this patient is at least at moderate risk for complications without adequate follow up.  This format is felt to be most appropriate for this patient at this time.  The patient did not have access to video technology/had technical difficulties with video requiring transitioning to audio format only (telephone).  All issues noted in this document were discussed and addressed.  No physical exam could be performed with this format.  Please refer to the patient's chart for her  consent to telehealth for [facility]. ? ?Evaluation Performed:  Preoperative cardiovascular risk assessment ?_____________  ? ?Date:  11/22/2021  ? ?Patient ID:  Tammy Hunt, Tammy Hunt Dec 04, 1945, MRN [id number] ?Patient Location:  ?Home ?Provider location:   ?Office ? ?Primary Care Provider:  Angelina Sheriff, MD ?Primary Cardiologist:  Shirlee More, MD ? ?Chief Complaint  ?  ?76 y.o. y/o female with a h/o severe aortic stenosis s/p bioprosthetic AVR, mild CAD on cath 01/2019, hyperlipidemia and thyroid disease, who is pending total knee arthroplasty by Dr. Beckey Rutter of [facility], and presents today for telephonic preoperative cardiovascular risk assessment. ? ?Past Medical History  ?  ?Past Medical History:  ?Diagnosis Date  ? Anxiety   ? Arthritis   ? COVID-19 virus infection 02/2020  ? had vaccine in March 2021 and breakthough in 02/2020 and the antibodiy infusion   ? Depression   ? Diverticulitis   ? Heart murmur   ? Hyperlipidemia   ? Mild nonobstructive coronary artery disease   ? (a) 02/13/19 cath with 40% stenosis to LAD and RCA - mild nonobstructive disease rec for medical mgmt  ? Mitral valve regurgitation   ? Osteopenia   ?  Palpitations   ? Papilloma of breast   ? S/P AVR (aortic valve replacement) 03/12/2019  ? (a) SAVR with 55[place] valve  ? Severe aortic stenosis   ? s/p SAVR 03/12/19  ? Thyroid disease   ? ?Past Surgical History:  ?Procedure Laterality Date  ? AORTIC VALVE REPLACEMENT N/A 03/12/2019  ? Procedure: AORTIC VALVE REPLACEMENT (AVR);  Surgeon: BGaye Pollack MD;  [facility]  Service: Open Heart Surgery;  Laterality: N/A;  ? BIOPSY THYROID  JAN 2015  ? BOWEL RESECTION    ? BREAST SURGERY    ? Left breast  ? CESAREAN SECTION    ? CHOLECYSTECTOMY    ? COLONOSCOPY  07/26/2014  ? Colonic polyps statuas post polypectomy. Mild neo diverticulosis. External hemorrhoids.   ? COLOSTOMY TAKEDOWN    ? CORONARY ANGIOPLASTY    ? FINGER SURGERY    ? LEFT HEART CATH AND CORONARY ANGIOGRAPHY N/A 02/13/2019  ? Procedure: LEFT HEART CATH AND CORONARY ANGIOGRAPHY;  Surgeon: CSherren Mocha MD;  [facility];  Service: Cardiovascular;  Laterality: N/A;  ? TEE WITHOUT CARDIOVERSION N/A 03/12/2019  ? Procedure: TRANSESOPHAGEAL ECHOCARDIOGRAM (TEE);  Surgeon: BGaye Pollack MD;  [facility]  Service: Open Heart Surgery;  Laterality: N/A;  ? TUBAL LIGATION    ? ? ?Allergies ? ?No Known Allergies ? ?History of Present Illness  ?  ?Tammy BOERis a 76y.o. female who presents via audio/video conferencing for a telehealth visit today.  Pt was  last seen in cardiology clinic on 05/03/2021 by Dr. Bettina Gavia.  At that time Tammy Hunt was doing well.  Patient previously had cardiac catheterization prior to her bioprosthetic AVR surgery on 02/13/2019 which revealed 40% proximal to mid LAD lesion, 40% proximal RCA lesion, otherwise no significant disease.  No intervention was performed given the mild coronary artery disease.  Most recent echocardiogram performed on 05/04/2021 demonstrated EF 60 to 65%, grade 1 DD, mild LVH, no significant regional wall motion abnormality, 21 mm Edwards Inspriris  Resilia bioprosthetic valve present without significant stenosis or regurgitation.  The patient is now pending procedure as outlined above. Since her last visit, she has been doing well without exertional chest pain or significant worsening shortness of breath.  Functional ability is mainly limited by knee issue.  She does have some shortness of breath with more strenuous activity, however this is related to knee pain.  Last EKG obtained on 05/03/2021 was personally reviewed which showed normal sinus rhythm, poor R wave progression in the anterior leads, no significant ST-T wave changes. ? ? ?Home Medications  ?  ?Prior to Admission medications   ?Medication Sig Start Date End Date Taking? Authorizing Provider  ?aspirin EC 81 MG tablet Take 81 mg by mouth daily. Swallow whole.    [provider]  ?buPROPion (WELLBUTRIN XL) 300 MG 24 hr tablet Take 300 mg by mouth daily.  11/12/17   [provider]  ?Calcium Carbonate-Vitamin D (CALCIUM 600/VITAMIN D PO) Take 1 tablet by mouth daily.    [provider]  ?clonazePAM (KLONOPIN) 0.5 MG tablet Take 0.5 mg by mouth daily as needed for anxiety.     [provider]  ?ezetimibe (ZETIA) 10 MG tablet TAKE 1 TABLET BY MOUTH ONCE DAILY 08/01/21   Richardo Priest, MD  ?ibuprofen (ADVIL) 200 MG tablet Take 200 mg by mouth daily as needed (knee pain).    [provider]  ?levothyroxine (SYNTHROID, LEVOTHROID) 75 MCG tablet Take 1 tablet (75 mcg total) by mouth daily before breakfast. 06/29/17   Renato Shin, MD  ?Magnesium 500 MG TABS Take 500 mg by mouth daily.     [provider]  ?metoprolol tartrate (LOPRESSOR) 25 MG tablet TAKE 1/2 TABLET BY MOUTH TWICE DAILY. 05/24/21   Richardo Priest, MD  ?Omega-3 Fatty Acids (FISH OIL) 1000 MG CAPS Take 2,000 mg by mouth 2 (two) times daily.     [provider]  ?omeprazole (PRILOSEC) 20 MG capsule Take 20 mg by mouth daily.    [provider]  ?polyethylene glycol  (MIRALAX / GLYCOLAX) 17 g packet Take 17 g by mouth daily.    [provider]  ?rosuvastatin (CRESTOR) 10 MG tablet Take 1 tablet (10 mg total) by mouth daily. 11/02/21   Richardo Priest, MD  ?Vitamin D, Cholecalciferol, 50 MCG (2000 UT) CAPS Take 2,000 Units by mouth daily.     [provider]  ? ? ?Physical Exam  ?  ?Vital Signs:  ANNALYNNE IBANEZ does not have vital signs available for review today. ? ?Given telephonic nature of communication, physical exam is limited. ?AAOx3. NAD. Normal affect.  Speech and respirations are unlabored. ? ?Accessory Clinical Findings  ?  ?None ? ?Assessment & Plan  ?  ?1.  Preoperative Cardiovascular Risk Assessment: ? -Patient presented for virtual telephone visit prior to her total knee surgery in July.  Previous cardiac catheterization prior to her aortic valve replacement in 2020 demonstrated mild CAD.  Recent echocardiogram  in October 2022 showed normal functioning bioprosthetic AVR.  She denies any recent exertional chest pain.  She does have shortness of breath with more strenuous activity, this is related to knee issues.  Overall, patient is stable and may proceed with surgery without further work-up.  RCRI perioperative risk is class I, 0.4% risk of major cardiac event.  If needed, she may hold aspirin and fish oil for 7 days prior to the procedure and restart as soon as possible afterward at the surgeon's discretion. ? ?A copy of this note will be routed to requesting surgeon. ? ?Time:   ?Today, I have spent 5 minutes with the patient with telehealth technology discussing medical history, symptoms, and management plan.   ? ? ?Almyra Deforest, [place] ? ?11/22/2021, 1:01 PM ? ?

## 2021-11-29 ENCOUNTER — Encounter: Payer: Self-pay | Admitting: Cardiology

## 2021-12-07 DIAGNOSIS — M1990 Unspecified osteoarthritis, unspecified site: Secondary | ICD-10-CM | POA: Diagnosis not present

## 2021-12-07 DIAGNOSIS — Z9181 History of falling: Secondary | ICD-10-CM | POA: Diagnosis not present

## 2021-12-07 DIAGNOSIS — I339 Acute and subacute endocarditis, unspecified: Secondary | ICD-10-CM | POA: Diagnosis not present

## 2021-12-07 DIAGNOSIS — Z6841 Body Mass Index (BMI) 40.0 and over, adult: Secondary | ICD-10-CM | POA: Diagnosis not present

## 2021-12-07 DIAGNOSIS — E039 Hypothyroidism, unspecified: Secondary | ICD-10-CM | POA: Diagnosis not present

## 2022-01-13 HISTORY — PX: REPLACEMENT TOTAL KNEE: SUR1224

## 2022-01-17 DIAGNOSIS — M1712 Unilateral primary osteoarthritis, left knee: Secondary | ICD-10-CM | POA: Diagnosis not present

## 2022-01-17 DIAGNOSIS — M25569 Pain in unspecified knee: Secondary | ICD-10-CM | POA: Diagnosis not present

## 2022-01-17 DIAGNOSIS — Z01810 Encounter for preprocedural cardiovascular examination: Secondary | ICD-10-CM | POA: Diagnosis not present

## 2022-01-17 DIAGNOSIS — Z22322 Carrier or suspected carrier of Methicillin resistant Staphylococcus aureus: Secondary | ICD-10-CM | POA: Diagnosis not present

## 2022-01-17 DIAGNOSIS — Z01818 Encounter for other preprocedural examination: Secondary | ICD-10-CM | POA: Diagnosis not present

## 2022-01-17 DIAGNOSIS — Z0389 Encounter for observation for other suspected diseases and conditions ruled out: Secondary | ICD-10-CM | POA: Diagnosis not present

## 2022-01-17 DIAGNOSIS — R001 Bradycardia, unspecified: Secondary | ICD-10-CM | POA: Diagnosis not present

## 2022-02-12 DIAGNOSIS — G8929 Other chronic pain: Secondary | ICD-10-CM | POA: Diagnosis not present

## 2022-02-12 DIAGNOSIS — G8918 Other acute postprocedural pain: Secondary | ICD-10-CM | POA: Diagnosis not present

## 2022-02-12 DIAGNOSIS — E785 Hyperlipidemia, unspecified: Secondary | ICD-10-CM | POA: Diagnosis not present

## 2022-02-12 DIAGNOSIS — E039 Hypothyroidism, unspecified: Secondary | ICD-10-CM | POA: Diagnosis not present

## 2022-02-12 DIAGNOSIS — F419 Anxiety disorder, unspecified: Secondary | ICD-10-CM | POA: Diagnosis not present

## 2022-02-12 DIAGNOSIS — K219 Gastro-esophageal reflux disease without esophagitis: Secondary | ICD-10-CM | POA: Diagnosis not present

## 2022-02-12 DIAGNOSIS — M1712 Unilateral primary osteoarthritis, left knee: Secondary | ICD-10-CM | POA: Diagnosis not present

## 2022-02-12 DIAGNOSIS — I10 Essential (primary) hypertension: Secondary | ICD-10-CM | POA: Diagnosis not present

## 2022-02-12 DIAGNOSIS — M545 Low back pain, unspecified: Secondary | ICD-10-CM | POA: Diagnosis not present

## 2022-02-12 DIAGNOSIS — F32A Depression, unspecified: Secondary | ICD-10-CM | POA: Diagnosis not present

## 2022-02-13 DIAGNOSIS — F419 Anxiety disorder, unspecified: Secondary | ICD-10-CM | POA: Diagnosis not present

## 2022-02-13 DIAGNOSIS — E039 Hypothyroidism, unspecified: Secondary | ICD-10-CM | POA: Diagnosis not present

## 2022-02-13 DIAGNOSIS — E785 Hyperlipidemia, unspecified: Secondary | ICD-10-CM | POA: Diagnosis not present

## 2022-02-13 DIAGNOSIS — F32A Depression, unspecified: Secondary | ICD-10-CM | POA: Diagnosis not present

## 2022-02-13 DIAGNOSIS — I10 Essential (primary) hypertension: Secondary | ICD-10-CM | POA: Diagnosis not present

## 2022-02-13 DIAGNOSIS — G8929 Other chronic pain: Secondary | ICD-10-CM | POA: Diagnosis not present

## 2022-02-13 DIAGNOSIS — K219 Gastro-esophageal reflux disease without esophagitis: Secondary | ICD-10-CM | POA: Diagnosis not present

## 2022-02-13 DIAGNOSIS — M545 Low back pain, unspecified: Secondary | ICD-10-CM | POA: Diagnosis not present

## 2022-02-13 DIAGNOSIS — M1712 Unilateral primary osteoarthritis, left knee: Secondary | ICD-10-CM | POA: Diagnosis not present

## 2022-02-15 DIAGNOSIS — M62552 Muscle wasting and atrophy, not elsewhere classified, left thigh: Secondary | ICD-10-CM | POA: Diagnosis not present

## 2022-02-15 DIAGNOSIS — M25562 Pain in left knee: Secondary | ICD-10-CM | POA: Diagnosis not present

## 2022-02-15 DIAGNOSIS — Z96652 Presence of left artificial knee joint: Secondary | ICD-10-CM | POA: Diagnosis not present

## 2022-02-15 DIAGNOSIS — R2689 Other abnormalities of gait and mobility: Secondary | ICD-10-CM | POA: Diagnosis not present

## 2022-02-19 DIAGNOSIS — Z96652 Presence of left artificial knee joint: Secondary | ICD-10-CM | POA: Diagnosis not present

## 2022-02-19 DIAGNOSIS — M25562 Pain in left knee: Secondary | ICD-10-CM | POA: Diagnosis not present

## 2022-02-19 DIAGNOSIS — R2689 Other abnormalities of gait and mobility: Secondary | ICD-10-CM | POA: Diagnosis not present

## 2022-02-19 DIAGNOSIS — M25462 Effusion, left knee: Secondary | ICD-10-CM | POA: Diagnosis not present

## 2022-02-20 DIAGNOSIS — Z471 Aftercare following joint replacement surgery: Secondary | ICD-10-CM | POA: Diagnosis not present

## 2022-02-20 DIAGNOSIS — Z96652 Presence of left artificial knee joint: Secondary | ICD-10-CM | POA: Diagnosis not present

## 2022-02-26 DIAGNOSIS — M25462 Effusion, left knee: Secondary | ICD-10-CM | POA: Diagnosis not present

## 2022-02-26 DIAGNOSIS — Z96652 Presence of left artificial knee joint: Secondary | ICD-10-CM | POA: Diagnosis not present

## 2022-02-26 DIAGNOSIS — M25562 Pain in left knee: Secondary | ICD-10-CM | POA: Diagnosis not present

## 2022-02-26 DIAGNOSIS — R2689 Other abnormalities of gait and mobility: Secondary | ICD-10-CM | POA: Diagnosis not present

## 2022-02-28 DIAGNOSIS — Z96652 Presence of left artificial knee joint: Secondary | ICD-10-CM | POA: Diagnosis not present

## 2022-02-28 DIAGNOSIS — M25562 Pain in left knee: Secondary | ICD-10-CM | POA: Diagnosis not present

## 2022-02-28 DIAGNOSIS — M25462 Effusion, left knee: Secondary | ICD-10-CM | POA: Diagnosis not present

## 2022-02-28 DIAGNOSIS — R2689 Other abnormalities of gait and mobility: Secondary | ICD-10-CM | POA: Diagnosis not present

## 2022-03-02 DIAGNOSIS — R2689 Other abnormalities of gait and mobility: Secondary | ICD-10-CM | POA: Diagnosis not present

## 2022-03-02 DIAGNOSIS — M25462 Effusion, left knee: Secondary | ICD-10-CM | POA: Diagnosis not present

## 2022-03-02 DIAGNOSIS — M25562 Pain in left knee: Secondary | ICD-10-CM | POA: Diagnosis not present

## 2022-03-02 DIAGNOSIS — Z96652 Presence of left artificial knee joint: Secondary | ICD-10-CM | POA: Diagnosis not present

## 2022-03-05 DIAGNOSIS — M25562 Pain in left knee: Secondary | ICD-10-CM | POA: Diagnosis not present

## 2022-03-05 DIAGNOSIS — Z96652 Presence of left artificial knee joint: Secondary | ICD-10-CM | POA: Diagnosis not present

## 2022-03-05 DIAGNOSIS — M25462 Effusion, left knee: Secondary | ICD-10-CM | POA: Diagnosis not present

## 2022-03-05 DIAGNOSIS — R2689 Other abnormalities of gait and mobility: Secondary | ICD-10-CM | POA: Diagnosis not present

## 2022-03-07 DIAGNOSIS — M25562 Pain in left knee: Secondary | ICD-10-CM | POA: Diagnosis not present

## 2022-03-07 DIAGNOSIS — R2689 Other abnormalities of gait and mobility: Secondary | ICD-10-CM | POA: Diagnosis not present

## 2022-03-07 DIAGNOSIS — M25462 Effusion, left knee: Secondary | ICD-10-CM | POA: Diagnosis not present

## 2022-03-07 DIAGNOSIS — Z96652 Presence of left artificial knee joint: Secondary | ICD-10-CM | POA: Diagnosis not present

## 2022-03-12 DIAGNOSIS — M25562 Pain in left knee: Secondary | ICD-10-CM | POA: Diagnosis not present

## 2022-03-12 DIAGNOSIS — R2689 Other abnormalities of gait and mobility: Secondary | ICD-10-CM | POA: Diagnosis not present

## 2022-03-12 DIAGNOSIS — M25462 Effusion, left knee: Secondary | ICD-10-CM | POA: Diagnosis not present

## 2022-03-12 DIAGNOSIS — Z96652 Presence of left artificial knee joint: Secondary | ICD-10-CM | POA: Diagnosis not present

## 2022-03-13 DIAGNOSIS — Z6841 Body Mass Index (BMI) 40.0 and over, adult: Secondary | ICD-10-CM | POA: Diagnosis not present

## 2022-03-13 DIAGNOSIS — M25562 Pain in left knee: Secondary | ICD-10-CM | POA: Diagnosis not present

## 2022-03-13 DIAGNOSIS — F331 Major depressive disorder, recurrent, moderate: Secondary | ICD-10-CM | POA: Diagnosis not present

## 2022-03-15 DIAGNOSIS — M25562 Pain in left knee: Secondary | ICD-10-CM | POA: Diagnosis not present

## 2022-03-15 DIAGNOSIS — Z96652 Presence of left artificial knee joint: Secondary | ICD-10-CM | POA: Diagnosis not present

## 2022-03-15 DIAGNOSIS — M25462 Effusion, left knee: Secondary | ICD-10-CM | POA: Diagnosis not present

## 2022-03-15 DIAGNOSIS — R2689 Other abnormalities of gait and mobility: Secondary | ICD-10-CM | POA: Diagnosis not present

## 2022-03-20 DIAGNOSIS — M25462 Effusion, left knee: Secondary | ICD-10-CM | POA: Diagnosis not present

## 2022-03-20 DIAGNOSIS — Z96652 Presence of left artificial knee joint: Secondary | ICD-10-CM | POA: Diagnosis not present

## 2022-03-20 DIAGNOSIS — R2689 Other abnormalities of gait and mobility: Secondary | ICD-10-CM | POA: Diagnosis not present

## 2022-03-20 DIAGNOSIS — M25562 Pain in left knee: Secondary | ICD-10-CM | POA: Diagnosis not present

## 2022-03-22 ENCOUNTER — Other Ambulatory Visit: Payer: Self-pay | Admitting: Cardiology

## 2022-03-22 DIAGNOSIS — M25562 Pain in left knee: Secondary | ICD-10-CM | POA: Diagnosis not present

## 2022-03-22 DIAGNOSIS — Z96652 Presence of left artificial knee joint: Secondary | ICD-10-CM | POA: Diagnosis not present

## 2022-03-22 DIAGNOSIS — R2689 Other abnormalities of gait and mobility: Secondary | ICD-10-CM | POA: Diagnosis not present

## 2022-03-22 DIAGNOSIS — I2510 Atherosclerotic heart disease of native coronary artery without angina pectoris: Secondary | ICD-10-CM

## 2022-03-22 DIAGNOSIS — M25462 Effusion, left knee: Secondary | ICD-10-CM | POA: Diagnosis not present

## 2022-03-22 DIAGNOSIS — Z952 Presence of prosthetic heart valve: Secondary | ICD-10-CM

## 2022-03-22 MED ORDER — METOPROLOL TARTRATE 25 MG PO TABS: 12.5000 mg | ORAL_TABLET | Freq: Two times a day (BID) | ORAL | 0 refills | Status: DC

## 2022-03-22 NOTE — Telephone Encounter (Signed)
Rx refill sent to pharmacy. 

## 2022-03-26 DIAGNOSIS — M25562 Pain in left knee: Secondary | ICD-10-CM | POA: Diagnosis not present

## 2022-03-26 DIAGNOSIS — R2689 Other abnormalities of gait and mobility: Secondary | ICD-10-CM | POA: Diagnosis not present

## 2022-03-26 DIAGNOSIS — M25462 Effusion, left knee: Secondary | ICD-10-CM | POA: Diagnosis not present

## 2022-03-26 DIAGNOSIS — Z96652 Presence of left artificial knee joint: Secondary | ICD-10-CM | POA: Diagnosis not present

## 2022-03-29 DIAGNOSIS — R2689 Other abnormalities of gait and mobility: Secondary | ICD-10-CM | POA: Diagnosis not present

## 2022-03-29 DIAGNOSIS — M25562 Pain in left knee: Secondary | ICD-10-CM | POA: Diagnosis not present

## 2022-03-29 DIAGNOSIS — Z96652 Presence of left artificial knee joint: Secondary | ICD-10-CM | POA: Diagnosis not present

## 2022-03-29 DIAGNOSIS — M25462 Effusion, left knee: Secondary | ICD-10-CM | POA: Diagnosis not present

## 2022-04-10 DIAGNOSIS — Z96652 Presence of left artificial knee joint: Secondary | ICD-10-CM | POA: Diagnosis not present

## 2022-04-10 DIAGNOSIS — Z471 Aftercare following joint replacement surgery: Secondary | ICD-10-CM | POA: Diagnosis not present

## 2022-04-24 ENCOUNTER — Other Ambulatory Visit: Payer: Self-pay | Admitting: Cardiology

## 2022-05-03 DIAGNOSIS — Z23 Encounter for immunization: Secondary | ICD-10-CM | POA: Diagnosis not present

## 2022-05-07 ENCOUNTER — Other Ambulatory Visit: Payer: Self-pay | Admitting: Cardiology

## 2022-05-07 DIAGNOSIS — I2510 Atherosclerotic heart disease of native coronary artery without angina pectoris: Secondary | ICD-10-CM

## 2022-05-07 DIAGNOSIS — Z952 Presence of prosthetic heart valve: Secondary | ICD-10-CM

## 2022-06-13 ENCOUNTER — Other Ambulatory Visit: Payer: Self-pay | Admitting: Cardiology

## 2022-06-13 DIAGNOSIS — I2510 Atherosclerotic heart disease of native coronary artery without angina pectoris: Secondary | ICD-10-CM

## 2022-06-13 DIAGNOSIS — Z952 Presence of prosthetic heart valve: Secondary | ICD-10-CM

## 2022-06-25 ENCOUNTER — Other Ambulatory Visit: Payer: Self-pay | Admitting: Cardiology

## 2022-06-25 NOTE — Telephone Encounter (Signed)
Rx refill sent to pharmacy. 

## 2022-07-12 ENCOUNTER — Encounter: Payer: Self-pay | Admitting: Nurse Practitioner

## 2022-07-19 DIAGNOSIS — Z780 Asymptomatic menopausal state: Secondary | ICD-10-CM | POA: Diagnosis not present

## 2022-07-19 DIAGNOSIS — M810 Age-related osteoporosis without current pathological fracture: Secondary | ICD-10-CM | POA: Diagnosis not present

## 2022-07-19 DIAGNOSIS — M85852 Other specified disorders of bone density and structure, left thigh: Secondary | ICD-10-CM | POA: Diagnosis not present

## 2022-07-19 DIAGNOSIS — Z1231 Encounter for screening mammogram for malignant neoplasm of breast: Secondary | ICD-10-CM | POA: Diagnosis not present

## 2022-07-19 NOTE — Progress Notes (Signed)
Cardiology Office Note:    Date:  07/20/2022   ID:  Tammy Hunt, DOB 1946/06/14, MRN [id number]  PCP:  Tammy Sheriff, MD  Cardiologist:  Tammy More, MD    Referring MD: Tammy Sheriff, MD    ASSESSMENT:    1. S/P AVR (aortic valve replacement)   2. Mixed hyperlipidemia   3. Mild coronary artery disease   4. Mild nonobstructive coronary artery disease    PLAN:    In order of problems listed above:  Tammy Hunt continues to do well following surgical AVR with no evidence of valve dysfunction, she will continue aspirin as antiplatelet therapy Continue her current lipid-lowering high intensity statin and Zetia Stable having no angina continue medical therapy   Next appointment: 1 year   Medication Adjustments/Labs and Tests Ordered: Current medicines are reviewed at length with the patient today.  Concerns regarding medicines are outlined above.  Orders Placed This Encounter  Procedures   Comprehensive metabolic panel   Lipid panel   EKG 12-Lead   Meds ordered this encounter  Medications   rosuvastatin (CRESTOR) 10 MG tablet    Sig: Take 1 tablet (10 mg total) by mouth daily.    Dispense:  90 tablet    Refill:  3   metoprolol tartrate (LOPRESSOR) 25 MG tablet    Sig: Take 0.5 tablets (12.5 mg total) by mouth 2 (two) times daily.    Dispense:  90 tablet    Refill:  3   ezetimibe (ZETIA) 10 MG tablet    Sig: Take 1 tablet (10 mg total) by mouth daily.    Dispense:  90 tablet    Refill:  3    Chief Complaint  Patient presents with   Follow-up    AVR     History of Present Illness:    Tammy Hunt is a 77 y.o. female with a hx of symptomatic and severe aortic stenosis with surgical aortic valve replacement #21 InspirEase pericardial valve 03/11/2021, mild CAD and mixed hyperlipidemia last seen 08/03/2020.  Compliance with diet, lifestyle and medications: Yes  Tammy Hunt struggled with recovery from total knee arthroplasty She is not having edema  shortness of breath chest pain palpitation or syncope   Echocardiogram 05/04/2021 showed normal left ventricular size mild LVH normal systolic function EF 60 to 65%.  Right ventricle normal size function and pulmonary artery pressure.  Surgical AVR present normal appearance and function.  1. Left ventricular ejection fraction, by estimation, is 60 to 65%. The  left ventricle has normal function. The left ventricle has no regional  wall motion abnormalities. There is mild concentric left ventricular  hypertrophy. Left ventricular diastolic  parameters are consistent with Grade I diastolic dysfunction (impaired  relaxation).   2. Right ventricular systolic function is mildly reduced. The right  ventricular size is normal. There is normal pulmonary artery systolic  pressure.   3. The mitral valve is normal in structure. No evidence of mitral valve  regurgitation. No evidence of mitral stenosis.   4. Aortic valve regurgitation is not visualized. No aortic stenosis is  present. There is a a 41m Edwards INSPIRIS RESILIA valve present in the  aortic position. Procedure Date: 03/12/2019. Echo findings are consistent  with normal structure and function  of the aortic valve prosthesis. Past Medical History:  Diagnosis Date   Anxiety    Arthritis    COVID-19 virus infection 02/2020   had vaccine in March 2021 and breakthough in 02/2020 and the antibodiy  infusion    Depression    Diverticulitis    Heart murmur    Hyperlipidemia    Mild nonobstructive coronary artery disease    (a) 02/13/19 cath with 40% stenosis to LAD and RCA - mild nonobstructive disease rec for medical mgmt   Mitral valve regurgitation    Osteopenia    Palpitations    Papilloma of breast    S/P AVR (aortic valve replacement) 03/12/2019   (a) SAVR with 55m [place]valve   Severe aortic stenosis    s/p SAVR 03/12/19   Thyroid disease     Past Surgical History:  Procedure Laterality Date    AORTIC VALVE REPLACEMENT N/A 03/12/2019   Procedure: AORTIC VALVE REPLACEMENT (AVR);  Surgeon: BGaye Pollack MD;  [facility]  Service: Open Heart Surgery;  Laterality: N/A;   BIOPSY THYROID  JAN 2015   BOWEL RESECTION     BREAST SURGERY     Left breast   CESAREAN SECTION     CHOLECYSTECTOMY     COLONOSCOPY  07/26/2014   Colonic polyps statuas post polypectomy. Mild neo diverticulosis. External hemorrhoids.    COLOSTOMY TAKEDOWN     CORONARY ANGIOPLASTY     FINGER SURGERY     LEFT HEART CATH AND CORONARY ANGIOGRAPHY N/A 02/13/2019   Procedure: LEFT HEART CATH AND CORONARY ANGIOGRAPHY;  Surgeon: CSherren Mocha MD;  [facility];  Service: Cardiovascular;  Laterality: N/A;   TEE WITHOUT CARDIOVERSION N/A 03/12/2019   Procedure: TRANSESOPHAGEAL ECHOCARDIOGRAM (TEE);  Surgeon: BGaye Pollack MD;  [facility]  Service: Open Heart Surgery;  Laterality: N/A;   TUBAL LIGATION      Current Medications: Current Meds  Medication Sig   aspirin EC 81 MG tablet Take 81 mg by mouth daily. Swallow whole.   buPROPion (WELLBUTRIN XL) 300 MG 24 hr tablet Take 300 mg by mouth daily.    Calcium Carbonate (CALCIUM 500 PO) Take 1 tablet by mouth daily.   Calcium Carbonate-Vitamin D (CALCIUM 600/VITAMIN D PO) Take 1 tablet by mouth daily.   clonazePAM (KLONOPIN) 0.5 MG tablet Take 0.5 mg by mouth daily as needed for anxiety.    ibuprofen (ADVIL) 200 MG tablet Take 200 mg by mouth daily as needed (knee pain).   levothyroxine (SYNTHROID, LEVOTHROID) 75 MCG tablet Take 1 tablet (75 mcg total) by mouth daily before breakfast.   Magnesium 500 MG TABS Take 500 mg by mouth daily.    Magnesium Oxide -Mg Supplement 250 MG TABS Take by mouth.   Omega-3 Fatty Acids (FISH OIL) 1000 MG CAPS Take 2,000 mg by mouth 2 (two) times daily.    omeprazole (PRILOSEC) 20 MG capsule Take 20 mg by mouth daily.   polyethylene glycol (MIRALAX / GLYCOLAX) 17 g packet Take 17 g by mouth daily.    [DISCONTINUED] ezetimibe (ZETIA) 10 MG tablet TAKE 1 TABLET BY MOUTH ONCE DAILY   [DISCONTINUED] metoprolol tartrate (LOPRESSOR) 25 MG tablet Take 0.5 tablets (12.5 mg total) by mouth 2 (two) times daily. Patient must keep appointment for 07-20-22 further refills. 3 rd/final attempt   [DISCONTINUED] rosuvastatin (CRESTOR) 10 MG tablet Take 1 tablet (10 mg total) by mouth daily.     Allergies:   Patient has no known allergies.   Social History   Socioeconomic History   Marital status: Married    Spouse name: Not on file   Number of children: Not on file   Years of education: Not on file  Highest education level: Not on file  Occupational History   Not on file  Tobacco Use   Smoking status: Former    Types: Cigarettes    Quit date: 07/19/2005    Years since quitting: 17.0   Smokeless tobacco: Never  Vaping Use   Vaping Use: Never used  Substance and Sexual Activity   Alcohol use: No   Drug use: No   Sexual activity: Not Currently    Birth control/protection: Surgical, Post-menopausal    Comment: PM/BTL, First IC  Other Topics Concern   Not on file  Social History Narrative   Not on file   Social Determinants of Health   Financial Resource Strain: Not on file  Food Insecurity: Not on file  Transportation Needs: Not on file  Physical Activity: Not on file  Stress: Not on file  Social Connections: Not on file     Family History: The patient's family history includes Breast cancer in her maternal aunt; Cancer in her maternal aunt, maternal grandmother, maternal uncle, and mother; Heart disease in her father; Lung cancer in her mother. There is no history of Colon cancer. ROS:   Please see the history of present illness.    All other systems reviewed and are negative.  EKGs/Labs/Other Studies Reviewed:    The following studies were reviewed today:   Recent Labs:  Recent Lipid Panel    Component Value Date/Time   CHOL 193 05/03/2021 1003   TRIG 107 05/03/2021 1003    HDL 67 05/03/2021 1003   CHOLHDL 2.9 05/03/2021 1003   LDLCALC 107 (H) 05/03/2021 1003    Physical Exam:    VS:  BP 136/74 (BP Location: Right Arm, Patient Position: Sitting)   Pulse 60   Ht '4\' 10"'$  (1.473 m)   Wt 230 lb (104.3 kg)   SpO2 99%   BMI 48.07 kg/m     Wt Readings from Last 3 Encounters:  07/20/22 230 lb (104.3 kg)  05/03/21 217 lb (98.4 kg)  07/19/20 227 lb (103 kg)     GEN:  Well nourished, well developed in no acute distress HEENT: Normal NECK: No JVD; No carotid bruits LYMPHATICS: No lymphadenopathy CARDIAC: 1 out of 6 ejection murmur localized aortic area S2 normal no aortic regurgitation RRR, no rubs, gallops RESPIRATORY:  Clear to auscultation without rales, wheezing or rhonchi  ABDOMEN: Soft, non-tender, non-distended MUSCULOSKELETAL:  No edema; No deformity  SKIN: Warm and dry NEUROLOGIC:  Alert and oriented x 3 PSYCHIATRIC:  Normal affect    Signed, Tammy More, MD  07/20/2022 5:11 PM    [facility]

## 2022-07-20 ENCOUNTER — Ambulatory Visit: Payer: Medicare PPO | Attending: Cardiology | Admitting: Cardiology

## 2022-07-20 ENCOUNTER — Encounter: Payer: Self-pay | Admitting: Cardiology

## 2022-07-20 VITALS — BP 136/74 | HR 60 | Ht <= 58 in | Wt 230.0 lb

## 2022-07-20 DIAGNOSIS — E782 Mixed hyperlipidemia: Secondary | ICD-10-CM | POA: Diagnosis not present

## 2022-07-20 DIAGNOSIS — I2510 Atherosclerotic heart disease of native coronary artery without angina pectoris: Secondary | ICD-10-CM | POA: Diagnosis not present

## 2022-07-20 DIAGNOSIS — Z952 Presence of prosthetic heart valve: Secondary | ICD-10-CM | POA: Diagnosis not present

## 2022-07-20 MED ORDER — METOPROLOL TARTRATE 25 MG PO TABS: 12.5000 mg | ORAL_TABLET | Freq: Two times a day (BID) | ORAL | 3 refills | Status: DC

## 2022-07-20 MED ORDER — ROSUVASTATIN CALCIUM 10 MG PO TABS: 10.0000 mg | ORAL_TABLET | Freq: Every day | ORAL | 3 refills | Status: DC

## 2022-07-20 MED ORDER — EZETIMIBE 10 MG PO TABS: 10.0000 mg | ORAL_TABLET | Freq: Every day | ORAL | 3 refills | Status: DC

## 2022-07-20 NOTE — Patient Instructions (Addendum)
medication Instructions:  Your physician recommends that you continue on your current medications as directed. Please refer to the Current Medication list given to you today.  *If you need a refill on your cardiac medications before your next appointment, please call your pharmacy*   Lab Work: Your physician recommends that you return for lab work in: Today for [facility] and Lipid Panel  If you have labs (blood work) drawn today and your tests are completely normal, you will receive your results only by: MyChart Message (if you have MyChart) OR A paper copy in the mail If you have any lab test that is abnormal or we need to change your treatment, we will call you to review the results.   Testing/Procedures: NONE   Follow-Up: At [facility], you and your health needs are our priority.  As part of our continuing mission to provide you with exceptional heart care, we have created designated Provider Care Teams.  These Care Teams include your primary Cardiologist (physician) and Advanced Practice Providers (APPs -  Physician Assistants and Nurse Practitioners) who all work together to provide you with the care you need, when you need it.  We recommend signing up for the patient portal called "MyChart".  Sign up information is provided on this After Visit Summary.  MyChart is used to connect with patients for Virtual Visits (Telemedicine).  Patients are able to view lab/test results, encounter notes, upcoming appointments, etc.  Non-urgent messages can be sent to your provider as well.   To learn more about what you can do with MyChart, go to [email or web address].    Your next appointment:   1 year(s)  The format for your next appointment:   In Person  Provider:   Shirlee More, MD    Other Instructions   Important Information About Sugar

## 2022-07-21 LAB — LIPID PANEL
Chol/HDL Ratio: 3.3 ratio (ref 0.0–4.4)
Cholesterol, Total: 196 mg/dL (ref 100–199)
HDL: 60 mg/dL (ref >39)
LDL Chol Calc (NIH): 108 mg/dL — ABNORMAL HIGH (ref 0–99)
Triglycerides: 163 mg/dL — ABNORMAL HIGH (ref 0–149)
VLDL Cholesterol Cal: 28 mg/dL (ref 5–40)

## 2022-07-21 LAB — COMPREHENSIVE METABOLIC PANEL
ALT: 18 IU/L (ref 0–32)
AST: 17 IU/L (ref 0–40)
Albumin/Globulin Ratio: 2 (ref 1.2–2.2)
Albumin: 4.5 g/dL (ref 3.8–4.8)
Alkaline Phosphatase: 81 IU/L (ref 44–121)
BUN/Creatinine Ratio: 14 (ref 12–28)
BUN: 16 mg/dL (ref 8–27)
Bilirubin Total: 0.3 mg/dL (ref 0.0–1.2)
CO2: 23 mmol/L (ref 20–29)
Calcium: 9.7 mg/dL (ref 8.7–10.3)
Chloride: 102 mmol/L (ref 96–106)
Creatinine, Ser: 1.11 mg/dL — ABNORMAL HIGH (ref 0.57–1.00)
Globulin, Total: 2.2 g/dL (ref 1.5–4.5)
Glucose: 113 mg/dL — ABNORMAL HIGH (ref 70–99)
Potassium: 5.2 mmol/L (ref 3.5–5.2)
Sodium: 140 mmol/L (ref 134–144)
Total Protein: 6.7 g/dL (ref 6.0–8.5)
eGFR: 52 mL/min/{1.73_m2} — ABNORMAL LOW (ref >59)

## 2022-07-23 ENCOUNTER — Encounter: Payer: Self-pay | Admitting: Nurse Practitioner

## 2022-07-23 ENCOUNTER — Other Ambulatory Visit: Payer: Self-pay

## 2022-07-23 ENCOUNTER — Ambulatory Visit (INDEPENDENT_AMBULATORY_CARE_PROVIDER_SITE_OTHER): Payer: Medicare PPO | Admitting: Nurse Practitioner

## 2022-07-23 VITALS — BP 142/94 | HR 86 | Ht <= 58 in | Wt 229.0 lb

## 2022-07-23 DIAGNOSIS — Z01419 Encounter for gynecological examination (general) (routine) without abnormal findings: Secondary | ICD-10-CM

## 2022-07-23 DIAGNOSIS — M810 Age-related osteoporosis without current pathological fracture: Secondary | ICD-10-CM

## 2022-07-23 DIAGNOSIS — Z780 Asymptomatic menopausal state: Secondary | ICD-10-CM

## 2022-07-23 MED ORDER — IBANDRONATE SODIUM 150 MG PO TABS: 150.0000 mg | ORAL_TABLET | ORAL | 3 refills | Status: DC

## 2022-07-23 MED ORDER — ROSUVASTATIN CALCIUM 20 MG PO TABS: 20.0000 mg | ORAL_TABLET | Freq: Every day | ORAL | 3 refills | Status: DC

## 2022-07-23 NOTE — Progress Notes (Signed)
Tammy Hunt [birth year] [id number]   History:  77 y.o. G2P1001 presents for breast and pelvic exam without GYN complaints. Postmenopausal - no HRT, no bleeding. History of endometrial polyp, fibroids and osteopenia. Most recent DXA 07/19/2021 showed osteoporosis with T-score -2.7. PCP manages hypothyroidism, HLD, vitamin D deficiency. Aortic valve replacement 03/12/2019, followed by cardiology. Colectomy with ostomy in the past for hernia and adhesions, reversed, followed by GI. Left knee replacement 01/2022.   Gynecologic History No LMP recorded. Patient is postmenopausal.   Contraception: post menopausal status Sexually active: No  Health maintenance Last Pap: 12/21/2014. Results were: Normal Last mammogram: 07/19/2022. Results were: Normal Last colonoscopy: 07/19/2020. Results were: Tubular adenoma, no longer screening Last Dexa: 07/19/2021. Results were: T-score -2.7  Past medical history, past surgical history, family history and social history were all reviewed and documented in the EPIC chart. Married. Retired from state. Has 1 son, married with 3 and 57 yo sons.   ROS:  A ROS was performed and pertinent positives and negatives are included.  Exam:  Vitals:   07/23/22 1430  BP: (!) 142/94  Pulse: 86  SpO2: 96%  Weight: 229 lb (103.9 kg)  Height: 4' 9.5" (1.461 m)    Body mass index is 48.7 kg/m.  General appearance:  Normal Thyroid:  Symmetrical, normal in size, without palpable masses or nodularity. Respiratory  Auscultation:  Clear without wheezing or rhonchi Cardiovascular  Auscultation:  Regular rate, without rubs, murmurs or gallops  Edema/varicosities:  Not grossly evident Abdominal  Soft,nontender, without masses, guarding or rebound.  Liver/spleen:  No organomegaly noted  Hernia:  None appreciated  Skin  Inspection:  Grossly normal Breasts: Examined lying and sitting.   Right: Without masses, retractions, nipple discharge or axillary  adenopathy.   Left: Without masses, retractions, nipple discharge or axillary adenopathy. Genitourinary   Inguinal/mons:  Normal without inguinal adenopathy  External genitalia:  Normal appearing vulva with no masses, tenderness, or lesions  BUS/Urethra/Skene's glands:  Normal  Vagina:  Normal appearing with normal color and discharge, no lesions  Cervix:  Normal appearing without discharge or lesions  Uterus:  Difficult to palpate due to body habitus but no gross masses or tenderness  Adnexa/parametria:     Rt: Normal in size, without masses or tenderness.   Lt: Normal in size, without masses or tenderness.  Anus and perineum: Normal  Digital rectal exam: Deferred  Patient informed chaperone available to be present for breast and pelvic exam. Patient has requested no chaperone to be present. Patient has been advised what will be completed during breast and pelvic exam.   Assessment/Plan:  77 y.o. G2 P1 for breast and pelvic exam.  Encounter for breast and pelvic examination - Education provided on SBEs, importance of preventative screenings, current guidelines, high calcium diet, daily vitamin D supplement, regular exercise, and multivitamin daily.  Labs with PCP.   Postmenopausal - no HRT, no bleeding.   Age-related osteoporosis without current pathological fracture - Plan: ibandronate (BONIVA) 150 MG tablet every 30 days. Educated on proper use and possible side effects. Reviewed most recent DXA 07/19/22 with option to increase exercise and optimize Vitamin D + Calcium or treat with start medication management with bisphosphonates. She would like to start treatment. Sister also has osteoporosis. Fall prevention and home safety reviewed. Recommend getting back into exercise, continue Vitamin D + Calcium. Will repeat DXA in 2 years.   Screening for cervical cancer - Normal Pap history.  No longer screening per guidelines.   Screening  for breast cancer - Normal mammogram history.  Continue  annual screenings.  Normal breast exam today.  Screening for colon cancer - 07/2020 colonoscopy. No longer screening per GI recommendation.   Follow-up in 1 year for medication management and 2 years for breast and pelvic exam.        Tammy Hunt [facility], 3:04 PM 07/23/2022

## 2022-08-23 DIAGNOSIS — Z471 Aftercare following joint replacement surgery: Secondary | ICD-10-CM | POA: Diagnosis not present

## 2022-08-23 DIAGNOSIS — Z96652 Presence of left artificial knee joint: Secondary | ICD-10-CM | POA: Diagnosis not present

## 2022-08-24 DIAGNOSIS — E039 Hypothyroidism, unspecified: Secondary | ICD-10-CM | POA: Diagnosis not present

## 2022-08-24 DIAGNOSIS — F331 Major depressive disorder, recurrent, moderate: Secondary | ICD-10-CM | POA: Diagnosis not present

## 2022-08-24 DIAGNOSIS — R5383 Other fatigue: Secondary | ICD-10-CM | POA: Diagnosis not present

## 2022-08-24 DIAGNOSIS — Z0000 Encounter for general adult medical examination without abnormal findings: Secondary | ICD-10-CM | POA: Diagnosis not present

## 2022-08-24 DIAGNOSIS — Z79899 Other long term (current) drug therapy: Secondary | ICD-10-CM | POA: Diagnosis not present

## 2022-08-24 DIAGNOSIS — Z6841 Body Mass Index (BMI) 40.0 and over, adult: Secondary | ICD-10-CM | POA: Diagnosis not present

## 2022-10-25 ENCOUNTER — Telehealth: Payer: Self-pay | Admitting: Gastroenterology

## 2022-10-25 ENCOUNTER — Other Ambulatory Visit: Payer: Self-pay

## 2022-10-25 MED ORDER — ONDANSETRON 4 MG PO TBDP: 4.0000 mg | ORAL_TABLET | Freq: Three times a day (TID) | ORAL | 2 refills | Status: DC | PRN

## 2022-10-25 MED ORDER — PANTOPRAZOLE SODIUM 40 MG PO TBEC: 40.0000 mg | DELAYED_RELEASE_TABLET | Freq: Every day | ORAL | 0 refills | Status: DC

## 2022-10-25 NOTE — Telephone Encounter (Signed)
Change omeprazole to Protonix 40 mg p.o. daily #90 Zofran 4 mg ODT Q8hrs prn #20, 2RF RG

## 2022-10-25 NOTE — Telephone Encounter (Signed)
Spoke with pt and she is aware of Dr. Urban Gibson recommendations. Scripts sent to pts pharmacy and she is aware.

## 2022-10-25 NOTE — Telephone Encounter (Signed)
Pt called states she has not had anything to eat or drink since last night. She started vomiting at 6am this morning, she has been eating ice chips. Pt has appt coming up but is calling requesting something be called in for nausea. Please advise.

## 2022-10-25 NOTE — Telephone Encounter (Signed)
Inbound call from patient wanting to see if there any way possible to get some medicine for nauseas ,she got up today really sick ,patient stated.Please advise

## 2022-11-02 ENCOUNTER — Encounter: Payer: Self-pay | Admitting: Gastroenterology

## 2022-11-02 ENCOUNTER — Ambulatory Visit: Payer: Medicare PPO | Admitting: Gastroenterology

## 2022-11-02 ENCOUNTER — Telehealth: Payer: Self-pay

## 2022-11-02 VITALS — BP 110/72 | HR 60 | Ht <= 58 in | Wt 227.0 lb

## 2022-11-02 DIAGNOSIS — R112 Nausea with vomiting, unspecified: Secondary | ICD-10-CM

## 2022-11-02 DIAGNOSIS — Z8719 Personal history of other diseases of the digestive system: Secondary | ICD-10-CM

## 2022-11-02 DIAGNOSIS — Z86010 Personal history of colonic polyps: Secondary | ICD-10-CM

## 2022-11-02 MED ORDER — ONDANSETRON 4 MG PO TBDP: 4.0000 mg | ORAL_TABLET | Freq: Three times a day (TID) | ORAL | 2 refills | Status: AC | PRN

## 2022-11-02 NOTE — Patient Instructions (Addendum)
_______________________________________________________  If your blood pressure at your visit was 140/90 or greater, please contact your primary care physician to follow up on this.  _______________________________________________________  If you are age 76 or older, your body mass index should be between 23-30. Your Body mass index is 47.44 kg/m. If this is out of the aforementioned range listed, please consider follow up with your Primary Care Provider.  If you are age 46 or younger, your body mass index should be between 19-25. Your Body mass index is 47.44 kg/m. If this is out of the aformentioned range listed, please consider follow up with your Primary Care Provider.   ________________________________________________________  The [facility] providers would like to encourage you to use [facility] to communicate with providers for non-urgent requests or questions.  Due to long hold times on the telephone, sending your provider a message by [facility] may be a faster and more efficient way to get a response.  Please allow 48 business hours for a response.  Please remember that this is for non-urgent requests.  _______________________________________________________  Kinnie Scales  We have sent the following medications to your pharmacy for you to pick up at your convenience: Zofran   You have been scheduled for an endoscopy. Please follow written instructions given to you at your visit today. If you use inhalers (even only as needed), please bring them with you on the day of your procedure.  You will be contacted by our office prior to your procedure regarding cardiac clearance.  If you do not hear from our office 2 week prior to your scheduled procedure, please call [phone] to discuss.   Thank you,  Dr. Lynann Bologna

## 2022-11-02 NOTE — Telephone Encounter (Signed)
 [facility] Pre-operative Risk Assessment     Request for surgical clearance:     Endoscopy Procedure  What type of surgery is being performed?     EGD  When is this surgery scheduled?     12-27-2022  What type of clearance is required ?  Cardiac Clearance  Are there any medications that need to be held prior to surgery and how long? No medications. Needs to know if patient is able to have an EGD or not  Practice name and name of physician performing surgery?      [facility]  What is your office phone and fax number?      Phone- [phone]  Fax- [phone]  Anesthesia type (None, local, MAC, general) ?       MAC

## 2022-11-02 NOTE — Progress Notes (Unsigned)
Chief Complaint: Discuss colonoscopy  Referring Provider:  Noni Saupe, MD      ASSESSMENT AND PLAN;   #1. N/V- better  #2. H/O colonic polyps - next colon 07/2019***  #3.  H/O diverticulitis s/p Hartman's procedure 2008 followed by takedown of ostomy with LOA (Dr Landis Martins)  #3. 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 CTA chest/Abdo/pelvis- large infraumbilical ventral hernia containing multiple loops of small bowel.  No bowel incarceration or obstruction.  Plan: -Continue protonix 40mg  po QD -Zofran 4 mg ODT Q8hrs prn #20, 2RF  -EGD after cardio clearance (from Dr. Dulce Sellar) at [facility].   Risks and benefits were discussed.  She does satisfy [facility] criteria.  HPI:    Tammy Hunt is a 77 y.o. female   Multiple medical problems as below including S/P TAVR for severe aortic stenosis 03/12/2019 (bioprosthetic valve, no AC) Doing much better from cardiac standpoint.  Seen recently by Dr. Dulce Sellar.  Echo showed normal EF 06/2021  Had 3 discrete episodes of N/V  Jan 26, March 6, April11  After breakfast first 2 times and then April 11 episode-6 AM which woke her up.  She had vomiting until late afternoon.  It spontaneously got better.  She could not identify any definite triggering foods.  No abdominal pain.  Denies having any significant heartburn.  No fever chills or night sweats.  No recent travel.  No exposure to anyone with gastroenteritis.  She called.  Omeprazole was changed to Protonix.  She was also given Zofran.  She did not have any further attacks since.  However, quite concerned about any problems especially any cancers of the stomach.  No weight loss.  She has been moving her bowels okay.   Past GI workup  Colonoscopy 07/19/2020 ***   CTA 02/2019 1. Vascular findings and measurements pertinent to potential T AVR procedure, as detailed above. 2. Severe thickening calcification of the aortic  valve, compatible with the reported clinical history of severe aortic stenosis. 3. Aortic atherosclerosis, in addition to left main and 2 vessel coronary artery disease. 4. Colonic diverticulosis without evidence of acute diverticulitis at this time. 5. Large infraumbilical ventral hernia containing multiple loops of small bowel. No associated bowel incarceration or obstruction at this time. In addition, there is a small inferior left Spigelian hernia containing only fat. 6. Additional incidental findings, as above. Past Medical History:  Diagnosis Date   Anxiety    Arthritis    COVID-19 virus infection 02/2020   had vaccine in March 2021 and breakthough in 02/2020 and the antibodiy infusion    Depression    Diverticulitis    Heart murmur    Hyperlipidemia    Mild nonobstructive coronary artery disease    (a) 02/13/19 cath with 40% stenosis to LAD and RCA - mild nonobstructive disease rec for medical mgmt   Mitral valve regurgitation    Osteopenia    Palpitations    Papilloma of breast    S/P AVR (aortic valve replacement) 03/12/2019   (a) SAVR with 21mm Edwards INSPIRIS RESILIA percardial valve   Severe aortic stenosis    s/p SAVR 03/12/19   Thyroid disease     Past Surgical History:  Procedure Laterality Date   AORTIC VALVE REPLACEMENT N/A 03/12/2019   Procedure: AORTIC VALVE REPLACEMENT (AVR);  Surgeon: Alleen Borne, MD;  Location[facility];  Service: Open Heart Surgery;  Laterality: N/A;   BIOPSY THYROID  07/2013  BOWEL RESECTION     BREAST SURGERY     Left breast   CESAREAN SECTION     CHOLECYSTECTOMY     COLONOSCOPY  07/26/2014   Colonic polyps statuas post polypectomy. Mild neo diverticulosis. External hemorrhoids.    COLOSTOMY TAKEDOWN     CORONARY ANGIOPLASTY     FINGER SURGERY     LEFT HEART CATH AND CORONARY ANGIOGRAPHY N/A 02/13/2019   Procedure: LEFT HEART CATH AND CORONARY ANGIOGRAPHY;  Surgeon: Tonny Bollman, MD;  Location: [facility] INVASIVE CV LAB;   Service: Cardiovascular;  Laterality: N/A;   REPLACEMENT TOTAL KNEE Left 01/2022   TEE WITHOUT CARDIOVERSION N/A 03/12/2019   Procedure: TRANSESOPHAGEAL ECHOCARDIOGRAM (TEE);  Surgeon: Alleen Borne, MD;  Location: [facility] OR;  Service: Open Heart Surgery;  Laterality: N/A;   TUBAL LIGATION      Family History  Problem Relation Age of Onset   Lung cancer Mother    Cancer Mother        lung-smoker   Heart disease Father    Cancer Maternal Grandmother        liver   Breast cancer Maternal Aunt        age 76   Cancer Maternal Aunt        lymphoma   Cancer Maternal Uncle        lung   Colon cancer Neg Hx     Social History   Tobacco Use   Smoking status: Former    Types: Cigarettes    Quit date: 07/19/2005    Years since quitting: 17.3   Smokeless tobacco: Never  Vaping Use   Vaping Use: Never used  Substance Use Topics   Alcohol use: No   Drug use: No    Current Outpatient Medications  Medication Sig Dispense Refill   aspirin EC 81 MG tablet Take 81 mg by mouth daily. Swallow whole.     buPROPion (WELLBUTRIN XL) 300 MG 24 hr tablet Take 300 mg by mouth daily.   0   Calcium Carbonate (CALCIUM 500 PO) Take 1 tablet by mouth daily.     Calcium Carbonate-Vitamin D (CALCIUM 600/VITAMIN D PO) Take 1 tablet by mouth daily.     clonazePAM (KLONOPIN) 0.5 MG tablet Take 0.5 mg by mouth daily as needed for anxiety.      ezetimibe (ZETIA) 10 MG tablet Take 1 tablet (10 mg total) by mouth daily. 90 tablet 3   ibandronate (BONIVA) 150 MG tablet Take 1 tablet (150 mg total) by mouth every 30 (thirty) days. Take in the morning with a full glass of water, on an empty stomach, and do not take anything else by mouth or lie down for the next 30 min. 3 tablet 3   ibuprofen (ADVIL) 200 MG tablet Take 200 mg by mouth daily as needed (knee pain).     levothyroxine (SYNTHROID, LEVOTHROID) 75 MCG tablet Take 1 tablet (75 mcg total) by mouth daily before breakfast. 90 tablet 3   Magnesium 500 MG TABS  Take 500 mg by mouth daily.      Magnesium Oxide -Mg Supplement 250 MG TABS Take by mouth.     metoprolol tartrate (LOPRESSOR) 25 MG tablet Take 0.5 tablets (12.5 mg total) by mouth 2 (two) times daily. 90 tablet 3   Omega-3 Fatty Acids (FISH OIL) 1000 MG CAPS Take 2,000 mg by mouth 2 (two) times daily.      ondansetron (ZOFRAN-ODT) 4 MG disintegrating tablet Take 1 tablet (4 mg total) by mouth  every 8 (eight) hours as needed for nausea or vomiting. 20 tablet 2   pantoprazole (PROTONIX) 40 MG tablet Take 1 tablet (40 mg total) by mouth daily. 90 tablet 0   polyethylene glycol (MIRALAX / GLYCOLAX) 17 g packet Take 17 g by mouth daily.     rosuvastatin (CRESTOR) 20 MG tablet Take 1 tablet (20 mg total) by mouth daily. 90 tablet 3   No current facility-administered medications for this visit.    No Known Allergies  Review of Systems:  neg     Physical Exam:    Today's Vitals   11/02/22 1444  BP: 110/72  Pulse: 60  Weight: 227 lb (103 kg)  Height:  (1.473 m)   Body mass index is 47.44 kg/m.  Constitutional:  Well-developed, in no acute distress. Psychiatric: Normal mood and affect. Behavior is normal. HEENT: Pupils normal.  Conjunctivae are normal. No scleral icterus. Cardiovascular: Normal rate, regular rhythm. No edema Pulmonary/chest: Effort normal and breath sounds normal. No wheezing, rales or rhonchi. Abdominal: Soft, nondistended. Nontender. Bowel sounds active throughout. There are no masses palpable. No hepatomegaly.  Well-healed surgical scars.  Abdominal wall ventral hernia left lower quadrant at the site of previous colostomy. Rectal:  defered Neurological: Alert and oriented to person place and time. Skin: Skin is warm and dry. No rashes noted.  Data Reviewed: I have personally reviewed following labs and imaging studies  CBC:    Latest Ref Rng & Units 06/26/2019    3:49 PM 03/27/2019    3:04 PM 03/15/2019    3:05 AM  CBC  WBC 3.4 - 10.8 x10E3/uL 7.0   8.4  7.6   Hemoglobin 11.1 - 15.9 g/dL 16.1  9.1  8.7   Hematocrit 34.0 - 46.6 % 40.7  28.3  25.7   Platelets 150 - 450 x10E3/uL 269  510  163     CMP:    Latest Ref Rng & Units 07/20/2022    4:16 PM 05/03/2021   10:03 AM 07/05/2020    2:39 PM  CMP  Glucose 70 - 99 mg/dL 096  96  83   BUN 8 - 27 mg/dL Creatinine 0.57 - 1.00 mg/dL 0.45  4.09  8.11   Sodium 134 - 144 mmol/L 140  142  140   Potassium 3.5 - 5.2 mmol/L 5.2  4.5  5.1   Chloride 96 - 106 mmol/L 102  105  102   CO2 20 - 29 mmol/L Calcium 8.7 - 10.3 mg/dL 9.7  91.4  9.9   Total Protein 6.0 - 8.5 g/dL 6.7  6.3  6.6   Total Bilirubin 0.0 - 1.2 mg/dL 0.3  0.5  0.4   Alkaline Phos 44 - 121 IU/L 81  67  67   AST 0 - 40 IU/L ALT 0 - 32 IU/L Edman Circle, MD 11/02/2022, 3:00 PM  Cc: Noni Saupe, MD

## 2022-11-05 ENCOUNTER — Telehealth: Payer: Self-pay | Admitting: *Deleted

## 2022-11-05 NOTE — Telephone Encounter (Signed)
Pt has been scheduled for tele pre op appt 11/29/22 @ 2 pm. Med rec and consent are done.     Patient Consent for Virtual Visit        Tammy Hunt has provided verbal consent on 11/05/2022 for a virtual visit (video or telephone).   CONSENT FOR VIRTUAL VISIT FOR:  Tammy Hunt  By participating in this virtual visit I agree to the following:  I hereby voluntarily request, consent and authorize [facility] and its employed or contracted physicians, physician assistants, nurse practitioners or other licensed health care professionals (the Practitioner), to provide me with telemedicine health care services (the "Services") as deemed necessary by the treating Practitioner. I acknowledge and consent to receive the Services by the Practitioner via telemedicine. I understand that the telemedicine visit will involve communicating with the Practitioner through live audiovisual communication technology and the disclosure of certain medical information by electronic transmission. I acknowledge that I have been given the opportunity to request an in-person assessment or other available alternative prior to the telemedicine visit and am voluntarily participating in the telemedicine visit.  I understand that I have the right to withhold or withdraw my consent to the use of telemedicine in the course of my care at any time, without affecting my right to future care or treatment, and that the Practitioner or I may terminate the telemedicine visit at any time. I understand that I have the right to inspect all information obtained and/or recorded in the course of the telemedicine visit and may receive copies of available information for a reasonable fee.  I understand that some of the potential risks of receiving the Services via telemedicine include:  Delay or interruption in medical evaluation due to technological equipment failure or disruption; Information transmitted may not be sufficient (e.g. poor  resolution of images) to allow for appropriate medical decision making by the Practitioner; and/or  In rare instances, security protocols could fail, causing a breach of personal health information.  Furthermore, I acknowledge that it is my responsibility to provide information about my medical history, conditions and care that is complete and accurate to the best of my ability. I acknowledge that Practitioner's advice, recommendations, and/or decision may be based on factors not within their control, such as incomplete or inaccurate data provided by me or distortions of diagnostic images or specimens that may result from electronic transmissions. I understand that the practice of medicine is not an exact science and that Practitioner makes no warranties or guarantees regarding treatment outcomes. I acknowledge that a copy of this consent can be made available to me via my patient portal [facility] MyChart), or I can request a printed copy by calling the office of [facility].    I understand that my insurance will be billed for this visit.   I have read or had this consent read to me. I understand the contents of this consent, which adequately explains the benefits and risks of the Services being provided via telemedicine.  I have been provided ample opportunity to ask questions regarding this consent and the Services and have had my questions answered to my satisfaction. I give my informed consent for the services to be provided through the use of telemedicine in my medical care

## 2022-11-05 NOTE — Telephone Encounter (Signed)
Primary Cardiologist:Brian Dulce Sellar, MD   Preoperative team, please contact this patient and set up a phone call appointment for further preoperative risk assessment. Please obtain consent and complete medication review. Thank you for your help.   I confirm that guidance regarding antiplatelet and oral anticoagulation therapy has been completed and, if necessary, noted below.   Levi Aland, NP-C  11/05/2022, 8:17 AM [address] Office [phone] Fax [phone]

## 2022-11-05 NOTE — Telephone Encounter (Signed)
Pt has been scheduled for tele pre op appt 11/29/22 @ 2 pm. Med rec and consent are done.

## 2022-11-29 ENCOUNTER — Ambulatory Visit: Payer: Medicare PPO | Attending: Interventional Cardiology | Admitting: Nurse Practitioner

## 2022-11-29 ENCOUNTER — Encounter: Payer: Self-pay | Admitting: Nurse Practitioner

## 2022-11-29 DIAGNOSIS — Z01810 Encounter for preprocedural cardiovascular examination: Secondary | ICD-10-CM | POA: Diagnosis not present

## 2022-11-29 NOTE — Progress Notes (Signed)
Virtual Visit via Telephone Note   Because of ALLANTE SLIWA co-morbid illnesses, she is at least at moderate risk for complications without adequate follow up.  This format is felt to be most appropriate for this patient at this time.  The patient did not have access to video technology/had technical difficulties with video requiring transitioning to audio format only (telephone).  All issues noted in this document were discussed and addressed.  No physical exam could be performed with this format.  Please refer to the patient's chart for her consent to telehealth for [facility].  Evaluation Performed:  Preoperative cardiovascular risk assessment _____________   Date:  11/29/2022   Patient ID:  Tammy Hunt, DOB March 24, 1946, MRN [id number] Patient Location:  Home Provider location:   Office  Primary Care Provider:  Noni Saupe, MD Primary Cardiologist:  Norman Herrlich, MD  Chief Complaint / Patient Profile   77 y.o. y/o female with a h/o severe aortic stenosis with AVR 02/2021, mild CAD, HLD who is pending EGD and presents today for telephonic preoperative cardiovascular risk assessment.  History of Present Illness    Tammy Hunt is a 77 y.o. female who presents via audio/video conferencing for a telehealth visit today.  Pt was last seen in cardiology clinic on 07/20/22 by Dr. Dulce Sellar.  At that time Tammy Hunt was doing well.  The patient is now pending procedure as outlined above. Since her last visit, she denies chest pain, shortness of breath, lower extremity edema, fatigue, palpitations, melena, hematuria, hemoptysis, diaphoresis, weakness, presyncope, syncope, orthopnea, and PND. She admits she has not been very active since COVID pandemic. She is able to achieve > 4 METS activity with house work and does not have any concerning cardiac symptoms.    Past Medical History    Past Medical History:  Diagnosis Date   Anxiety    Arthritis    COVID-19 virus  infection 02/2020   had vaccine in March 2021 and breakthough in 02/2020 and the antibodiy infusion    Depression    Diverticulitis    Heart murmur    Hyperlipidemia    Mild nonobstructive coronary artery disease    (a) 02/13/19 cath with 40% stenosis to LAD and RCA - mild nonobstructive disease rec for medical mgmt   Mitral valve regurgitation    Osteopenia    Palpitations    Papilloma of breast    S/P AVR (aortic valve replacement) 03/12/2019   (a) SAVR with 21mm Edwards INSPIRIS RESILIA percardial valve   Severe aortic stenosis    s/p SAVR 03/12/19   Thyroid disease    Past Surgical History:  Procedure Laterality Date   AORTIC VALVE REPLACEMENT N/A 03/12/2019   Procedure: AORTIC VALVE REPLACEMENT (AVR);  Surgeon: Alleen Borne, MD;  Location: [facility] OR;  Service: Open Heart Surgery;  Laterality: N/A;   BIOPSY THYROID  07/2013   BOWEL RESECTION     BREAST SURGERY     Left breast   CESAREAN SECTION     CHOLECYSTECTOMY     COLONOSCOPY  07/26/2014   Colonic polyps statuas post polypectomy. Mild neo diverticulosis. External hemorrhoids.    COLOSTOMY TAKEDOWN     CORONARY ANGIOPLASTY     FINGER SURGERY     LEFT HEART CATH AND CORONARY ANGIOGRAPHY N/A 02/13/2019   Procedure: LEFT HEART CATH AND CORONARY ANGIOGRAPHY;  Surgeon: Tonny Bollman, MD;  Location: [facility] INVASIVE CV LAB;  Service: Cardiovascular;  Laterality: N/A;   REPLACEMENT  TOTAL KNEE Left 01/2022   TEE WITHOUT CARDIOVERSION N/A 03/12/2019   Procedure: TRANSESOPHAGEAL ECHOCARDIOGRAM (TEE);  Surgeon: Alleen Borne, MD;  Location: [facility] OR;  Service: Open Heart Surgery;  Laterality: N/A;   TUBAL LIGATION      Allergies  No Known Allergies  Home Medications    Prior to Admission medications   Medication Sig Start Date End Date Taking? Authorizing Provider  aspirin EC 81 MG tablet Take 81 mg by mouth daily. Swallow whole.    [provider]  buPROPion (WELLBUTRIN XL) 300 MG 24 hr tablet Take 300 mg by mouth  daily.  11/12/17   [provider]  Calcium Carbonate (CALCIUM 500 PO) Take 1 tablet by mouth daily. Patient not taking: Reported on 11/05/2022    [provider]  Calcium Carbonate-Vitamin D (CALCIUM 600/VITAMIN D PO) Take 1 tablet by mouth daily.    [provider]  clonazePAM (KLONOPIN) 0.5 MG tablet Take 0.5 mg by mouth daily as needed for anxiety.     [provider]  ezetimibe (ZETIA) 10 MG tablet Take 1 tablet (10 mg total) by mouth daily. 07/20/22   Baldo Daub, MD  ibandronate (BONIVA) 150 MG tablet Take 1 tablet (150 mg total) by mouth every 30 (thirty) days. Take in the morning with a full glass of water, on an empty stomach, and do not take anything else by mouth or lie down for the next 30 min. 07/23/22   Olivia Mackie, NP  ibuprofen (ADVIL) 200 MG tablet Take 200 mg by mouth daily as needed (knee pain).    [provider]  levothyroxine (SYNTHROID, LEVOTHROID) 75 MCG tablet Take 1 tablet (75 mcg total) by mouth daily before breakfast. 06/29/17   Romero Belling, MD  Magnesium 500 MG TABS Take 500 mg by mouth daily.     [provider]  Magnesium Oxide -Mg Supplement 250 MG TABS Take by mouth.    [provider]  metoprolol tartrate (LOPRESSOR) 25 MG tablet Take 0.5 tablets (12.5 mg total) by mouth 2 (two) times daily. 07/20/22   Baldo Daub, MD  Omega-3 Fatty Acids (FISH OIL) 1000 MG CAPS Take 2,000 mg by mouth 2 (two) times daily.     [provider]  ondansetron (ZOFRAN-ODT) 4 MG disintegrating tablet Take 1 tablet (4 mg total) by mouth every 8 (eight) hours as needed for nausea or vomiting. Patient not taking: Reported on 11/05/2022 10/25/22   Lynann Bologna, MD  ondansetron (ZOFRAN-ODT) 4 MG disintegrating tablet Take 1 tablet (4 mg total) by mouth every 8 (eight) hours as needed for nausea or vomiting. 11/02/22   Lynann Bologna, MD  pantoprazole (PROTONIX) 40 MG tablet Take 1 tablet (40 mg total) by mouth daily.  10/25/22   Lynann Bologna, MD  polyethylene glycol (MIRALAX / GLYCOLAX) 17 g packet Take 17 g by mouth daily.    [provider]  rosuvastatin (CRESTOR) 20 MG tablet Take 1 tablet (20 mg total) by mouth daily. 07/23/22   Baldo Daub, MD    Physical Exam    Vital Signs:  Nilda Simmer does not have vital signs available for review today.  Given telephonic nature of communication, physical exam is limited. AAOx3. NAD. Normal affect.  Speech and respirations are unlabored.  Accessory Clinical Findings    None  Assessment & Plan    1.  Preoperative Cardiovascular Risk Assessment: According to the Revised Cardiac Risk Index (RCRI), her Perioperative Risk of Major Cardiac  Event is (%): 0.4. Her Functional Capacity in METs is: 5.07 according to the Duke Activity Status Index (DASI). The patient is doing well from a cardiac perspective. Therefore, based on ACC/AHA guidelines, the patient would be at acceptable risk for the planned procedure without further cardiovascular testing.   The patient was advised that if she develops new symptoms prior to surgery to contact our office to arrange for a follow-up visit, and she verbalized understanding.  No cardiac medications to hold  A copy of this note will be routed to requesting surgeon.  Time:   Today, I have spent 10 minutes with the patient with telehealth technology discussing medical history, symptoms, and management plan.    Levi Aland, NP-C  11/29/2022, 2:02 PM [address], [address] Office [phone] Fax [phone]

## 2022-11-29 NOTE — Telephone Encounter (Signed)
Patient is cleared to have procedure

## 2022-12-22 ENCOUNTER — Encounter: Payer: Self-pay | Admitting: Gastroenterology

## 2022-12-27 ENCOUNTER — Ambulatory Visit (AMBULATORY_SURGERY_CENTER): Payer: Medicare PPO | Admitting: Gastroenterology

## 2022-12-27 ENCOUNTER — Encounter: Payer: Self-pay | Admitting: Gastroenterology

## 2022-12-27 VITALS — BP 132/64 | HR 59 | Temp 98.0°F | Resp 14 | Ht <= 58 in | Wt 227.0 lb

## 2022-12-27 DIAGNOSIS — R112 Nausea with vomiting, unspecified: Secondary | ICD-10-CM

## 2022-12-27 DIAGNOSIS — K2970 Gastritis, unspecified, without bleeding: Secondary | ICD-10-CM

## 2022-12-27 DIAGNOSIS — K222 Esophageal obstruction: Secondary | ICD-10-CM

## 2022-12-27 DIAGNOSIS — K2950 Unspecified chronic gastritis without bleeding: Secondary | ICD-10-CM | POA: Diagnosis not present

## 2022-12-27 MED ORDER — SODIUM CHLORIDE 0.9 % IV SOLN
500.0000 mL | INTRAVENOUS | Status: DC
Start: 2022-12-27 — End: 2022-12-27

## 2022-12-27 NOTE — Patient Instructions (Signed)
YOU HAD AN ENDOSCOPIC PROCEDURE TODAY AT THE [facility]:   Refer to the procedure report that was given to you for any specific questions about what was found during the examination.  If the procedure report does not answer your questions, please call your gastroenterologist to clarify.  If you requested that your care partner not be given the details of your procedure findings, then the procedure report has been included in a sealed envelope for you to review at your convenience later.  YOU SHOULD EXPECT: Some feelings of bloating in the abdomen. Passage of more gas than usual.  Walking can help get rid of the air that was put into your GI tract during the procedure and reduce the bloating. If you had a lower endoscopy (such as a colonoscopy or flexible sigmoidoscopy) you may notice spotting of blood in your stool or on the toilet paper. If you underwent a bowel prep for your procedure, you may not have a normal bowel movement for a few days.  Please Note:  You might notice some irritation and congestion in your nose or some drainage.  This is from the oxygen used during your procedure.  There is no need for concern and it should clear up in a day or so.  SYMPTOMS TO REPORT IMMEDIATELY:   Following upper endoscopy (EGD)  Vomiting of blood or coffee ground material  New chest pain or pain under the shoulder blades  Painful or persistently difficult swallowing  New shortness of breath  Fever of 100F or higher  Black, tarry-looking stools  For urgent or emergent issues, a gastroenterologist can be reached at any hour by calling [phone]. Do not use MyChart messaging for urgent concerns.    DIET:  We do recommend a small meal at first, but then you may proceed to your regular diet.  Drink plenty of fluids but you should avoid alcoholic beverages for 24 hours.  ACTIVITY:  You should plan to take it easy for the rest of today and you should NOT DRIVE or use heavy machinery  until tomorrow (because of the sedation medicines used during the test).    FOLLOW UP: Our staff will call the number listed on your records the next business day following your procedure.  We will call around 7:15- 8:00 am to check on you and address any questions or concerns that you may have regarding the information given to you following your procedure. If we do not reach you, we will leave a message.     If any biopsies were taken you will be contacted by phone or by letter within the next 1-3 weeks.  Please call us at [phone] if you have not heard about the biopsies in 3 weeks.    SIGNATURES/CONFIDENTIALITY: You and/or your care partner have signed paperwork which will be entered into your electronic medical record.  These signatures attest to the fact that that the information above on your After Visit Summary has been reviewed and is understood.  Full responsibility of the confidentiality of this discharge information lies with you and/or your care-partner. 

## 2022-12-27 NOTE — Progress Notes (Signed)
Called to room to assist during endoscopic procedure.  Patient ID and intended procedure confirmed with present staff. Received instructions for my participation in the procedure from the performing physician.  

## 2022-12-27 NOTE — Op Note (Signed)
[facility] Patient Name: Tammy Hunt Procedure Date: 12/27/2022 9:50 AM MRN: [id number] Endoscopist: Lynann Bologna , MD, [id number] Age: 77 Referring MD:  Date of Birth: 30-Jan-1946 Gender: Female Account #: [id number] Procedure:                Upper GI endoscopy Indications:              N/V Medicines:                Monitored Anesthesia Care Procedure:                Pre-Anesthesia Assessment:                           - Prior to the procedure, a History and Physical                            was performed, and patient medications and                            allergies were reviewed. The patient's tolerance of                            previous anesthesia was also reviewed. The risks                            and benefits of the procedure and the sedation                            options and risks were discussed with the patient.                            All questions were answered, and informed consent                            was obtained. Prior Anticoagulants: The patient has                            taken no anticoagulant or antiplatelet agents. ASA                            Grade Assessment: III - A patient with severe                            systemic disease. After reviewing the risks and                            benefits, the patient was deemed in satisfactory                            condition to undergo the procedure.                           After obtaining informed consent, the endoscope was  passed under direct vision. Throughout the                            procedure, the patient's blood pressure, pulse, and                            oxygen saturations were monitored continuously. The                            Olympus Scope [id number] was introduced through the                            mouth, and advanced to the second part of duodenum.                            The upper GI endoscopy was accomplished without                             difficulty. The patient tolerated the procedure                            well. Scope In: Scope Out: Findings:                 The lower third of the esophagus was mildly                            tortuous d/t HH.                           A non-obstructing and mild Schatzki ring was found                            at the gastroesophageal junction, 32 cm from the                            incisors.                           A 2 cm hiatal hernia was present.                           Localized mild inflammation characterized by                            erythema was found in the gastric antrum. Biopsies                            were taken with a cold forceps for histology.                           The examined duodenum was normal. Biopsies for                            histology were taken with a cold forceps for  evaluation of celiac disease. Complications:            No immediate complications. Estimated Blood Loss:     Estimated blood loss: none. Impression:               - Non-obstructing and mild Schatzki ring.                           - 2 cm hiatal hernia.                           - Gastritis. Biopsied.                           - Normal examined duodenum. Biopsied. Recommendation:           - Patient has a contact number available for                            emergencies. The signs and symptoms of potential                            delayed complications were discussed with the                            patient. Return to normal activities tomorrow.                            Written discharge instructions were provided to the                            patient.                           - Resume previous diet.                           - Continue present medications including Protonix                            40 mg p.o. daily.                           - Await pathology results.                           -  She overall feels much better. Continue to use                            Zofran on as-needed basis.                           - The findings and recommendations were discussed                            with the patient's family. Lynann Bologna, MD 12/27/2022 10:04:49 AM This report has been signed electronically.

## 2022-12-27 NOTE — Progress Notes (Signed)
Chief Complaint: Discuss colonoscopy  Referring Provider:  Noni Saupe, MD      ASSESSMENT AND PLAN;   #1. Episodic N/V- better now. ?etiology  #2. H/O diverticulitis s/p Hartman's procedure 2008 followed by takedown of ostomy with LOA (Dr Landis Martins)  #3. 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 CTA chest/Abdo/pelvis- large infraumbilical ventral hernia containing multiple loops of small bowel.  No bowel incarceration or obstruction.  Plan:  EGD today. Cleared by cardiology   -Continue protonix 40mg  po QD -Zofran 4 mg ODT Q8hrs prn #20, 2RF  with -If any abdo pain or recurrent episodes, CT AP with contrast at that time.   Risks and benefits were discussed.  She does satisfy [facility] criteria.  HPI:    Tammy Hunt is a 77 y.o. female   Multiple medical problems as below including S/P TAVR for severe aortic stenosis 03/12/2019 (bioprosthetic valve, no AC) Doing much better from cardiac standpoint.  Seen recently by Dr. Dulce Sellar.  Echo showed normal EF 06/2021  Had 3 discrete episodes of N/V  Jan 26, March 6, April11  After breakfast first 2 times and then April 11 episode-6 AM which woke her up.  She had vomiting until late afternoon.  It spontaneously got better.  She could not identify any definite triggering foods.  No abdominal pain.  Denies having any significant heartburn.  No fever chills or night sweats.  No recent travel.  No exposure to anyone with gastroenteritis.  She called.  Omeprazole was changed to Protonix.  She was also given Zofran.  She did not have any further attacks since.  However, quite concerned about any problems especially any cancers of the stomach.  No weight loss.  She has been moving her bowels okay.   Past GI workup:  Colonoscopy 07/19/2020  - One 4 mm polyp in the cecum, removed with a cold biopsy forceps. Resected and retrieved. - Patent end-to-end colo-colonic  anastomosis, characterized by healthy appearing mucosa. - Diverticulosis in the distal sigmoid colon. - Non-bleeding internal hemorrhoids. - The examination was otherwise normal on direct and retroflexion views -Bx- TA -No need to rpt d/t age   CTA 02/2019 1. Vascular findings and measurements pertinent to potential T AVR procedure, as detailed above. 2. Severe thickening calcification of the aortic valve, compatible with the reported clinical history of severe aortic stenosis. 3. Aortic atherosclerosis, in addition to left main and 2 vessel coronary artery disease. 4. Colonic diverticulosis without evidence of acute diverticulitis at this time. 5. Large infraumbilical ventral hernia containing multiple loops of small bowel. No associated bowel incarceration or obstruction at this time. In addition, there is a small inferior left Spigelian hernia containing only fat. 6. Additional incidental findings, as above. Past Medical History:  Diagnosis Date   Anxiety    Arthritis    COVID-19 virus infection 02/2020   had vaccine in March 2021 and breakthough in 02/2020 and the antibodiy infusion    Depression    Diverticulitis    Heart murmur    Hyperlipidemia    Mild nonobstructive coronary artery disease    (a) 02/13/19 cath with 40% stenosis to LAD and RCA - mild nonobstructive disease rec for medical mgmt   Mitral valve regurgitation    Osteopenia    Palpitations    Papilloma of breast    S/P AVR (aortic valve replacement) 03/12/2019   (a) SAVR with 21mm Edwards INSPIRIS RESILIA percardial valve  Severe aortic stenosis    s/p SAVR 03/12/19   Thyroid disease     Past Surgical History:  Procedure Laterality Date   AORTIC VALVE REPLACEMENT N/A 03/12/2019   Procedure: AORTIC VALVE REPLACEMENT (AVR);  Surgeon: Alleen Borne, MD;  Location: [facility] OR;  Service: Open Heart Surgery;  Laterality: N/A;   BIOPSY THYROID  07/2013   BOWEL RESECTION     BREAST SURGERY     Left breast    CESAREAN SECTION     CHOLECYSTECTOMY     COLONOSCOPY  07/26/2014   Colonic polyps statuas post polypectomy. Mild neo diverticulosis. External hemorrhoids.    COLOSTOMY TAKEDOWN     CORONARY ANGIOPLASTY     FINGER SURGERY     LEFT HEART CATH AND CORONARY ANGIOGRAPHY N/A 02/13/2019   Procedure: LEFT HEART CATH AND CORONARY ANGIOGRAPHY;  Surgeon: Tonny Bollman, MD;  Location: [facility] INVASIVE CV LAB;  Service: Cardiovascular;  Laterality: N/A;   REPLACEMENT TOTAL KNEE Left 01/2022   TEE WITHOUT CARDIOVERSION N/A 03/12/2019   Procedure: TRANSESOPHAGEAL ECHOCARDIOGRAM (TEE);  Surgeon: Alleen Borne, MD;  Location: [facility] OR;  Service: Open Heart Surgery;  Laterality: N/A;   TUBAL LIGATION      Family History  Problem Relation Age of Onset   Lung cancer Mother    Cancer Mother        lung-smoker   Heart disease Father    Breast cancer Maternal Aunt        age 76   Cancer Maternal Aunt        lymphoma   Cancer Maternal Uncle        lung   Cancer Maternal Grandmother        liver   Colon cancer Neg Hx    Esophageal cancer Neg Hx     Social History   Tobacco Use   Smoking status: Former    Types: Cigarettes    Quit date: 07/19/2005    Years since quitting: 17.4   Smokeless tobacco: Never  Vaping Use   Vaping Use: Never used  Substance Use Topics   Alcohol use: No   Drug use: No    Current Outpatient Medications  Medication Sig Dispense Refill   aspirin EC 81 MG tablet Take 81 mg by mouth daily. Swallow whole.     buPROPion (WELLBUTRIN XL) 300 MG 24 hr tablet Take 300 mg by mouth daily.   0   Calcium Carbonate-Vitamin D (CALCIUM 600/VITAMIN D PO) Take 1 tablet by mouth daily.     clonazePAM (KLONOPIN) 0.5 MG tablet Take 0.5 mg by mouth daily as needed for anxiety.      ezetimibe (ZETIA) 10 MG tablet Take 1 tablet (10 mg total) by mouth daily. 90 tablet 3   levothyroxine (SYNTHROID, LEVOTHROID) 75 MCG tablet Take 1 tablet (75 mcg total) by mouth daily before breakfast. 90 tablet  3   Magnesium 500 MG TABS Take 500 mg by mouth daily.      Magnesium Oxide -Mg Supplement 250 MG TABS Take by mouth.     metoprolol tartrate (LOPRESSOR) 25 MG tablet Take 0.5 tablets (12.5 mg total) by mouth 2 (two) times daily. 90 tablet 3   Omega-3 Fatty Acids (FISH OIL) 1000 MG CAPS Take 2,000 mg by mouth 2 (two) times daily.      ondansetron (ZOFRAN-ODT) 4 MG disintegrating tablet Take 1 tablet (4 mg total) by mouth every 8 (eight) hours as needed for nausea or vomiting. 20 tablet 2  pantoprazole (PROTONIX) 40 MG tablet Take 1 tablet (40 mg total) by mouth daily. 90 tablet 0   polyethylene glycol (MIRALAX / GLYCOLAX) 17 g packet Take 17 g by mouth daily.     rosuvastatin (CRESTOR) 20 MG tablet Take 1 tablet (20 mg total) by mouth daily. 90 tablet 3   Calcium Carbonate (CALCIUM 500 PO) Take 1 tablet by mouth daily. (Patient not taking: Reported on 11/05/2022)     ibandronate (BONIVA) 150 MG tablet Take 1 tablet (150 mg total) by mouth every 30 (thirty) days. Take in the morning with a full glass of water, on an empty stomach, and do not take anything else by mouth or lie down for the next 30 min. 3 tablet 3   ibuprofen (ADVIL) 200 MG tablet Take 200 mg by mouth daily as needed (knee pain).     ondansetron (ZOFRAN-ODT) 4 MG disintegrating tablet Take 1 tablet (4 mg total) by mouth every 8 (eight) hours as needed for nausea or vomiting. (Patient not taking: Reported on 11/05/2022) 20 tablet 2   Current Facility-Administered Medications  Medication Dose Route Frequency Provider Last Rate Last Admin   0.9 %  sodium chloride infusion  500 mL Intravenous Continuous Lynann Bologna, MD        No Known Allergies  Review of Systems:  neg     Physical Exam:    Today's Vitals   12/27/22 0905 12/27/22 0907  BP: (!) 158/70   Pulse: 63   Temp: 98 F (36.7 C) 98 F (36.7 C)  SpO2: 96%   Weight: 227 lb (103 kg)   Height: 4\' 10"  (1.473 m)    Body mass index is 47.44 kg/m.  Constitutional:   Well-developed, in no acute distress. Psychiatric: Normal mood and affect. Behavior is normal. HEENT: Pupils normal.  Conjunctivae are normal. No scleral icterus. Cardiovascular: Normal rate, regular rhythm. No edema Pulmonary/chest: Effort normal and breath sounds normal. No wheezing, rales or rhonchi. Abdominal: Soft, nondistended. Nontender. Bowel sounds active throughout. There are no masses palpable. No hepatomegaly.  Well-healed surgical scars.  Abdominal wall ventral hernia left lower quadrant at the site of previous colostomy. Rectal:  defered Neurological: Alert and oriented to person place and time. Skin: Skin is warm and dry. No rashes noted.  Data Reviewed: I have personally reviewed following labs and imaging studies  CBC:    Latest Ref Rng & Units 06/26/2019    3:49 PM 03/27/2019    3:04 PM 03/15/2019    3:05 AM  CBC  WBC 3.4 - 10.8 x10E3/uL 7.0  8.4  7.6   Hemoglobin 11.1 - 15.9 g/dL 16.1  9.1  8.7   Hematocrit 34.0 - 46.6 % 40.7  28.3  25.7   Platelets 150 - 450 x10E3/uL 269  510  163     CMP:    Latest Ref Rng & Units 07/20/2022    4:16 PM 05/03/2021   10:03 AM 07/05/2020    2:39 PM  CMP  Glucose 70 - 99 mg/dL 096  96  83   BUN 8 - 27 mg/dL 16  13  17    Creatinine 0.57 - 1.00 mg/dL 0.45  4.09  8.11   Sodium 134 - 144 mmol/L 140  142  140   Potassium 3.5 - 5.2 mmol/L 5.2  4.5  5.1   Chloride 96 - 106 mmol/L 102  105  102   CO2 20 - 29 mmol/L 23  23  24    Calcium 8.7 - 10.3  mg/dL 9.7  29.5  9.9   Total Protein 6.0 - 8.5 g/dL 6.7  6.3  6.6   Total Bilirubin 0.0 - 1.2 mg/dL 0.3  0.5  0.4   Alkaline Phos 44 - 121 IU/L 81  67  67   AST 0 - 40 IU/L 17  19  21    ALT 0 - 32 IU/L 18  26  28       Edman Circle, MD 12/27/2022, 9:47 AM  Cc: Noni Saupe, MD

## 2022-12-27 NOTE — Progress Notes (Signed)
Pt resting comfortably. VSS. Airway intact. SBAR complete to RN. All questions answered.   

## 2022-12-28 ENCOUNTER — Telehealth: Payer: Self-pay

## 2022-12-28 NOTE — Telephone Encounter (Signed)
  Follow up Call-     12/27/2022    9:07 AM 07/19/2020    1:08 PM  Call back number  Post procedure Call Back phone  # [phone]  Permission to leave phone message Yes Yes     Patient questions:  Do you have a fever, pain , or abdominal swelling? No. Pain Score  0 *  Have you tolerated food without any problems? Yes.    Have you been able to return to your normal activities? Yes.    Do you have any questions about your discharge instructions: Diet   No. Medications  No. Follow up visit  No.  Do you have questions or concerns about your Care? No.  Actions: * If pain score is 4 or above: No action needed, pain <4.

## 2023-01-11 ENCOUNTER — Encounter: Payer: Self-pay | Admitting: Gastroenterology

## 2023-01-18 ENCOUNTER — Other Ambulatory Visit: Payer: Self-pay | Admitting: Gastroenterology

## 2023-05-31 ENCOUNTER — Telehealth: Payer: Self-pay

## 2023-05-31 NOTE — Telephone Encounter (Signed)
Pt LVM in triage line asking if she needs to receive her DEXA scan prior to scheduling or seeing TW when she is due in January?  Also reports that she would like to go to [facility] for DEXA since she is going there for years.  Please advise on initial inquiry. Thanks.

## 2023-06-03 NOTE — Telephone Encounter (Signed)
Yes, DXA recommended prior to B&P exam, so we can discuss and provide refills at that time. Thanks.

## 2023-06-03 NOTE — Telephone Encounter (Signed)
Spoke with patient, advised per TW. Patient will call to schedule BMD then return call to schedule B&P exam. Questions answered.    [facility] order for BMD to TW for signature and then faxed.   Routing to provider for final review. Patient is agreeable to disposition. Will close encounter.

## 2023-06-04 NOTE — Telephone Encounter (Signed)
DEXA order signed by TW and faxed to [facility] successfully.

## 2023-06-07 NOTE — Telephone Encounter (Signed)
We received an inquiry/report back from [facility] that the pt just had DEXA done in 07/19/2022-osteoporosis (T-score -2.7; 74yr fracture risk 14% & hip fracture is 4.4%)  Per TW: "Pt does not need another DEXA at this time. Please contact [facility] to cancel order."  Spoke w/ Sheena at [facility] and she voiced understanding and placed notes in pt's record.  Pt's report in EMR.    Contacted pt to make her aware and pt was transferred to appt desk to schedule OV for med refills in 07/2023 since pt is "LR" MCR.   Msg sent to appt desk.

## 2023-06-07 NOTE — Telephone Encounter (Signed)
Pt scheduled for 07/25/2023.

## 2023-07-17 DIAGNOSIS — L820 Inflamed seborrheic keratosis: Secondary | ICD-10-CM | POA: Diagnosis not present

## 2023-07-25 ENCOUNTER — Ambulatory Visit: Payer: Medicare PPO | Admitting: Nurse Practitioner

## 2023-07-25 DIAGNOSIS — Z1231 Encounter for screening mammogram for malignant neoplasm of breast: Secondary | ICD-10-CM | POA: Diagnosis not present

## 2023-07-30 ENCOUNTER — Ambulatory Visit: Payer: Medicare PPO | Admitting: Nurse Practitioner

## 2023-07-30 DIAGNOSIS — M810 Age-related osteoporosis without current pathological fracture: Secondary | ICD-10-CM | POA: Diagnosis not present

## 2023-07-30 MED ORDER — IBANDRONATE SODIUM 150 MG PO TABS: 150.0000 mg | ORAL_TABLET | ORAL | 3 refills | Status: DC

## 2023-07-30 NOTE — Progress Notes (Signed)
   Acute Office Visit  Subjective:    Patient ID: Tammy Hunt, female    DOB: [birth year], 78 y.o.   MRN: [id number]   HPI 78 y.o. presents today for medication management. Started Boniva  07/2022 for osteoporosis. Tolerating well. DXA 07/19/2022 T-score -2.7. Taking Vit D + Calcium . Used to do water exercises but did not do any this past year.   No LMP recorded. Patient is postmenopausal.    Review of Systems  Constitutional: Negative.        Objective:    Physical Exam Constitutional:      Appearance: Normal appearance.     BP 128/84   Pulse 77   Wt 235 lb (106.6 kg)   SpO2 99%   BMI 49.12 kg/m  Wt Readings from Last 3 Encounters:  07/30/23 235 lb (106.6 kg)  12/27/22 227 lb (103 kg)  11/02/22 227 lb (103 kg)        Assessment & Plan:   Problem List Items Addressed This Visit   None Visit Diagnoses       Age-related osteoporosis without current pathological fracture       Relevant Medications   ibandronate  (BONIVA ) 150 MG tablet      Plan: Tolerating Boniva . Continue Vit D + Calcium . Increase exercise. Will repeat DXA next January.   Return in about 1 year (around 07/29/2024) for B&P.    Tammy DELENA Shutter DNP, 2:12 PM 07/30/2023

## 2023-07-31 ENCOUNTER — Encounter: Payer: Self-pay | Admitting: Nurse Practitioner

## 2023-08-07 ENCOUNTER — Encounter: Payer: Self-pay | Admitting: Cardiology

## 2023-08-07 NOTE — Progress Notes (Unsigned)
Cardiology Office Note:    Date:  08/08/2023   ID:  Tammy Hunt, DOB 01-Oct-1945, MRN [id number]  PCP:  Noni Saupe, MD  Cardiologist:  Norman Herrlich, MD    Referring MD: Noni Saupe, MD    ASSESSMENT:    1. S/P AVR (aortic valve replacement)   2. Mild coronary artery disease   3. Mixed hyperlipidemia   4. Palpitations    PLAN:    In order of problems listed above:  Approaching 5 years plan echocardiogram in July to survey her surgical aortic valve no evidence of dysfunction continue aspirin Doing well no angina continue treatment with aspirin lipid-lowering with statin plus Zetia and beta-blocker Continue her beta-blocker no recent palpitation Upcoming labs with her PCP copy to me for lipids   Next appointment: 1 year   Medication Adjustments/Labs and Tests Ordered: Current medicines are reviewed at length with the patient today.  Concerns regarding medicines are outlined above.  Orders Placed This Encounter  Procedures   EKG 12-Lead   No orders of the defined types were placed in this encounter.    History of Present Illness:    Tammy Hunt is a 78 y.o. female with a hx of symptomatic severe aortic stenosis surgical AVR #21 Edwards Inspira's Resilia pericardial valve August 2022 mild CAD and mixed hyperlipidemia last seen 07/20/2022.  Compliance with diet, lifestyle and medications: Yes  Overall she is doing well without cardiovascular symptoms edema shortness of breath orthopnea chest pain palpitation or syncope She tolerates her lipid-lowering therapy high intensity statin without muscle pain or weakness She has a visceral hernia but is hesitant to consider surgical correction Past Medical History:  Diagnosis Date   Anxiety    Aortic insufficiency    Arthritis    COVID-19 virus infection 02/2020   had vaccine in March 2021 and breakthough in 02/2020 and the antibodiy infusion    Depression    Diverticulitis    Elevated cholesterol     Endometrial polyp 01/14/2015   Family history of thyroid disease 11/12/2012   H/O vitamin D deficiency 12/21/2014   Heart murmur    Hyperlipidemia    Hypothyroidism 12/21/2014   Intramural leiomyoma of uterus 04/27/2015   Mild nonobstructive coronary artery disease    (a) 02/13/19 cath with 40% stenosis to LAD and RCA - mild nonobstructive disease rec for medical mgmt   Mitral valve regurgitation    Multinodular goiter 07/22/2013   Non-rheumatic mitral regurgitation 02/16/2015   Overview:   Mild Aug 2016 echo     Nonrheumatic aortic valve insufficiency 02/16/2015   Overview:   Mild     Nonrheumatic aortic valve stenosis 02/16/2015   Obesity 03/28/2021   Osteopenia    Palpitations    Papilloma of breast    Parathyroid abnormality (HCC) 07/22/2013   S/P AVR 03/12/2019   S/P AVR (aortic valve replacement) 03/12/2019   (a) SAVR with 21mm Edwards INSPIRIS RESILIA percardial valve   Severe aortic stenosis    s/p SAVR 03/12/19   Thyroid disease    Unilateral primary osteoarthritis, left knee 03/28/2021   Vaginal atrophy 11/12/2012    Current Medications: Current Meds  Medication Sig   aspirin EC 81 MG tablet Take 81 mg by mouth daily. Swallow whole.   buPROPion (WELLBUTRIN XL) 300 MG 24 hr tablet Take 300 mg by mouth daily.    Calcium Carbonate (CALCIUM 500 PO) Take 1 tablet by mouth daily.   Calcium Carbonate-Vitamin D (CALCIUM 600/VITAMIN D  PO) Take 1 tablet by mouth daily.   clonazePAM (KLONOPIN) 0.5 MG tablet Take 0.5 mg by mouth daily as needed for anxiety.    ezetimibe (ZETIA) 10 MG tablet Take 1 tablet (10 mg total) by mouth daily.   ibandronate (BONIVA) 150 MG tablet Take 1 tablet (150 mg total) by mouth every 30 (thirty) days. Take in the morning with a full glass of water, on an empty stomach, and do not take anything else by mouth or lie down for the next 30 min.   ibuprofen (ADVIL) 200 MG tablet Take 200 mg by mouth daily as needed (knee pain).   levothyroxine (SYNTHROID,  LEVOTHROID) 75 MCG tablet Take 1 tablet (75 mcg total) by mouth daily before breakfast.   Magnesium 500 MG TABS Take 500 mg by mouth daily.    metoprolol tartrate (LOPRESSOR) 25 MG tablet Take 0.5 tablets (12.5 mg total) by mouth 2 (two) times daily.   Omega-3 Fatty Acids (FISH OIL) 1000 MG CAPS Take 2,000 mg by mouth 2 (two) times daily.    ondansetron (ZOFRAN-ODT) 4 MG disintegrating tablet Take 1 tablet (4 mg total) by mouth every 8 (eight) hours as needed for nausea or vomiting.   pantoprazole (PROTONIX) 40 MG tablet Take 1 tablet by mouth once daily.   polyethylene glycol (MIRALAX / GLYCOLAX) 17 g packet Take 17 g by mouth daily.   rosuvastatin (CRESTOR) 20 MG tablet Take 1 tablet (20 mg total) by mouth daily.      EKGs/Labs/Other Studies Reviewed:    The following studies were reviewed today:  EKG Interpretation Date/Time:  Thursday August 08 2023 15:24:32 EST Ventricular Rate:  70 PR Interval:  174 QRS Duration:  76 QT Interval:  404 QTC Calculation: 436 R Axis:   -6  Text Interpretation: Normal sinus rhythm ABNORMAL R WAVE PROGRESSION When compared with ECG of 13-Mar-2019 06:43, Non-specific change in ST segment in Anterolateral leads Unchanged Confirmed by Norman Herrlich ([id number]) on 08/08/2023 3:31:11 PM     EKG Interpretation Date/Time:  Thursday August 08 2023 15:24:32 EST Ventricular Rate:  70 PR Interval:  174 QRS Duration:  76 QT Interval:  404 QTC Calculation: 436 R Axis:   -6  Text Interpretation: Normal sinus rhythm ABNORMAL R WAVE PROGRESSION When compared with ECG of 13-Mar-2019 06:43, Non-specific change in ST segment in Anterolateral leads Unchanged Confirmed by Norman Herrlich ([id number]) on 08/08/2023 3:31:11 PM   Recent Labs: She has upcoming labs in her PCP office the first week February Recent Lipid Panel    Component Value Date/Time   CHOL 196 07/20/2022 1616   TRIG 163 (H) 07/20/2022 1616   HDL 60 07/20/2022 1616   CHOLHDL 3.3 07/20/2022 1616    LDLCALC 108 (H) 07/20/2022 1616    Physical Exam:    VS:  BP 134/66   Pulse 70   Ht 4\' 10"  (1.473 m)   Wt 237 lb 3.2 oz (107.6 kg)   SpO2 96%   BMI 49.57 kg/m     Wt Readings from Last 3 Encounters:  08/08/23 237 lb 3.2 oz (107.6 kg)  07/30/23 235 lb (106.6 kg)  12/27/22 227 lb (103 kg)     GEN:  Well nourished, well developed in no acute distress HEENT: Normal NECK: No JVD; No carotid bruits LYMPHATICS: No lymphadenopathy CARDIAC: Grade 1/6 to 2/6 systolic ejection murmur aortic area S2 normal no AR RRR, RESPIRATORY:  Clear to auscultation without rales, wheezing or rhonchi  ABDOMEN: Soft, non-tender, non-distended MUSCULOSKELETAL:  No edema;  No deformity  SKIN: Warm and dry NEUROLOGIC:  Alert and oriented x 3 PSYCHIATRIC:  Normal affect    Signed, Norman Herrlich, MD  08/08/2023 3:52 PM    [facility]

## 2023-08-08 ENCOUNTER — Ambulatory Visit: Payer: Medicare PPO | Attending: Cardiology | Admitting: Cardiology

## 2023-08-08 ENCOUNTER — Encounter: Payer: Self-pay | Admitting: Cardiology

## 2023-08-08 ENCOUNTER — Encounter: Payer: Self-pay | Admitting: Gastroenterology

## 2023-08-08 VITALS — BP 134/66 | HR 70 | Ht <= 58 in | Wt 237.2 lb

## 2023-08-08 DIAGNOSIS — E782 Mixed hyperlipidemia: Secondary | ICD-10-CM

## 2023-08-08 DIAGNOSIS — Z952 Presence of prosthetic heart valve: Secondary | ICD-10-CM | POA: Diagnosis not present

## 2023-08-08 DIAGNOSIS — R002 Palpitations: Secondary | ICD-10-CM

## 2023-08-08 DIAGNOSIS — I2510 Atherosclerotic heart disease of native coronary artery without angina pectoris: Secondary | ICD-10-CM

## 2023-08-08 NOTE — Patient Instructions (Signed)
 Medication Instructions:  Your physician recommends that you continue on your current medications as directed. Please refer to the Current Medication list given to you today.  *If you need a refill on your cardiac medications before your next appointment, please call your pharmacy*   Lab Work: None If you have labs (blood work) drawn today and your tests are completely normal, you will receive your results only by: MyChart Message (if you have MyChart) OR A paper copy in the mail If you have any lab test that is abnormal or we need to change your treatment, we will call you to review the results.   Testing/Procedures: Your physician has requested that you have an echocardiogram. Echocardiography is a painless test that uses sound waves to create images of your heart. It provides your doctor with information about the size and shape of your heart and how well your heart's chambers and valves are working. This procedure takes approximately one hour. There are no restrictions for this procedure. Please do NOT wear cologne, perfume, aftershave, or lotions (deodorant is allowed). Please arrive 15 minutes prior to your appointment time.  Please note: We ask at that you not bring children with you during ultrasound (echo/ vascular) testing. Due to room size and safety concerns, children are not allowed in the ultrasound rooms during exams. Our front office staff cannot provide observation of children in our lobby area while testing is being conducted. An adult accompanying a patient to their appointment will only be allowed in the ultrasound room at the discretion of the ultrasound technician under special circumstances. We apologize for any inconvenience.    Follow-Up: At [facility], you and your health needs are our priority.  As part of our continuing mission to provide you with exceptional heart care, we have created designated Provider Care Teams.  These Care Teams include your  primary Cardiologist (physician) and Advanced Practice Providers (APPs -  Physician Assistants and Nurse Practitioners) who all work together to provide you with the care you need, when you need it.  We recommend signing up for the patient portal called "MyChart".  Sign up information is provided on this After Visit Summary.  MyChart is used to connect with patients for Virtual Visits (Telemedicine).  Patients are able to view lab/test results, encounter notes, upcoming appointments, etc.  Non-urgent messages can be sent to your provider as well.   To learn more about what you can do with MyChart, go to [email or web address].    Your next appointment:   1 year(s)  Provider:   Norman Herrlich, MD    Other Instructions None

## 2023-08-11 NOTE — Telephone Encounter (Signed)
Ozempic can cause N/V in some pts. If Dr. Ralene Cork is ok with using Ozempic, we are fine. RG

## 2023-08-14 ENCOUNTER — Other Ambulatory Visit: Payer: Self-pay | Admitting: Cardiology

## 2023-08-14 DIAGNOSIS — I2510 Atherosclerotic heart disease of native coronary artery without angina pectoris: Secondary | ICD-10-CM

## 2023-08-14 DIAGNOSIS — Z952 Presence of prosthetic heart valve: Secondary | ICD-10-CM

## 2023-08-19 DIAGNOSIS — E785 Hyperlipidemia, unspecified: Secondary | ICD-10-CM | POA: Diagnosis not present

## 2023-08-19 DIAGNOSIS — Z131 Encounter for screening for diabetes mellitus: Secondary | ICD-10-CM | POA: Diagnosis not present

## 2023-08-19 DIAGNOSIS — E039 Hypothyroidism, unspecified: Secondary | ICD-10-CM | POA: Diagnosis not present

## 2023-08-25 ENCOUNTER — Encounter: Payer: Self-pay | Admitting: Cardiology

## 2023-08-28 DIAGNOSIS — M810 Age-related osteoporosis without current pathological fracture: Secondary | ICD-10-CM | POA: Diagnosis not present

## 2023-08-28 DIAGNOSIS — R7303 Prediabetes: Secondary | ICD-10-CM | POA: Diagnosis not present

## 2023-08-28 DIAGNOSIS — Z1339 Encounter for screening examination for other mental health and behavioral disorders: Secondary | ICD-10-CM | POA: Diagnosis not present

## 2023-08-28 DIAGNOSIS — Z0000 Encounter for general adult medical examination without abnormal findings: Secondary | ICD-10-CM | POA: Diagnosis not present

## 2023-09-05 ENCOUNTER — Other Ambulatory Visit: Payer: Self-pay | Admitting: Cardiology

## 2023-10-10 DIAGNOSIS — L820 Inflamed seborrheic keratosis: Secondary | ICD-10-CM | POA: Diagnosis not present

## 2023-12-02 DIAGNOSIS — L918 Other hypertrophic disorders of the skin: Secondary | ICD-10-CM | POA: Diagnosis not present

## 2023-12-02 DIAGNOSIS — L578 Other skin changes due to chronic exposure to nonionizing radiation: Secondary | ICD-10-CM | POA: Diagnosis not present

## 2023-12-02 DIAGNOSIS — L820 Inflamed seborrheic keratosis: Secondary | ICD-10-CM | POA: Diagnosis not present

## 2023-12-02 DIAGNOSIS — L814 Other melanin hyperpigmentation: Secondary | ICD-10-CM | POA: Diagnosis not present

## 2024-01-22 ENCOUNTER — Other Ambulatory Visit: Payer: Self-pay | Admitting: Gastroenterology

## 2024-01-29 ENCOUNTER — Ambulatory Visit: Payer: Medicare PPO | Attending: Cardiology

## 2024-01-29 DIAGNOSIS — E782 Mixed hyperlipidemia: Secondary | ICD-10-CM

## 2024-01-29 DIAGNOSIS — R002 Palpitations: Secondary | ICD-10-CM

## 2024-01-29 DIAGNOSIS — Z952 Presence of prosthetic heart valve: Secondary | ICD-10-CM

## 2024-01-29 DIAGNOSIS — I2510 Atherosclerotic heart disease of native coronary artery without angina pectoris: Secondary | ICD-10-CM

## 2024-01-30 LAB — ECHOCARDIOGRAM COMPLETE
AR max vel: 1.68 cm2
AV Area VTI: 2.15 cm2
AV Area mean vel: 1.99 cm2
AV Mean grad: 10 mmHg
AV Peak grad: 20.7 mmHg
Ao pk vel: 2.28 m/s
Area-P 1/2: 3.37 cm2
MV VTI: 2.08 cm2
S' Lateral: 3.1 cm

## 2024-02-16 ENCOUNTER — Encounter: Payer: Self-pay | Admitting: Cardiology

## 2024-04-01 ENCOUNTER — Encounter (HOSPITAL_BASED_OUTPATIENT_CLINIC_OR_DEPARTMENT_OTHER): Payer: Self-pay

## 2024-04-01 ENCOUNTER — Ambulatory Visit (HOSPITAL_BASED_OUTPATIENT_CLINIC_OR_DEPARTMENT_OTHER): Admission: EM | Admit: 2024-04-01 | Discharge: 2024-04-01 | Disposition: A | Discharge status: Home / Self Care

## 2024-04-01 DIAGNOSIS — H8111 Benign paroxysmal vertigo, right ear: Secondary | ICD-10-CM

## 2024-04-01 NOTE — Discharge Instructions (Signed)
 I have given you some information about vertigo.  Hopefully this will resolve.  He can use over-the-counter allergy medication for your congestion and sinus pressure. Follow-up with your doctor as needed for any continued issues

## 2024-04-01 NOTE — ED Triage Notes (Signed)
 Patient states onset Friday of severe vertigo. Was sitting and then stood up. States felt as if the world was spinning. Symptoms worse in the morning and evening and when rolling over in bed. Nausea today but relieved by medication. Patient's speech clear and appropriate. No vision disturbances. States able to ambulate but does feel a little unsteady at times.  Like I'm tipsy. Very mild headache.

## 2024-04-01 NOTE — ED Provider Notes (Signed)
 [facility]    CSN: [id number] Arrival date & time: 04/01/24  1500      History   Chief Complaint Chief Complaint  Patient presents with   Dizziness    HPI Tammy Hunt is a 78 y.o. female.   Patient states onset Friday of severe vertigo. Was sitting and then stood up. States felt as if the world was spinning. Symptoms worse in the morning and evening and when rolling over in bed. Nausea today but relieved by medication. Patient's speech clear and appropriate. No vision disturbances. States able to ambulate but does feel a little unsteady at times.  Like I'm tipsy. Very mild headache.    Dizziness   Past Medical History:  Diagnosis Date   Anxiety    Aortic insufficiency    Arthritis    COVID-19 virus infection 02/2020   had vaccine in March 2021 and breakthough in 02/2020 and the antibodiy infusion    Depression    Diverticulitis    Elevated cholesterol    Endometrial polyp 01/14/2015   Family history of thyroid  disease 11/12/2012   H/O vitamin D  deficiency 12/21/2014   Heart murmur    Hyperlipidemia    Hypothyroidism 12/21/2014   Intramural leiomyoma of uterus 04/27/2015   Mild nonobstructive coronary artery disease    (a) 02/13/19 cath with 40% stenosis to LAD and RCA - mild nonobstructive disease rec for medical mgmt   Mitral valve regurgitation    Multinodular goiter 07/22/2013   Non-rheumatic mitral regurgitation 02/16/2015   Overview:   Mild Aug 2016 echo     Nonrheumatic aortic valve insufficiency 02/16/2015   Overview:   Mild     Nonrheumatic aortic valve stenosis 02/16/2015   Obesity 03/28/2021   Osteopenia    Palpitations    Papilloma of breast    Parathyroid abnormality (HCC) 07/22/2013   S/P AVR 03/12/2019   S/P AVR (aortic valve replacement) 03/12/2019   (a) SAVR with 21mm Edwards INSPIRIS RESILIA percardial valve   Severe aortic stenosis    s/p SAVR 03/12/19   Thyroid  disease    Unilateral primary osteoarthritis, left knee  03/28/2021   Vaginal atrophy 11/12/2012    Patient Active Problem List   Diagnosis Date Noted   Obesity 03/28/2021   Unilateral primary osteoarthritis, left knee 03/28/2021   Thyroid  disease    Palpitations    Heart murmur    COVID-19 virus infection 02/2020   Arthritis    Anxiety    Severe aortic stenosis    Mild nonobstructive coronary artery disease    S/P AVR 03/12/2019   S/P AVR (aortic valve replacement) 03/12/2019   Intramural leiomyoma of uterus 04/27/2015   Hyperlipidemia 02/16/2015   Non-rheumatic mitral regurgitation 02/16/2015   Nonrheumatic aortic valve insufficiency 02/16/2015   Nonrheumatic aortic valve stenosis 02/16/2015   Endometrial polyp 01/14/2015   Hypothyroidism 12/21/2014   H/O vitamin D  deficiency 12/21/2014   Parathyroid abnormality (HCC) 07/22/2013   Multinodular goiter 07/22/2013   Family history of thyroid  disease 11/12/2012   Vaginal atrophy 11/12/2012   Elevated cholesterol    Osteopenia    Aortic insufficiency    Mitral valve regurgitation    Depression    Diverticulitis    Papilloma of breast     Past Surgical History:  Procedure Laterality Date   AORTIC VALVE REPLACEMENT N/A 03/12/2019   Procedure: AORTIC VALVE REPLACEMENT (AVR);  Surgeon: Lucas Dorise MARLA, MD;  Location: [facility] OR;  Service: Open Heart Surgery;  Laterality: N/A;   BIOPSY  THYROID   07/2013   BOWEL RESECTION     BREAST SURGERY     Left breast   CESAREAN SECTION     CHOLECYSTECTOMY     COLONOSCOPY  07/26/2014   Colonic polyps statuas post polypectomy. Mild neo diverticulosis. External hemorrhoids.    COLOSTOMY TAKEDOWN     CORONARY ANGIOPLASTY     FINGER SURGERY     LEFT HEART CATH AND CORONARY ANGIOGRAPHY N/A 02/13/2019   Procedure: LEFT HEART CATH AND CORONARY ANGIOGRAPHY;  Surgeon: Wonda Sharper, MD;  Location: [facility] INVASIVE CV LAB;  Service: Cardiovascular;  Laterality: N/A;   REPLACEMENT TOTAL KNEE Left 01/2022   TEE WITHOUT CARDIOVERSION N/A 03/12/2019    Procedure: TRANSESOPHAGEAL ECHOCARDIOGRAM (TEE);  Surgeon: Lucas Dorise POUR, MD;  Location: [facility] OR;  Service: Open Heart Surgery;  Laterality: N/A;   TUBAL LIGATION      OB History     Gravida  2   Para  1   Term  1   Preterm      AB  1   Living  1      SAB      IAB      Ectopic      Multiple      Live Births               Home Medications    Prior to Admission medications   Medication Sig Start Date End Date Taking? Authorizing Provider  aspirin  EC 81 MG tablet Take 81 mg by mouth daily. Swallow whole.    [provider]  buPROPion  (WELLBUTRIN  XL) 300 MG 24 hr tablet Take 300 mg by mouth daily.  11/12/17   [provider]  Calcium  Carbonate (CALCIUM  500 PO) Take 1 tablet by mouth daily.    [provider]  Calcium  Carbonate-Vitamin D  (CALCIUM  600/VITAMIN D  PO) Take 1 tablet by mouth daily.    [provider]  clonazePAM  (KLONOPIN ) 0.5 MG tablet Take 0.5 mg by mouth daily as needed for anxiety.     [provider]  ezetimibe  (ZETIA ) 10 MG tablet Take 1 tablet by mouth once daily. 09/05/23   Monetta Redell PARAS, MD  ibandronate  (BONIVA ) 150 MG tablet Take 1 tablet (150 mg total) by mouth every 30 (thirty) days. Take in the morning with a full glass of water, on an empty stomach, and do not take anything else by mouth or lie down for the next 30 min. 07/30/23   Prentiss Annabella LABOR, NP  ibuprofen (ADVIL) 200 MG tablet Take 200 mg by mouth daily as needed (knee pain).    [provider]  levothyroxine  (SYNTHROID , LEVOTHROID) 75 MCG tablet Take 1 tablet (75 mcg total) by mouth daily before breakfast. 06/29/17   Kassie Mallick, MD  Magnesium  500 MG TABS Take 500 mg by mouth daily.     [provider]  metoprolol  tartrate (LOPRESSOR ) 25 MG tablet Take 1/2 tablet by mouth twice daily. 08/14/23   Monetta Redell PARAS, MD  Omega-3 Fatty Acids (FISH OIL) 1000 MG CAPS Take 2,000 mg by mouth 2 (two) times daily.     [provider]  ondansetron  (ZOFRAN -ODT) 4 MG disintegrating tablet Take 1 tablet (4 mg total) by mouth every 8 (eight) hours as needed for nausea or vomiting. 11/02/22   Charlanne Groom, MD  pantoprazole  (PROTONIX ) 40 MG tablet Take 1 tablet by mouth once daily. 01/22/24   Charlanne Groom, MD  polyethylene glycol (MIRALAX  / GLYCOLAX ) 17 g packet Take 17  g by mouth daily.    [provider]  rosuvastatin  (CRESTOR ) 20 MG tablet Take 1 tablet (20 mg total) by mouth daily. 08/14/23   Monetta Redell PARAS, MD    Family History Family History  Problem Relation Age of Onset   Lung cancer Mother    Cancer Mother        lung-smoker   Heart disease Father    Breast cancer Maternal Aunt        age 68   Cancer Maternal Aunt        lymphoma   Cancer Maternal Uncle        lung   Cancer Maternal Grandmother        liver   Colon cancer Neg Hx    Esophageal cancer Neg Hx     Social History Social History   Tobacco Use   Smoking status: Former    Current packs/day: 0.00    Types: Cigarettes    Quit date: 07/19/2005    Years since quitting: 18.7   Smokeless tobacco: Never  Vaping Use   Vaping status: Never Used  Substance Use Topics   Alcohol use: No   Drug use: No     Allergies   Patient has no known allergies.   Review of Systems Review of Systems  Neurological:  Positive for dizziness.     Physical Exam Triage Vital Signs ED Triage Vitals  Encounter Vitals Group     BP 04/01/24 1513 108/80     Girls Systolic BP Percentile --      Girls Diastolic BP Percentile --      Boys Systolic BP Percentile --      Boys Diastolic BP Percentile --      Pulse Rate 04/01/24 1513 64     Resp 04/01/24 1513 20     Temp 04/01/24 1513 98.3 F (36.8 C)     Temp Source 04/01/24 1513 Oral     SpO2 04/01/24 1513 96 %     Weight --      Height --      Head Circumference --      Peak Flow --      Pain Score 04/01/24 1514 0     Pain Loc --      Pain Education --      Exclude from Growth  Chart --    No data found.  Updated Vital Signs BP 108/80 (BP Location: Right Arm)   Pulse 64   Temp 98.3 F (36.8 C) (Oral)   Resp 20   SpO2 96%   Visual Acuity Right Eye Distance:   Left Eye Distance:   Bilateral Distance:    Right Eye Near:   Left Eye Near:    Bilateral Near:     Physical Exam   UC Treatments / Results  Labs (all labs ordered are listed, but only abnormal results are displayed) Labs Reviewed - No data to display  EKG   Radiology No results found.  Procedures Procedures (including critical care time)  Medications Ordered in UC Medications - No data to display  Initial Impression / Assessment and Plan / UC Course  I have reviewed the triage vital signs and the nursing notes.  Pertinent labs & imaging results that were available during my care of the patient were reviewed by me and considered in my medical decision making (see chart for details).     *** Final Clinical Impressions(s) / UC Diagnoses   Final diagnoses:  Benign paroxysmal positional vertigo of right ear     Discharge Instructions      I have given you some information about vertigo.  Hopefully this will resolve.  He can use over-the-counter allergy medication for your congestion and sinus pressure. Follow-up with your doctor as needed for any continued issues   ED Prescriptions   None    PDMP not reviewed this encounter.

## 2024-04-08 DIAGNOSIS — E039 Hypothyroidism, unspecified: Secondary | ICD-10-CM | POA: Diagnosis not present

## 2024-04-08 DIAGNOSIS — Z23 Encounter for immunization: Secondary | ICD-10-CM | POA: Diagnosis not present

## 2024-04-08 DIAGNOSIS — F411 Generalized anxiety disorder: Secondary | ICD-10-CM | POA: Diagnosis not present

## 2024-04-08 DIAGNOSIS — R7303 Prediabetes: Secondary | ICD-10-CM | POA: Diagnosis not present

## 2024-04-08 DIAGNOSIS — E785 Hyperlipidemia, unspecified: Secondary | ICD-10-CM | POA: Diagnosis not present

## 2024-05-14 DIAGNOSIS — Z23 Encounter for immunization: Secondary | ICD-10-CM | POA: Diagnosis not present

## 2024-05-14 DIAGNOSIS — H8113 Benign paroxysmal vertigo, bilateral: Secondary | ICD-10-CM | POA: Diagnosis not present

## 2024-05-14 DIAGNOSIS — F411 Generalized anxiety disorder: Secondary | ICD-10-CM | POA: Diagnosis not present

## 2024-05-20 DIAGNOSIS — H8113 Benign paroxysmal vertigo, bilateral: Secondary | ICD-10-CM | POA: Diagnosis not present

## 2024-06-05 ENCOUNTER — Telehealth: Payer: Self-pay | Admitting: *Deleted

## 2024-06-05 NOTE — Telephone Encounter (Signed)
-----   Message from [place] S sent at 06/05/2024 12:46 PM EST ----- Regarding: order  I have scheduled a mammogram and bone density scan at [facility] on January 15 and they have requested that Tiffany send doctor's order for the scan. Time will need to be allowed for [facility] to send her results.

## 2024-06-05 NOTE — Telephone Encounter (Signed)
 OV 07/30/23 BMD 07/19/22, osteoporosis, on Boniva , repeat BMD in 56yrs  [facility] BMD order printed to be signed and faxed.

## 2024-06-09 NOTE — Telephone Encounter (Signed)
 Signed. Thank you.

## 2024-06-10 NOTE — Telephone Encounter (Signed)
 Patient notified BMD order sent.   Encounter closed.

## 2024-06-16 NOTE — Telephone Encounter (Signed)
 Order was faxed to [facility]. Confirmation was received.

## 2024-07-30 LAB — HM MAMMOGRAPHY

## 2024-07-30 LAB — HM DEXA SCAN

## 2024-07-31 ENCOUNTER — Other Ambulatory Visit: Payer: Self-pay

## 2024-07-31 DIAGNOSIS — M810 Age-related osteoporosis without current pathological fracture: Secondary | ICD-10-CM

## 2024-07-31 MED ORDER — IBANDRONATE SODIUM 150 MG PO TABS: 150.0000 mg | ORAL_TABLET | ORAL | 0 refills | Status: DC

## 2024-07-31 NOTE — Telephone Encounter (Signed)
 Med refill request: Boniva  150 mg Last AEX: 07/20/23 Next AEX: 08/12/24 Last MMG (if hormonal med) n/a DX: osteoporosis Last RF 05/02/24 Refill authorized: Boniva  150 mg  #1, zero refills.

## 2024-08-06 NOTE — Progress Notes (Unsigned)
 " Cardiology Office Note:    Date:  08/07/2024   ID:  Tammy Hunt, DOB March 21, 1946, MRN [id number]  PCP:  Conley Dene BROCKS., MD  Cardiologist:  Redell Leiter, MD    Referring MD: Dottie Norleen PHEBE PONCE, MD    ASSESSMENT:    1. S/P AVR (aortic valve replacement)   2. Mild nonobstructive coronary artery disease   3. Mixed hyperlipidemia    PLAN:    In order of problems listed above:  Genice continues to do well following surgical AVR [place]  Heart Association class I continue medical treatment including antiplatelet aspirin  and lipid-lowering rosuvastatin  Zetia  along with her beta-blocker Stable CAD no anginal discomfort on good medical therapy continue the above products Will continue her current lipid-lowering treatment   Next appointment: 1 year follow-up   Medication Adjustments/Labs and Tests Ordered: Current medicines are reviewed at length with the patient today.  Concerns regarding medicines are outlined above.  Orders Placed This Encounter  Procedures   EKG 12-Lead   No orders of the defined types were placed in this encounter.    History of Present Illness:    Tammy Hunt is a 79 y.o. female with a hx of symptomatic severe aortic stenosis surgical AVR #21 Edwards Inspira's Resilia pericardial valve August 2022 mild CAD and hyperlipidemia last seen 08/08/2023.  Compliance with diet, lifestyle and medications: Yes  Saw is nice to see Shulamis in the office she has done especially well since her valve surgery She had a surveillance echocardiogram last year at the 5-year anniversary She is not having cardiovascular symptoms of edema shortness of breath chest pain palpitation or syncope Unfortunately she has had vertigo and now has drainage from her ear likely external otitis and is being seen a little bit later this afternoon at her urgent care Laboratory studies performed September quite reassuring cholesterol 195 LDL 108 A1c 5.6 hemoglobin 12.6 creatinine 1.1  potassium 5.2 Past Medical History:  Diagnosis Date   Anxiety    Aortic insufficiency    Arthritis    COVID-19 virus infection 02/2020   had vaccine in March 2021 and breakthough in 02/2020 and the antibodiy infusion    Depression    Diverticulitis    Elevated cholesterol    Endometrial polyp 01/14/2015   Family history of thyroid  disease 11/12/2012   H/O vitamin D  deficiency 12/21/2014   Heart murmur    Hyperlipidemia    Hypothyroidism 12/21/2014   Intramural leiomyoma of uterus 04/27/2015   Mild nonobstructive coronary artery disease    (a) 02/13/19 cath with 40% stenosis to LAD and RCA - mild nonobstructive disease rec for medical mgmt   Mitral valve regurgitation    Multinodular goiter 07/22/2013   Non-rheumatic mitral regurgitation 02/16/2015   Overview:   Mild Aug 2016 echo     Nonrheumatic aortic valve insufficiency 02/16/2015   Overview:   Mild     Nonrheumatic aortic valve stenosis 02/16/2015   Obesity 03/28/2021   Osteopenia    Palpitations    Papilloma of breast    Parathyroid abnormality 07/22/2013   S/P AVR 03/12/2019   S/P AVR (aortic valve replacement) 03/12/2019   (a) SAVR with 21mm Edwards INSPIRIS RESILIA percardial valve   Severe aortic stenosis    s/p SAVR 03/12/19   Thyroid  disease    Unilateral primary osteoarthritis, left knee 03/28/2021   Vaginal atrophy 11/12/2012    Current Medications: Active Medications[1]    EKGs/Labs/Other Studies Reviewed:    The following studies were reviewed  today:  Cardiac Studies & Procedures   ______________________________________________________________________________________________ CARDIAC CATHETERIZATION  CARDIAC CATHETERIZATION 02/13/2019  Conclusion 1.  Mild diffuse calcific coronary artery disease, primarily affecting the LAD and RCA territories 2.  Severe aortic stenosis with peak instantaneous gradient 51 mmHg, peak to peak gradient 44 mmHg, mean gradient 33 mmHg  Recommendations:  Multidisciplinary heart valve team evaluation for treatment of severe symptomatic aortic stenosis.  Medical therapy for nonobstructive coronary artery disease.  Findings Coronary Findings Diagnostic  Dominance: Right  Left Main Vessel is moderate in size. There is mild diffuse disease throughout the vessel.  Left Anterior Descending Prox LAD to Mid LAD lesion is 40% stenosed. The lesion is calcified. There is mild diffuse calcific plaquing in the proximal LAD without high-grade obstruction.  Ramus Intermedius There is mild diffuse disease throughout the vessel.  Left Circumflex Vessel is normal in caliber. The vessel exhibits minimal luminal irregularities. The circumflex supplies to tortuous obtuse marginal branches with no obstruction  Right Coronary Artery Prox RCA lesion is 40% stenosed. The lesion is moderately calcified.  Intervention  No interventions have been documented.     ECHOCARDIOGRAM  ECHOCARDIOGRAM COMPLETE 01/29/2024  Narrative ECHOCARDIOGRAM REPORT    Patient Name:   Tammy Hunt [facility] Date of Exam: 01/29/2024 Medical Rec #:  [id number]     Height:       58.0 in Accession #:    [id number]    Weight:       237.2 lb Date of Birth:  02-Feb-1946     BSA:          1.958 m Patient Age:    79 years      BP:           134/66 mmHg Patient Gender: F             HR:           74 bpm. Exam Location:  [place]  Procedure: 2D Echo, Color Doppler, Cardiac Doppler and Strain Analysis (Both Spectral and Color Flow Doppler were utilized during procedure).  Indications:    S/P AVR (aortic valve replacement) [Z95.2 (ICD-10-CM)], Mild coronary artery disease [I25.10 (ICD-10-CM)], Mixed hyperlipidemia [E78.2 (ICD-10-CM)], Palpitations [R00.2 (ICD-10-CM)]  History:        Patient has prior history of Echocardiogram examinations, most recent 05/04/2021. S/P AVR, CAD, Signs/Symptoms:Murmur; Risk Factors:Dyslipidemia.  Sonographer:    Saddie Chimes Referring Phys: [id number]  Sharrell Krawiec J Jayleena Stille  IMPRESSIONS   1. Left ventricular ejection fraction, by estimation, is 60 to 65%. The left ventricle has normal function. The left ventricle has no regional wall motion abnormalities. There is mild left ventricular hypertrophy. Left ventricular diastolic parameters are consistent with Grade I diastolic dysfunction (impaired relaxation). The average left ventricular global longitudinal strain is -21.9 %. The global longitudinal strain is normal. 2. Right ventricular systolic function is normal. The right ventricular size is normal. There is normal pulmonary artery systolic pressure. 3. The mitral valve is normal in structure. No evidence of mitral valve regurgitation. No evidence of mitral stenosis. 4. The aortic valve is normal in structure. Aortic valve regurgitation is not visualized. Mild aortic valve stenosis. Aortic valve mean gradient measures 10.0 mmHg. 5. The inferior vena cava is normal in size with greater than 50% respiratory variability, suggesting right atrial pressure of 3 mmHg.  FINDINGS Left Ventricle: Left ventricular ejection fraction, by estimation, is 60 to 65%. The left ventricle has normal function. The left ventricle has no regional wall motion abnormalities. The average left ventricular global  longitudinal strain is -21.9 %. Strain was performed and the global longitudinal strain is normal. The left ventricular internal cavity size was normal in size. There is mild left ventricular hypertrophy. Left ventricular diastolic parameters are consistent with Grade I diastolic dysfunction (impaired relaxation).  Right Ventricle: The right ventricular size is normal. No increase in right ventricular wall thickness. Right ventricular systolic function is normal. There is normal pulmonary artery systolic pressure. The tricuspid regurgitant velocity is 1.51 m/s, and with an assumed right atrial pressure of 3 mmHg, the estimated right ventricular systolic pressure is 12.1  mmHg.  Left Atrium: Left atrial size was normal in size.  Right Atrium: Right atrial size was normal in size.  Pericardium: There is no evidence of pericardial effusion.  Mitral Valve: The mitral valve is normal in structure. No evidence of mitral valve regurgitation. No evidence of mitral valve stenosis. MV peak gradient, 4.0 mmHg. The mean mitral valve gradient is 1.0 mmHg.  Tricuspid Valve: The tricuspid valve is normal in structure. Tricuspid valve regurgitation is mild . No evidence of tricuspid stenosis.  Aortic Valve: The aortic valve is normal in structure. Aortic valve regurgitation is not visualized. Mild aortic stenosis is present. Aortic valve mean gradient measures 10.0 mmHg. Aortic valve peak gradient measures 20.7 mmHg. Aortic valve area, by VTI measures 2.15 cm.  Pulmonic Valve: The pulmonic valve was normal in structure. Pulmonic valve regurgitation is not visualized. No evidence of pulmonic stenosis.  Aorta: The aortic root is normal in size and structure.  Venous: The inferior vena cava is normal in size with greater than 50% respiratory variability, suggesting right atrial pressure of 3 mmHg.  IAS/Shunts: No atrial level shunt detected by color flow Doppler.   LEFT VENTRICLE PLAX 2D LVIDd:         5.20 cm   Diastology LVIDs:         3.10 cm   LV e' medial:    7.18 cm/s LV PW:         1.20 cm   LV E/e' medial:  9.6 LV IVS:        1.30 cm   LV e' lateral:   9.36 cm/s LVOT diam:     2.10 cm   LV E/e' lateral: 7.3 LV SV:         101 LV SV Index:   52        2D Longitudinal Strain LVOT Area:     3.46 cm  2D Strain GLS Avg:     -21.9 %   RIGHT VENTRICLE RV Basal diam:  2.60 cm RV Mid diam:    2.40 cm  LEFT ATRIUM           Index LA diam:      4.90 cm 2.50 cm/m LA Vol (A4C): 28.6 ml 14.61 ml/m AORTIC VALVE                     PULMONIC VALVE AV Area (Vmax):    1.68 cm      PV Vmax:       1.10 m/s AV Area (Vmean):   1.99 cm      PV Vmean:      80.700  cm/s AV Area (VTI):     2.15 cm      PV VTI:        0.240 m AV Vmax:           227.75 cm/s   PV Peak grad:  4.8 mmHg  AV Vmean:          141.000 cm/s  PV Mean grad:  3.0 mmHg AV VTI:            0.470 m AV Peak Grad:      20.7 mmHg AV Mean Grad:      10.0 mmHg LVOT Vmax:         110.50 cm/s LVOT Vmean:        80.950 cm/s LVOT VTI:          0.292 m LVOT/AV VTI ratio: 0.62  AORTA Ao Asc diam: 2.50 cm  MITRAL VALVE               TRICUSPID VALVE MV Area (PHT): 3.37 cm    TR Peak grad:   9.1 mmHg MV Area VTI:   2.08 cm    TR Vmax:        151.00 cm/s MV Peak grad:  4.0 mmHg MV Mean grad:  1.0 mmHg    SHUNTS MV Vmax:       1.00 m/s    Systemic VTI:  0.29 m MV Vmean:      50.6 cm/s   Systemic Diam: 2.10 cm MV Decel Time: 225 msec MV E velocity: 68.70 cm/s MV A velocity: 89.60 cm/s MV E/A ratio:  0.77  Lamar Fitch MD Electronically signed by Lamar Fitch MD Signature Date/Time: 01/30/2024/12:15:32 PM    Final   TEE  ECHO INTRAOPERATIVE TEE 03/12/2019  Narrative *INTRAOPERATIVE TRANSESOPHAGEAL REPORT *    Patient Name:   JAKKI DOUGHTY [facility]  Date of Exam: 03/12/2019 Medical Rec #:  [id number]      Height:       59.0 in Accession #:    [id number]     Weight:       222.4 lb Date of Birth:  1946/03/31      BSA:          1.93 m Patient Age:    72 years       BP:           175/77 mmHg Patient Gender: F              HR:           82 bpm. Exam Location:  Anesthesiology  Transesophogeal exam was perform intraoperatively during surgical procedure. Patient was closely monitored under general anesthesia during the entirety of examination.  Indications:     Aortic stenosis History:         Aortic Valve Disease and severe AS, MR Signs/Symptoms: Shortness of Breath Risk Factors: Hypertension. Sonographer:     Lyle Marc RDCS Performing Phys: [id number] DOR[address] POUR FELLERS Diagnosing Phys: Garnette Skillern MD  Complications: No known complications during this procedure. POST-OP  IMPRESSIONS - Left Ventricle: The left ventricle is unchanged from pre-bypass. - Aorta: The aorta appears unchanged from pre-bypass. - Left Atrial Appendage: The left atrial appendage appears unchanged from pre-bypass. - Aortic Valve: A 21 mm Edwards INSPIRIS RESILIA pericardial valve bioprosthetic valve was placed, leaflets are not freely mobile. No perivalvular leak noted. - Mitral Valve: The mitral valve appears unchanged from pre-bypass. - Tricuspid Valve: The tricuspid valve appears unchanged from pre-bypass. - Interatrial Septum: The interatrial septum appears unchanged from pre-bypass. - Pericardium: The pericardium appears unchanged from pre-bypass.  PRE-OP FINDINGS Left Ventricle: The left ventricle has normal systolic function, with an ejection fraction of 60-65%. The cavity size was normal. There is moderately increased left ventricular wall thickness.  Right Ventricle:  The right ventricle has normal systolic function. The cavity was normal. There is no increase in right ventricular wall thickness.  Left Atrium: Left atrial size was dilated. The left atrial appendage is well visualized and there is no evidence of thrombus present.  Right Atrium: Right atrial size was normal in size. Right atrial pressure is estimated at 10 mmHg.  Interatrial Septum: No atrial level shunt detected by color flow Doppler.  Pericardium: There is no evidence of pericardial effusion.  Mitral Valve: The mitral valve is normal in structure. Mitral valve regurgitation is trivial by color flow Doppler.  Tricuspid Valve: The tricuspid valve was normal in structure. Tricuspid valve regurgitation is mild by color flow Doppler.  Aortic Valve: The aortic valve is tricuspid There is Moderate calcification of the aortic valve Aortic valve regurgitation is mild by color flow Doppler. There is severe stenosis of the aortic valve.  Pulmonic Valve: The pulmonic valve was normal in structure. Pulmonic valve  regurgitation is trivial by color flow Doppler.   Aorta: The aortic root, ascending aorta and aortic arch are normal in size and structure.  +-------------+---------++ AORTIC VALVE           +-------------+---------++ AV Mean Grad:23.0 mmHg +-------------+---------++   Garnette Skillern MD Electronically signed by Garnette Skillern MD Signature Date/Time: 03/13/2019/8:03:34 PM    Final    CT SCANS  CT CORONARY MORPH W/CTA COR W/SCORE 02/20/2019  Addendum 02/20/2019  2:17 PM ADDENDUM REPORT: 02/20/2019 14:15  ADDENDUM: Reprocessed phases reviewed and measurements pertinent to valve sizing below  Min x Max diameter 23.8 x 20.3 mm  Area 375 mm2  Perimeter 70 mm  LM height 10.6 mm  RCA height 9.5 mm  LCS: 23.5 mm  RCS: 22.5 mm  RCS: 24 mm  Suggests that 23 mm Sapien 3 correct size   Electronically Signed By: Maude Emmer M.D. On: 02/20/2019 14:15  Addendum 02/20/2019  2:04 PM ADDENDUM REPORT: 02/20/2019 14:02  CLINICAL DATA:  Aortic stenosis  EXAM: Cardiac TAVR CT  TECHNIQUE: The patient was scanned on a Siemens Force 192 slice scanner. A 120 kV retrospective scan was triggered in the descending thoracic aorta at 111 HU's. Gantry rotation speed was 270 msecs and collimation was .9 mm. No beta blockade or nitro were given. The 3D data set was reconstructed in 5% intervals of the R-R cycle. Systolic and diastolic phases were analyzed on a dedicated work station using MPR, MIP and VRT modes. The patient received 80 cc of contrast.  FINDINGS: Aortic Valve: The images were extremely noisy Scan may need to be repeated having tech reprocess Tri leaflet valve with moderate calcification particularly the non coronary cusp  Aorta: No aneurysm  Sinotubular Junction: 23 mm  Ascending Thoracic Aorta: 33 mm  Aortic Arch: 24 mm  Descending Thoracic Aorta: 23 mm  Sinus of Valsalva Measurements:  Non-coronary: 24 mm  Right - coronary: 22 mm  Left -  coronary: 23 mm  Coronary Artery Height above Annulus:  Left Main: 10.7 mm above annulus  Right Coronary: 10.5 mm above annulus  Virtual Basal Annulus Measurements:  Maximum/Minimum Diameter: 23.5 mm x 20.9 mm  Perimeter: 69 mm  Area: 348-378 mm2  Coronary Arteries: Sufficient height above annulus for deployment  Optimum Fluoroscopic Angle for Delivery: LAO 7 Caudal 6 degrees  IMPRESSION: 1. Sub-optimal scan with lot of noise. Study to be reprocessed may need to be re-scanned  2. Tri leaflet AV with annular area of 348-378 mm2 suitable for 23 Sapien 3  but sinuses appear small  3.  Coronary arteries suitable height above annulus for deployment  4.  Optimum angiographic angle for deployment LAO 7 Caudal 6 degrees  5.  Normal aortic root 3.3 cm  Maude Emmer   Electronically Signed By: Maude Emmer M.D. On: 02/20/2019 14:02  Narrative EXAM: OVER-READ INTERPRETATION  CT CHEST  The following report is an over-read performed by radiologist Dr. Toribio Aye of [facility] on 02/20/2019. This over-read does not include interpretation of cardiac or coronary anatomy or pathology. The coronary calcium  score/coronary CTA interpretation by the cardiologist is attached.  COMPARISON:  None.  FINDINGS: Extracardiac findings will be described separately under dictation for contemporaneously obtained CTA chest, abdomen and pelvis.  IMPRESSION: Please see separate dictation for contemporaneously obtained CTA chest, abdomen and pelvis dated 02/20/2019 for full description of relevant extracardiac findings.  Electronically Signed: By: Toribio Aye M.D. On: 02/20/2019 11:55     ______________________________________________________________________________________________      EKG Interpretation Date/Time:  Friday August 07 2024 15:05:43 EST Ventricular Rate:  56 PR Interval:  180 QRS Duration:  88 QT Interval:  440 QTC Calculation: 424 R  Axis:   4  Text Interpretation: Sinus bradycardia When compared with ECG of 08-Aug-2023 15:24, No significant change was found Confirmed by Monetta Rogue ([id number]) on 08/07/2024 3:13:02 PM   Recent Labs: No results found for requested labs within last 365 days.  Recent Lipid Panel    Component Value Date/Time   CHOL 196 07/20/2022 1616   TRIG 163 (H) 07/20/2022 1616   HDL 60 07/20/2022 1616   CHOLHDL 3.3 07/20/2022 1616   LDLCALC 108 (H) 07/20/2022 1616    Physical Exam:    VS:  BP 100/64   Pulse (!) 56   Ht 4' 10 (1.473 m)   Wt 230 lb 3.2 oz (104.4 kg)   SpO2 95%   BMI 48.11 kg/m     Wt Readings from Last 3 Encounters:  08/07/24 230 lb 3.2 oz (104.4 kg)  08/08/23 237 lb 3.2 oz (107.6 kg)  07/30/23 235 lb (106.6 kg)     GEN:  Well nourished, well developed in no acute distress HEENT: Normal NECK: No JVD; No carotid bruits LYMPHATICS: No lymphadenopathy CARDIAC: Grade 1/6 soft ejection murmur physiologic RRR, RESPIRATORY:  Clear to auscultation without rales, wheezing or rhonchi  ABDOMEN: Soft, non-tender, non-distended MUSCULOSKELETAL:  No edema; No deformity  SKIN: Warm and dry NEUROLOGIC:  Alert and oriented x 3 PSYCHIATRIC:  Normal affect    Signed, Rogue Monetta, MD  08/07/2024 3:21 PM    [facility]      [1]  Current Meds  Medication Sig   aspirin  EC 81 MG tablet Take 81 mg by mouth daily. Swallow whole.   buPROPion  (WELLBUTRIN  XL) 300 MG 24 hr tablet Take 300 mg by mouth daily.    Calcium  Carbonate (CALCIUM  500 PO) Take 1 tablet by mouth daily.   Calcium  Carbonate-Vitamin D  (CALCIUM  600/VITAMIN D  PO) Take 1 tablet by mouth daily.   clonazePAM  (KLONOPIN ) 0.5 MG tablet Take 0.5 mg by mouth daily as needed for anxiety.    escitalopram (LEXAPRO) 10 MG tablet Take 10 mg by mouth daily.   ezetimibe  (ZETIA ) 10 MG tablet Take 1 tablet by mouth once daily.   ibandronate  (BONIVA ) 150 MG tablet Take 1 tablet (150 mg total) by mouth every 30  (thirty) days. Take in the morning with a full glass of water, on an empty stomach, and  do not take anything else by mouth or lie down for the next 30 min.   ibuprofen (ADVIL) 200 MG tablet Take 200 mg by mouth daily as needed (knee pain).   levothyroxine  (SYNTHROID , LEVOTHROID) 75 MCG tablet Take 1 tablet (75 mcg total) by mouth daily before breakfast.   Magnesium  500 MG TABS Take 500 mg by mouth daily.    metoprolol  tartrate (LOPRESSOR ) 25 MG tablet Take 1/2 tablet by mouth twice daily.   Omega-3 Fatty Acids (FISH OIL) 1000 MG CAPS Take 2,000 mg by mouth 2 (two) times daily.    ondansetron  (ZOFRAN -ODT) 4 MG disintegrating tablet Take 1 tablet (4 mg total) by mouth every 8 (eight) hours as needed for nausea or vomiting.   pantoprazole  (PROTONIX ) 40 MG tablet Take 1 tablet by mouth once daily.   polyethylene glycol (MIRALAX  / GLYCOLAX ) 17 g packet Take 17 g by mouth daily.   rosuvastatin  (CRESTOR ) 20 MG tablet Take 1 tablet (20 mg total) by mouth daily.   "

## 2024-08-07 ENCOUNTER — Ambulatory Visit: Attending: Cardiology | Admitting: Cardiology

## 2024-08-07 ENCOUNTER — Encounter: Payer: Self-pay | Admitting: Cardiology

## 2024-08-07 VITALS — BP 100/64 | HR 56 | Ht <= 58 in | Wt 230.2 lb

## 2024-08-07 DIAGNOSIS — I2510 Atherosclerotic heart disease of native coronary artery without angina pectoris: Secondary | ICD-10-CM | POA: Diagnosis not present

## 2024-08-07 DIAGNOSIS — E782 Mixed hyperlipidemia: Secondary | ICD-10-CM | POA: Diagnosis not present

## 2024-08-07 DIAGNOSIS — Z952 Presence of prosthetic heart valve: Secondary | ICD-10-CM | POA: Diagnosis not present

## 2024-08-07 NOTE — Patient Instructions (Signed)

## 2024-08-12 ENCOUNTER — Ambulatory Visit: Admitting: Nurse Practitioner

## 2024-08-12 ENCOUNTER — Ambulatory Visit: Payer: Self-pay | Admitting: Nurse Practitioner

## 2024-08-12 ENCOUNTER — Other Ambulatory Visit: Payer: Self-pay | Admitting: Nurse Practitioner

## 2024-08-12 ENCOUNTER — Encounter: Payer: Self-pay | Admitting: Nurse Practitioner

## 2024-08-12 VITALS — BP 112/78 | HR 75 | Ht <= 58 in | Wt 227.0 lb

## 2024-08-12 DIAGNOSIS — Z780 Asymptomatic menopausal state: Secondary | ICD-10-CM | POA: Diagnosis not present

## 2024-08-12 DIAGNOSIS — M810 Age-related osteoporosis without current pathological fracture: Secondary | ICD-10-CM

## 2024-08-12 DIAGNOSIS — Z01419 Encounter for gynecological examination (general) (routine) without abnormal findings: Secondary | ICD-10-CM

## 2024-08-12 MED ORDER — IBANDRONATE SODIUM 150 MG PO TABS: 150.0000 mg | ORAL_TABLET | ORAL | 3 refills | Status: AC

## 2024-08-12 NOTE — Progress Notes (Signed)
 "  Tammy Hunt 04/30/1946 [id number]   History:  79 y.o. G2P1001 presents for breast and pelvic exam without GYN complaints. Postmenopausal - no HRT, no bleeding. History of endometrial polyp, fibroids, osteoporosis. Started Boniva  Jan 2024 with much improvement in bone density. DXA 07/2024 T-score -2.1. (-2.7 in 2023) PCP manages hypothyroidism, HLD, vitamin D  deficiency. Aortic valve replacement 03/12/2019, followed by cardiology. Colectomy with ostomy in the past for hernia and adhesions, reversed, followed by GI.  Gynecologic History No LMP recorded. Patient is postmenopausal.   Contraception: post menopausal status Sexually active: No  Health maintenance Last Pap: 12/21/2014. Results were: Normal Last mammogram: 07/30/2024. Results were: Normal Last colonoscopy: 07/19/2020. Results were: Tubular adenoma, no longer screening Last Dexa: 07/30/2024. Results were: T-score -2.1  Past medical history, past surgical history, family history and social history were all reviewed and documented in the EPIC chart. Married. Retired from state. Has 1 son, married with 35 and 53 yo sons.   ROS:  A ROS was performed and pertinent positives and negatives are included.  Exam:  Vitals:   08/12/24 1407  BP: 112/78  Pulse: 75  SpO2: 98%  Weight: 227 lb (103 kg)  Height: 4' 9 (1.448 m)     Body mass index is 49.12 kg/m.  General appearance:  Normal Thyroid :  Symmetrical, normal in size, without palpable masses or nodularity. Respiratory  Auscultation:  Clear without wheezing or rhonchi Cardiovascular  Auscultation:  Regular rate, without rubs, murmurs or gallops  Edema/varicosities:  Not grossly evident Abdominal  Soft,nontender, without masses, guarding or rebound.  Liver/spleen:  No organomegaly noted  Hernia:  None appreciated  Skin  Inspection:  Grossly normal Breasts: Examined lying and sitting.   Right: Without masses, retractions, nipple discharge or axillary  adenopathy.   Left: Without masses, retractions, nipple discharge or axillary adenopathy. Pelvic: External genitalia:  no lesions              Urethra:  normal appearing urethra with no masses, tenderness or lesions              Bartholins and Skenes: normal                 Vagina: normal appearing vagina with normal color and discharge, no lesions. Atrophic changes              Cervix: no lesions Bimanual Exam:  Uterus:  no masses or tenderness              Adnexa: no mass, fullness, tenderness              Rectovaginal: Deferred              Anus:  normal, no lesions  Dereck Keas, CMA present as chaperone.   Assessment/Plan:  79 y.o. G2 P1 for breast and pelvic exam.  Encounter for breast and pelvic examination - Education provided on SBEs, importance of preventative screenings, current guidelines, high calcium  diet, daily vitamin D  supplement, regular exercise, and multivitamin daily.  Labs with PCP.   Postmenopausal - no HRT, no bleeding.   Age-related osteoporosis without current pathological fracture - Plan: ibandronate  (BONIVA ) 150 MG tablet every 30 days. Much improvement since starting Boniva  Jan 2024. 07/30/2024 T-score -2.1 (-2.7 in 2023). Will repeat DXA in 2 years.   Screening for cervical cancer - Normal Pap history.  No longer screening per guidelines.   Screening for breast cancer - Normal mammogram history.  Continue annual screenings.  Normal breast exam  today.  Screening for colon cancer - 07/2020 colonoscopy. No longer screening per GI recommendation.   Return in about 1 year (around 08/12/2025) for Med follow up.        Tammy Hunt [facility], 2:37 PM 08/12/2024 "
